# Patient Record
Sex: Female | Born: 1955 | Race: White | Hispanic: No | Smoking: Former smoker
Health system: Southern US, Community
[De-identification: ages and names within clinical notes are randomized; demographics above are authoritative.]

## PROBLEM LIST (undated history)

## (undated) DIAGNOSIS — F32A Depression, unspecified: Secondary | ICD-10-CM

## (undated) DIAGNOSIS — F449 Dissociative and conversion disorder, unspecified: Secondary | ICD-10-CM

## (undated) DIAGNOSIS — K859 Acute pancreatitis without necrosis or infection, unspecified: Secondary | ICD-10-CM

## (undated) DIAGNOSIS — E7800 Pure hypercholesterolemia, unspecified: Secondary | ICD-10-CM

## (undated) DIAGNOSIS — F419 Anxiety disorder, unspecified: Secondary | ICD-10-CM

## (undated) DIAGNOSIS — F329 Major depressive disorder, single episode, unspecified: Secondary | ICD-10-CM

## (undated) DIAGNOSIS — F0781 Postconcussional syndrome: Secondary | ICD-10-CM

## (undated) DIAGNOSIS — H269 Unspecified cataract: Secondary | ICD-10-CM

## (undated) DIAGNOSIS — E039 Hypothyroidism, unspecified: Secondary | ICD-10-CM

## (undated) DIAGNOSIS — E119 Type 2 diabetes mellitus without complications: Secondary | ICD-10-CM

## (undated) DIAGNOSIS — T7840XA Allergy, unspecified, initial encounter: Secondary | ICD-10-CM

## (undated) DIAGNOSIS — K219 Gastro-esophageal reflux disease without esophagitis: Secondary | ICD-10-CM

## (undated) HISTORY — DX: Unspecified cataract: H26.9

## (undated) HISTORY — DX: Gastro-esophageal reflux disease without esophagitis: K21.9

## (undated) HISTORY — DX: Anxiety disorder, unspecified: F41.9

## (undated) HISTORY — DX: Allergy, unspecified, initial encounter: T78.40XA

## (undated) HISTORY — PX: OVARY SURGERY: SHX727

## (undated) HISTORY — PX: CHOLECYSTECTOMY: SHX55

## (undated) HISTORY — DX: Type 2 diabetes mellitus without complications: E11.9

## (undated) HISTORY — DX: Depression, unspecified: F32.A

## (undated) HISTORY — DX: Major depressive disorder, single episode, unspecified: F32.9

---

## 2016-12-09 ENCOUNTER — Encounter (HOSPITAL_COMMUNITY): Payer: Self-pay | Admitting: Emergency Medicine

## 2016-12-09 ENCOUNTER — Inpatient Hospital Stay (HOSPITAL_COMMUNITY)
Admission: EM | Admit: 2016-12-09 | Discharge: 2016-12-16 | DRG: 417 | Disposition: A | Discharge status: Home Health | Payer: Self-pay | Attending: Internal Medicine | Admitting: Internal Medicine

## 2016-12-09 DIAGNOSIS — D252 Subserosal leiomyoma of uterus: Secondary | ICD-10-CM | POA: Diagnosis present

## 2016-12-09 DIAGNOSIS — Z23 Encounter for immunization: Secondary | ICD-10-CM

## 2016-12-09 DIAGNOSIS — K838 Other specified diseases of biliary tract: Secondary | ICD-10-CM

## 2016-12-09 DIAGNOSIS — E785 Hyperlipidemia, unspecified: Secondary | ICD-10-CM | POA: Diagnosis present

## 2016-12-09 DIAGNOSIS — Z419 Encounter for procedure for purposes other than remedying health state, unspecified: Secondary | ICD-10-CM

## 2016-12-09 DIAGNOSIS — K8050 Calculus of bile duct without cholangitis or cholecystitis without obstruction: Secondary | ICD-10-CM

## 2016-12-09 DIAGNOSIS — K8000 Calculus of gallbladder with acute cholecystitis without obstruction: Secondary | ICD-10-CM

## 2016-12-09 DIAGNOSIS — Z880 Allergy status to penicillin: Secondary | ICD-10-CM

## 2016-12-09 DIAGNOSIS — F329 Major depressive disorder, single episode, unspecified: Secondary | ICD-10-CM | POA: Diagnosis present

## 2016-12-09 DIAGNOSIS — K219 Gastro-esophageal reflux disease without esophagitis: Secondary | ICD-10-CM | POA: Diagnosis present

## 2016-12-09 DIAGNOSIS — K859 Acute pancreatitis without necrosis or infection, unspecified: Secondary | ICD-10-CM | POA: Diagnosis present

## 2016-12-09 DIAGNOSIS — Z79890 Hormone replacement therapy (postmenopausal): Secondary | ICD-10-CM

## 2016-12-09 DIAGNOSIS — L0291 Cutaneous abscess, unspecified: Secondary | ICD-10-CM

## 2016-12-09 DIAGNOSIS — K8067 Calculus of gallbladder and bile duct with acute and chronic cholecystitis with obstruction: Secondary | ICD-10-CM | POA: Diagnosis present

## 2016-12-09 DIAGNOSIS — F419 Anxiety disorder, unspecified: Secondary | ICD-10-CM | POA: Diagnosis present

## 2016-12-09 DIAGNOSIS — K920 Hematemesis: Secondary | ICD-10-CM | POA: Diagnosis present

## 2016-12-09 DIAGNOSIS — R17 Unspecified jaundice: Secondary | ICD-10-CM

## 2016-12-09 DIAGNOSIS — K851 Biliary acute pancreatitis without necrosis or infection: Principal | ICD-10-CM | POA: Diagnosis present

## 2016-12-09 DIAGNOSIS — Z6831 Body mass index (BMI) 31.0-31.9, adult: Secondary | ICD-10-CM

## 2016-12-09 DIAGNOSIS — E039 Hypothyroidism, unspecified: Secondary | ICD-10-CM | POA: Diagnosis present

## 2016-12-09 DIAGNOSIS — E669 Obesity, unspecified: Secondary | ICD-10-CM | POA: Diagnosis present

## 2016-12-09 DIAGNOSIS — K9189 Other postprocedural complications and disorders of digestive system: Secondary | ICD-10-CM | POA: Diagnosis not present

## 2016-12-09 HISTORY — DX: Hypothyroidism, unspecified: E03.9

## 2016-12-09 MED ORDER — SODIUM CHLORIDE 0.9 % IV BOLUS (SEPSIS)
1000.0000 mL | Freq: Once | INTRAVENOUS | Status: AC
  Administered 2016-12-10: 1000 mL via INTRAVENOUS

## 2016-12-09 MED ORDER — ONDANSETRON 4 MG PO TBDP
4.0000 mg | ORAL_TABLET | Freq: Once | ORAL | Status: AC | PRN
  Administered 2016-12-09: 4 mg via ORAL
  Filled 2016-12-09: qty 1

## 2016-12-09 NOTE — ED Provider Notes (Signed)
[facility] DEPT Provider Note   CSN: [id number] Arrival date & time: 12/09/16  2134     History   Chief Complaint Chief Complaint  Patient presents with  . Weakness  . Jaundice    HPI Andrea Blackwell is a 61 y.o. female here presenting with jaundice, abdominal pain, itchiness.  For the last 2 weeks, patient has progressive jaundice as well as diffuse itchiness.  She also lost about 15 pounds that is unintentional.  Patient saw primary care doctor about a week ago and finished a course of Bactrim.  Patient was also started on Lexapro over the last week or so.  Patient has some subjective shortness of breath as well but denies any vomiting or fevers.  Patient denies any recent travel or bad food.  Patient has no history of cancer in the past.  The history is provided by the patient.    History reviewed. No pertinent past medical history.  There are no active problems to display for this patient.   Past Surgical History:  Procedure Laterality Date  . OVARY SURGERY      OB History    No data available       Home Medications    Prior to Admission medications   Not on File    Family History History reviewed. No pertinent family history.  Social History Social History  Substance Use Topics  . Smoking status: Never Smoker  . Smokeless tobacco: Never Used  . Alcohol use No     Allergies   Penicillins   Review of Systems Review of Systems  Gastrointestinal: Positive for abdominal pain.  Neurological: Positive for weakness.  All other systems reviewed and are negative.    Physical Exam Updated Vital Signs BP 139/74 (BP Location: Left Arm)   Pulse 69   Temp 97.9 F (36.6 C) (Oral)   Resp 20   Ht 5' 6.5" (1.689 m)   Wt 90.3 kg (199 lb)   SpO2 96%   BMI 31.64 kg/m   Physical Exam  Constitutional: She is oriented to person, place, and time. She appears well-developed.  HENT:  Head: Normocephalic.  Mouth/Throat: Oropharynx  is clear and moist.  Eyes: Pupils are equal, round, and reactive to light.  Jaundice   Neck: Normal range of motion. Neck supple.  Cardiovascular: Normal rate, regular rhythm and normal heart sounds.   Pulmonary/Chest: Effort normal and breath sounds normal. No respiratory distress. She has no wheezes. She has no rales.  Abdominal: Soft.  + epigastric tenderness, no murphy's sign   Musculoskeletal: Normal range of motion.  Neurological: She is alert and oriented to person, place, and time. No cranial nerve deficit. Coordination normal.  Skin: Skin is warm.  Psychiatric: She has a normal mood and affect.  Nursing note and vitals reviewed.    ED Treatments / Results  Labs (all labs ordered are listed, but only abnormal results are displayed) Labs Reviewed  LIPASE, BLOOD  COMPREHENSIVE METABOLIC PANEL  CBC  URINALYSIS, ROUTINE W REFLEX MICROSCOPIC    EKG  EKG Interpretation None       Radiology No results found.  Procedures Procedures (including critical care time)  Medications Ordered in ED Medications  sodium chloride 0.9 % bolus 1,000 mL (not administered)  ondansetron (ZOFRAN-ODT) disintegrating tablet 4 mg (4 mg Oral Given 12/09/16 2347)     Initial Impression / Assessment and Plan / ED Course  I have reviewed the triage vital signs and the nursing notes.  Pertinent  labs & imaging results that were available during my care of the patient were reviewed by me and considered in my medical decision making (see chart for details).     Andrea Blackwell is a 61 y.o. female here with abdominal pain, jaundice, weight loss. Concerned for possible cholangiocarcinoma vs pancreatic cancer, less likely hepatitis or drug reaction from lexapro or bactrim. Will get CBC, CMP, CT ab/pel, UA.   3:21 AM AST 228, ALT 505, Alk Phos 281, Bili 13. Lipase 2000. CT showed choledocholithiasis with dilated CBD and intra hepatic biliary dilation. Likely had choledocholithiasis with pancreatitis.  Hospitalist to admit. Dr. Benson Norway from GI consulted regarding ERCP and he will see patient in AM. Will keep NPO.   Final Clinical Impressions(s) / ED Diagnoses   Final diagnoses:  None    New Prescriptions New Prescriptions   No medications on file     Drenda Freeze, MD 12/10/16 [phone]

## 2016-12-09 NOTE — ED Triage Notes (Signed)
Patient is complaining of vomiting blood and urinating blood. Patient has pain in abdominal area. Patient states she is weak and nauseas. Patient skins is yellow. Patient also has pain her lower back.

## 2016-12-10 ENCOUNTER — Emergency Department (HOSPITAL_COMMUNITY): Payer: Self-pay

## 2016-12-10 ENCOUNTER — Encounter (HOSPITAL_COMMUNITY): Payer: Self-pay | Admitting: Radiology

## 2016-12-10 ENCOUNTER — Inpatient Hospital Stay (HOSPITAL_COMMUNITY): Payer: Self-pay | Admitting: Certified Registered Nurse Anesthetist

## 2016-12-10 ENCOUNTER — Inpatient Hospital Stay (HOSPITAL_COMMUNITY): Payer: Self-pay

## 2016-12-10 ENCOUNTER — Encounter (HOSPITAL_COMMUNITY)
Admission: EM | Disposition: A | Discharge status: Home Health | Payer: Self-pay | Source: Home / Self Care | Attending: Internal Medicine

## 2016-12-10 DIAGNOSIS — K851 Biliary acute pancreatitis without necrosis or infection: Principal | ICD-10-CM

## 2016-12-10 DIAGNOSIS — K859 Acute pancreatitis without necrosis or infection, unspecified: Secondary | ICD-10-CM | POA: Diagnosis present

## 2016-12-10 HISTORY — PX: ERCP: SHX5425

## 2016-12-10 LAB — URINALYSIS, ROUTINE W REFLEX MICROSCOPIC
GLUCOSE, UA: NEGATIVE mg/dL
KETONES UR: NEGATIVE mg/dL
NITRITE: NEGATIVE
PH: 5 (ref 5.0–8.0)
Protein, ur: NEGATIVE mg/dL
Specific Gravity, Urine: 1.011 (ref 1.005–1.030)

## 2016-12-10 LAB — COMPREHENSIVE METABOLIC PANEL
ALBUMIN: 3.6 g/dL (ref 3.5–5.0)
ALT: 384 U/L — AB (ref 14–54)
ALT: 505 U/L — ABNORMAL HIGH (ref 14–54)
ANION GAP: 10 (ref 5–15)
AST: 175 U/L — AB (ref 15–41)
AST: 228 U/L — ABNORMAL HIGH (ref 15–41)
Albumin: 2.8 g/dL — ABNORMAL LOW (ref 3.5–5.0)
Alkaline Phosphatase: 228 U/L — ABNORMAL HIGH (ref 38–126)
Alkaline Phosphatase: 281 U/L — ABNORMAL HIGH (ref 38–126)
Anion gap: 9 (ref 5–15)
BILIRUBIN TOTAL: 13.6 mg/dL — AB (ref 0.3–1.2)
BILIRUBIN TOTAL: 9.5 mg/dL — AB (ref 0.3–1.2)
BUN: 12 mg/dL (ref 6–20)
BUN: 9 mg/dL (ref 6–20)
CALCIUM: 9.6 mg/dL (ref 8.9–10.3)
CO2: 23 mmol/L (ref 22–32)
CO2: 24 mmol/L (ref 22–32)
CREATININE: 0.4 mg/dL — AB (ref 0.44–1.00)
Calcium: 8.7 mg/dL — ABNORMAL LOW (ref 8.9–10.3)
Chloride: 103 mmol/L (ref 101–111)
Chloride: 107 mmol/L (ref 101–111)
Creatinine, Ser: 0.36 mg/dL — ABNORMAL LOW (ref 0.44–1.00)
GFR calc Af Amer: >60 mL/min (ref >60)
GFR calc non Af Amer: >60 mL/min (ref >60)
Glucose, Bld: 147 mg/dL — ABNORMAL HIGH (ref 65–99)
Glucose, Bld: 94 mg/dL (ref 65–99)
Potassium: 3.8 mmol/L (ref 3.5–5.1)
Potassium: 4.1 mmol/L (ref 3.5–5.1)
SODIUM: 136 mmol/L (ref 135–145)
Sodium: 140 mmol/L (ref 135–145)
TOTAL PROTEIN: 5.9 g/dL — AB (ref 6.5–8.1)
TOTAL PROTEIN: 7.3 g/dL (ref 6.5–8.1)

## 2016-12-10 LAB — CBG MONITORING, ED: GLUCOSE-CAPILLARY: 87 mg/dL (ref 65–99)

## 2016-12-10 LAB — CBC
HCT: 36.5 % (ref 36.0–46.0)
HCT: 36.9 % (ref 36.0–46.0)
HCT: 43.1 % (ref 36.0–46.0)
HEMOGLOBIN: 14.4 g/dL (ref 12.0–15.0)
Hemoglobin: 12.2 g/dL (ref 12.0–15.0)
Hemoglobin: 12.2 g/dL (ref 12.0–15.0)
MCH: 30.4 pg (ref 26.0–34.0)
MCH: 30.6 pg (ref 26.0–34.0)
MCH: 30.7 pg (ref 26.0–34.0)
MCHC: 33.1 g/dL (ref 30.0–36.0)
MCHC: 33.4 g/dL (ref 30.0–36.0)
MCHC: 33.4 g/dL (ref 30.0–36.0)
MCV: 91.5 fL (ref 78.0–100.0)
MCV: 91.7 fL (ref 78.0–100.0)
MCV: 92 fL (ref 78.0–100.0)
PLATELETS: 223 10*3/uL (ref 150–400)
Platelets: 221 10*3/uL (ref 150–400)
Platelets: 280 10*3/uL (ref 150–400)
RBC: 3.98 MIL/uL (ref 3.87–5.11)
RBC: 4.01 MIL/uL (ref 3.87–5.11)
RBC: 4.71 MIL/uL (ref 3.87–5.11)
RDW: 14.2 % (ref 11.5–15.5)
RDW: 14.3 % (ref 11.5–15.5)
RDW: 14.5 % (ref 11.5–15.5)
WBC: 3.8 10*3/uL — AB (ref 4.0–10.5)
WBC: 4.9 10*3/uL (ref 4.0–10.5)
WBC: 8.7 10*3/uL (ref 4.0–10.5)

## 2016-12-10 LAB — LACTATE DEHYDROGENASE: LDH: 152 U/L (ref 98–192)

## 2016-12-10 LAB — TSH: TSH: 0.578 u[IU]/mL (ref 0.350–4.500)

## 2016-12-10 LAB — ACETAMINOPHEN LEVEL: Acetaminophen (Tylenol), Serum: <10 ug/mL — ABNORMAL LOW (ref 10–30)

## 2016-12-10 LAB — LIPID PANEL
Cholesterol: 279 mg/dL — ABNORMAL HIGH (ref 0–200)
HDL: 21 mg/dL — ABNORMAL LOW (ref >40)
LDL CALC: 216 mg/dL — AB (ref 0–99)
Total CHOL/HDL Ratio: 13.3 RATIO
Triglycerides: 209 mg/dL — ABNORMAL HIGH (ref <150)
VLDL: 42 mg/dL — AB (ref 0–40)

## 2016-12-10 LAB — SAMPLE TO BLOOD BANK

## 2016-12-10 LAB — ETHANOL: Alcohol, Ethyl (B): <10 mg/dL (ref <10)

## 2016-12-10 LAB — SALICYLATE LEVEL: Salicylate Lvl: <7 mg/dL (ref 2.8–30.0)

## 2016-12-10 LAB — LIPASE, BLOOD
LIPASE: 2778 U/L — AB (ref 11–51)
LIPASE: 1764 U/L — AB (ref 11–51)

## 2016-12-10 LAB — TROPONIN I: Troponin I: <0.03 ng/mL (ref <0.03)

## 2016-12-10 LAB — PROTIME-INR
INR: 0.93
PROTHROMBIN TIME: 12.4 s (ref 11.4–15.2)

## 2016-12-10 SURGERY — ERCP, WITH INTERVENTION IF INDICATED
Anesthesia: General

## 2016-12-10 MED ORDER — MORPHINE SULFATE (PF) 4 MG/ML IV SOLN
4.0000 mg | Freq: Once | INTRAVENOUS | Status: AC
  Administered 2016-12-10: 4 mg via INTRAVENOUS
  Filled 2016-12-10: qty 1

## 2016-12-10 MED ORDER — ONDANSETRON HCL 4 MG/2ML IJ SOLN
INTRAMUSCULAR | Status: DC | PRN
  Administered 2016-12-10: 4 mg via INTRAVENOUS

## 2016-12-10 MED ORDER — DEXAMETHASONE SODIUM PHOSPHATE 10 MG/ML IJ SOLN
INTRAMUSCULAR | Status: AC
  Filled 2016-12-10: qty 1

## 2016-12-10 MED ORDER — SUCCINYLCHOLINE CHLORIDE 200 MG/10ML IV SOSY
PREFILLED_SYRINGE | INTRAVENOUS | Status: DC | PRN
  Administered 2016-12-10: 100 mg via INTRAVENOUS

## 2016-12-10 MED ORDER — SODIUM CHLORIDE 0.9 % IV SOLN
INTRAVENOUS | Status: AC
  Administered 2016-12-10 – 2016-12-12 (×7): via INTRAVENOUS

## 2016-12-10 MED ORDER — PANTOPRAZOLE SODIUM 40 MG IV SOLR
80.0000 mg | Freq: Once | INTRAVENOUS | Status: AC
  Administered 2016-12-10: 10:00:00 80 mg via INTRAVENOUS
  Filled 2016-12-10: qty 80

## 2016-12-10 MED ORDER — SODIUM CHLORIDE 0.9 % IV SOLN: INTRAVENOUS | Status: DC

## 2016-12-10 MED ORDER — IOPAMIDOL (ISOVUE-300) INJECTION 61%
INTRAVENOUS | Status: AC
  Filled 2016-12-10: qty 100

## 2016-12-10 MED ORDER — ONDANSETRON HCL 4 MG/2ML IJ SOLN: 4.0000 mg | Freq: Once | INTRAMUSCULAR | Status: DC | PRN

## 2016-12-10 MED ORDER — PROPOFOL 10 MG/ML IV BOLUS
INTRAVENOUS | Status: DC | PRN
  Administered 2016-12-10: 150 mg via INTRAVENOUS

## 2016-12-10 MED ORDER — INDOMETHACIN 50 MG RE SUPP
RECTAL | Status: DC | PRN
  Administered 2016-12-10: 100 mg via RECTAL

## 2016-12-10 MED ORDER — FENTANYL CITRATE (PF) 100 MCG/2ML IJ SOLN
INTRAMUSCULAR | Status: AC
  Filled 2016-12-10: qty 2

## 2016-12-10 MED ORDER — INDOMETHACIN 50 MG RE SUPP
RECTAL | Status: AC
  Filled 2016-12-10: qty 2

## 2016-12-10 MED ORDER — CIPROFLOXACIN IN D5W 400 MG/200ML IV SOLN
INTRAVENOUS | Status: AC
  Filled 2016-12-10: qty 200

## 2016-12-10 MED ORDER — SODIUM CHLORIDE 0.9 % IV BOLUS (SEPSIS)
1000.0000 mL | Freq: Once | INTRAVENOUS | Status: AC
  Administered 2016-12-10: 1000 mL via INTRAVENOUS

## 2016-12-10 MED ORDER — ONDANSETRON HCL 4 MG/2ML IJ SOLN
INTRAMUSCULAR | Status: AC
  Filled 2016-12-10: qty 2

## 2016-12-10 MED ORDER — IOPAMIDOL (ISOVUE-300) INJECTION 61%
100.0000 mL | Freq: Once | INTRAVENOUS | Status: AC | PRN
  Administered 2016-12-10: 100 mL via INTRAVENOUS

## 2016-12-10 MED ORDER — DEXAMETHASONE SODIUM PHOSPHATE 10 MG/ML IJ SOLN
INTRAMUSCULAR | Status: DC | PRN
  Administered 2016-12-10: 10 mg via INTRAVENOUS

## 2016-12-10 MED ORDER — SODIUM CHLORIDE 0.9 % IV SOLN
8.0000 mg/h | INTRAVENOUS | Status: DC
  Administered 2016-12-10 – 2016-12-11 (×3): 8 mg/h via INTRAVENOUS
  Filled 2016-12-10 (×5): qty 80

## 2016-12-10 MED ORDER — INFLUENZA VAC SPLIT QUAD 0.5 ML IM SUSY
0.5000 mL | PREFILLED_SYRINGE | INTRAMUSCULAR | Status: AC
  Administered 2016-12-15: 0.5 mL via INTRAMUSCULAR
  Filled 2016-12-10: qty 0.5

## 2016-12-10 MED ORDER — OXYCODONE HCL 5 MG/5ML PO SOLN: 5.0000 mg | Freq: Once | ORAL | Status: DC | PRN

## 2016-12-10 MED ORDER — SUCCINYLCHOLINE CHLORIDE 200 MG/10ML IV SOSY
PREFILLED_SYRINGE | INTRAVENOUS | Status: AC
  Filled 2016-12-10: qty 10

## 2016-12-10 MED ORDER — LACTATED RINGERS IV SOLN
INTRAVENOUS | Status: DC | PRN
  Administered 2016-12-10: 12:00:00 via INTRAVENOUS

## 2016-12-10 MED ORDER — LIDOCAINE 2% (20 MG/ML) 5 ML SYRINGE
INTRAMUSCULAR | Status: DC | PRN
  Administered 2016-12-10: 60 mg via INTRAVENOUS

## 2016-12-10 MED ORDER — OXYCODONE HCL 5 MG PO TABS: 5.0000 mg | ORAL_TABLET | Freq: Once | ORAL | Status: DC | PRN

## 2016-12-10 MED ORDER — MIDAZOLAM HCL 5 MG/5ML IJ SOLN
INTRAMUSCULAR | Status: DC | PRN
Start: 2016-12-10 — End: 2016-12-10
  Administered 2016-12-10: 1 mg via INTRAVENOUS

## 2016-12-10 MED ORDER — ROCURONIUM BROMIDE 50 MG/5ML IV SOSY
PREFILLED_SYRINGE | INTRAVENOUS | Status: AC
  Filled 2016-12-10: qty 5

## 2016-12-10 MED ORDER — FENTANYL CITRATE (PF) 100 MCG/2ML IJ SOLN: 25.0000 ug | INTRAMUSCULAR | Status: DC | PRN

## 2016-12-10 MED ORDER — SODIUM CHLORIDE 0.9 % IV SOLN
INTRAVENOUS | Status: DC | PRN
  Administered 2016-12-10: 30 mL

## 2016-12-10 MED ORDER — LIDOCAINE 2% (20 MG/ML) 5 ML SYRINGE
INTRAMUSCULAR | Status: AC
  Filled 2016-12-10: qty 5

## 2016-12-10 MED ORDER — LEVOTHYROXINE SODIUM 75 MCG PO TABS
75.0000 ug | ORAL_TABLET | Freq: Every day | ORAL | Status: DC
  Administered 2016-12-10 – 2016-12-16 (×7): 75 ug via ORAL
  Filled 2016-12-10 (×7): qty 1

## 2016-12-10 MED ORDER — MORPHINE SULFATE (PF) 4 MG/ML IV SOLN
1.0000 mg | INTRAVENOUS | Status: DC | PRN
  Administered 2016-12-10 – 2016-12-12 (×5): 1 mg via INTRAVENOUS
  Filled 2016-12-10 (×5): qty 1

## 2016-12-10 MED ORDER — PROPOFOL 10 MG/ML IV BOLUS
INTRAVENOUS | Status: AC
  Filled 2016-12-10: qty 20

## 2016-12-10 MED ORDER — CIPROFLOXACIN IN D5W 400 MG/200ML IV SOLN
400.0000 mg | Freq: Two times a day (BID) | INTRAVENOUS | Status: DC
  Administered 2016-12-10 – 2016-12-14 (×11): 400 mg via INTRAVENOUS
  Filled 2016-12-10 (×11): qty 200

## 2016-12-10 MED ORDER — FENTANYL CITRATE (PF) 100 MCG/2ML IJ SOLN
INTRAMUSCULAR | Status: DC | PRN
  Administered 2016-12-10 (×2): 50 ug via INTRAVENOUS

## 2016-12-10 MED ORDER — MIDAZOLAM HCL 2 MG/2ML IJ SOLN
INTRAMUSCULAR | Status: AC
  Filled 2016-12-10: qty 2

## 2016-12-10 MED ORDER — ONDANSETRON HCL 4 MG/2ML IJ SOLN
4.0000 mg | Freq: Four times a day (QID) | INTRAMUSCULAR | Status: DC | PRN
Start: 2016-12-10 — End: 2016-12-16
  Administered 2016-12-10 – 2016-12-16 (×5): 4 mg via INTRAVENOUS
  Filled 2016-12-10 (×5): qty 2

## 2016-12-10 MED ORDER — ESCITALOPRAM OXALATE 10 MG PO TABS
10.0000 mg | ORAL_TABLET | Freq: Every day | ORAL | Status: DC
  Administered 2016-12-10 – 2016-12-15 (×6): 10 mg via ORAL
  Filled 2016-12-10 (×6): qty 1

## 2016-12-10 NOTE — H&P (View-Only) (Signed)
Consult Note for [facility]  Reason for Consult: Choledocholithiasis Referring Physician: [facility]  Andrea Blackwell HPI: This is a 61 year old female with a PMH of hypothyroidism admitted for complaints of hematemesis, nausea, vomiting, and jaundice.  Her symptoms started two weeks ago.  At that time she admits to an eating binge as she is an emotional eater.  As a result she states that her abdomen "exploded" and she felt ill with pain.  She slept from Friday evening up until Sunday morning, per her report.  Over the interval time period she continued to have abdominal pain.  She noted that her urine was orange in color and it progressively worsened.  The event that brought her in to the hospital was the one bout of hematemesis when she had nausea and vomiting.  In the ER she was noted to have a gallstone pancreatitis and there is a distal obstructing stone in the CBD.  As a result of her symptoms she reports a 15 lbs weight loss.  Past Medical History:  Diagnosis Date  . Hypothyroidism     Past Surgical History:  Procedure Laterality Date  . OVARY SURGERY      History reviewed. No pertinent family history.  Social History:  reports that she has never smoked. She has never used smokeless tobacco. She reports that she does not drink alcohol or use drugs.  Allergies:  Allergies  Allergen Reactions  . Penicillins Anaphylaxis    Has patient had a PCN reaction causing immediate rash, facial/tongue/throat swelling, SOB or lightheadedness with hypotension: yes Has patient had a PCN reaction causing severe rash involving mucus membranes or skin necrosis: no Has patient had a PCN reaction that required hospitalization: yes Has patient had a PCN reaction occurring within the last 10 years: no If all of the above answers are "NO", then may proceed with Cephalosporin use.     Medications:  Scheduled: . iopamidol       Continuous: . pantoprazole (PROTONIX) IVPB 80 mg (12/10/16 0939)  .  pantoprozole (PROTONIX) infusion 8 mg/hr (12/10/16 0939)    Results for orders placed or performed during the hospital encounter of 12/09/16 (from the past 24 hour(s))  Comprehensive metabolic panel     Status: Abnormal   Collection Time: 12/09/16 11:53 PM  Result Value Ref Range   Sodium 136 135 - 145 mmol/L   Potassium 4.1 3.5 - 5.1 mmol/L   Chloride 103 101 - 111 mmol/L   CO2 23 22 - 32 mmol/L   Glucose, Bld 147 (H) 65 - 99 mg/dL   BUN 12 6 - 20 mg/dL   Creatinine, Ser 0.36 (L) 0.44 - 1.00 mg/dL   Calcium 9.6 8.9 - 10.3 mg/dL   Total Protein 7.3 6.5 - 8.1 g/dL   Albumin 3.6 3.5 - 5.0 g/dL   AST 228 (H) 15 - 41 U/L   ALT 505 (H) 14 - 54 U/L   Alkaline Phosphatase 281 (H) 38 - 126 U/L   Total Bilirubin 13.6 (H) 0.3 - 1.2 mg/dL   GFR calc non Af Amer >60 >60 mL/min   GFR calc Af Amer >60 >60 mL/min   Anion gap 10 5 - 15  CBC     Status: None   Collection Time: 12/09/16 11:53 PM  Result Value Ref Range   WBC 8.7 4.0 - 10.5 K/uL   RBC 4.71 3.87 - 5.11 MIL/uL   Hemoglobin 14.4 12.0 - 15.0 g/dL   HCT 43.1 36.0 - 46.0 %  MCV 91.5 78.0 - 100.0 fL   MCH 30.6 26.0 - 34.0 pg   MCHC 33.4 30.0 - 36.0 g/dL   RDW 14.2 11.5 - 15.5 %   Platelets 280 150 - 400 K/uL  Acetaminophen level     Status: Abnormal   Collection Time: 12/09/16 11:53 PM  Result Value Ref Range   Acetaminophen (Tylenol), Serum <10 (L) 10 - 30 ug/mL  Ethanol     Status: None   Collection Time: 12/09/16 11:53 PM  Result Value Ref Range   Alcohol, Ethyl (B) <38 <10 mg/dL  Salicylate level     Status: None   Collection Time: 12/09/16 11:53 PM  Result Value Ref Range   Salicylate Lvl <1.7 2.8 - 30.0 mg/dL  Sample to Blood Bank     Status: None   Collection Time: 12/09/16 11:53 PM  Result Value Ref Range   Blood Bank Specimen SAMPLE AVAILABLE FOR TESTING    Sample Expiration 12/12/2016   Urinalysis, Routine w reflex microscopic     Status: Abnormal   Collection Time: 12/09/16 11:55 PM  Result Value Ref Range    Color, Urine AMBER (A) YELLOW   APPearance CLEAR CLEAR   Specific Gravity, Urine 1.011 1.005 - 1.030   pH 5.0 5.0 - 8.0   Glucose, UA NEGATIVE NEGATIVE mg/dL   Hgb urine dipstick MODERATE (A) NEGATIVE   Bilirubin Urine SMALL (A) NEGATIVE   Ketones, ur NEGATIVE NEGATIVE mg/dL   Protein, ur NEGATIVE NEGATIVE mg/dL   Nitrite NEGATIVE NEGATIVE   Leukocytes, UA TRACE (A) NEGATIVE   RBC / HPF 6-30 0 - 5 RBC/hpf   WBC, UA 0-5 0 - 5 WBC/hpf   Bacteria, UA RARE (A) NONE SEEN   Squamous Epithelial / LPF 0-5 (A) NONE SEEN   Mucus PRESENT    Hyaline Casts, UA PRESENT   Troponin I     Status: None   Collection Time: 12/10/16 12:01 AM  Result Value Ref Range   Troponin I <0.03 <0.03 ng/mL  Lipase, blood     Status: Abnormal   Collection Time: 12/10/16  1:25 AM  Result Value Ref Range   Lipase 2,778 (H) 11 - 51 U/L  TSH     Status: None   Collection Time: 12/10/16  5:01 AM  Result Value Ref Range   TSH 0.578 0.350 - 4.500 uIU/mL  CBC     Status: None   Collection Time: 12/10/16  5:01 AM  Result Value Ref Range   WBC 4.9 4.0 - 10.5 K/uL   RBC 4.01 3.87 - 5.11 MIL/uL   Hemoglobin 12.2 12.0 - 15.0 g/dL   HCT 36.9 36.0 - 46.0 %   MCV 92.0 78.0 - 100.0 fL   MCH 30.4 26.0 - 34.0 pg   MCHC 33.1 30.0 - 36.0 g/dL   RDW 14.3 11.5 - 15.5 %   Platelets 223 150 - 400 K/uL  Lipase, blood     Status: Abnormal   Collection Time: 12/10/16  5:01 AM  Result Value Ref Range   Lipase 1,764 (H) 11 - 51 U/L  Lactate dehydrogenase     Status: None   Collection Time: 12/10/16  5:01 AM  Result Value Ref Range   LDH 152 98 - 192 U/L  CBG monitoring, ED     Status: None   Collection Time: 12/10/16  9:09 AM  Result Value Ref Range   Glucose-Capillary 87 65 - 99 mg/dL     Dg Chest 2 View  Result  Date: 12/10/2016 CLINICAL DATA:  61 year old female with shortness of breath. EXAM: CHEST  2 VIEW COMPARISON:  None. FINDINGS: The lungs are clear. There is no pleural effusion or pneumothorax. The cardiac  silhouette is within normal limits. There is no acute osseous pathology. IMPRESSION: No active cardiopulmonary disease. Electronically Signed   By: Anner Crete M.D.   On: 12/10/2016 01:05   Ct Abdomen Pelvis W Contrast  Result Date: 12/10/2016 CLINICAL DATA:  Unintended weight loss. Abdominal pain. Weakness and nausea. Jaundice. Back pain. Elevated liver function tests. EXAM: CT ABDOMEN AND PELVIS WITH CONTRAST TECHNIQUE: Multidetector CT imaging of the abdomen and pelvis was performed using the standard protocol following bolus administration of intravenous contrast. CONTRAST:  156mL ISOVUE-300 IOPAMIDOL (ISOVUE-300) INJECTION 61% COMPARISON:  None. FINDINGS: LOWER CHEST: Dependent atelectasis. Included heart size is normal. No pericardial effusion. HEPATOBILIARY: Mild intrahepatic biliary dilatation. Liver is otherwise unremarkable. Distended gallbladder, subcentimeter layering faint suspected gallstones. Dilated cystic duct. Proximal Common bile duct is 9 mm. Subcentimeter intermediate density filling defect distal Common bile duct (coronal 87/191, sagittal 92/191). PANCREAS: Normal. SPLEEN: Normal. ADRENALS/URINARY TRACT: Kidneys are orthotopic, demonstrating symmetric enhancement. No nephrolithiasis, hydronephrosis or solid renal masses. The unopacified ureters are normal in course and caliber. Delayed imaging through the kidneys demonstrates symmetric prompt contrast excretion within the proximal urinary collecting system. Urinary bladder is partially distended and unremarkable. Normal adrenal glands. STOMACH/BOWEL: The stomach, small and large bowel are normal in course and caliber without inflammatory changes, sensitivity decreased without oral contrast. Mild sigmoid colonic diverticulosis. Normal appendix. VASCULAR/LYMPHATIC: Aortoiliac vessels are normal in course and caliber. Mild calcific atherosclerosis. No lymphadenopathy by CT size criteria. REPRODUCTIVE: 4.8 x 4.6 cm calcified RIGHT pelvic  mass contiguous with RIGHT adnexae and posterior uterus. Small similarly calcified mass within the uterus. OTHER: No intraperitoneal free fluid or free air. MUSCULOSKELETAL: Nonacute. Mild degenerative change of the spine. Small fat containing umbilical hernia. IMPRESSION: 1. Intra and extra hepatic biliary dilatation to the level of the distal CBD were there is an obstructing gallstone, less likely mass. 2. Cholelithiasis without CT findings of acute cholecystitis. 3. 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 4. Acute findings discussed with and reconfirmed by Dr.DAVID YAO on 12/10/2016 at 2:20 am. Electronically Signed   By: Elon Alas M.D.   On: 12/10/2016 02:22    ROS:  As stated above in the HPI otherwise negative.  Blood pressure (!) 145/60, pulse 60, temperature 97.9 F (36.6 C), temperature source Oral, resp. rate 16, height 5' 6.5" (1.689 m), weight 90.3 kg (199 lb), SpO2 94 %.    PE: Gen: NAD, Alert and Oriented HEENT:  [place]/AT, EOMI Neck: Supple, no LAD Lungs: CTA Bilaterally CV: RRR without M/G/R ABM: Soft, mild tenderness, +BS Ext: No C/C/E  Assessment/Plan: 1) Choledocholithiasis. 2) Obstructive liver enzymes pattern. 3) Cholelithiasis. 4) Gallstone pancreatitis. 5) Weight loss. 6) Hematemesis - HGB is stable.   The patient appears to have a distal CBD stone, but her bilirubin is much higher than anticipated for a benign obstructive etiology.  Even though her pancreas appears normal her lipase is markedly elevated.  There is most likely a component of under hydration.  With the CBD stone, further treatment is required with an ERCP.  I discussed with her the risks of bleeding, perforation, and pancreatitis.  She is allergic to PCN.  Cipro will be administered instead.  Stefan Karen D 12/10/2016, 9:36 AM

## 2016-12-10 NOTE — Progress Notes (Signed)
Pharmacy Antibiotic Note  Andrea Blackwell is a 61 y.o. female admitted on 12/09/2016 with intra-abdominal infection and cholecycstitis. Marland Kitchen  Pharmacy has been consulted for cipro dosing.  Plan: cipro 400 mg IV q12 Pharmacy to sign off  Height: 5' 6.5" (168.9 cm) Weight: 199 lb (90.3 kg) IBW/kg (Calculated) : 60.45  Temp (24hrs), Avg:97.9 F (36.6 C), Min:97.9 F (36.6 C), Max:98 F (36.7 C)   Recent Labs Lab 12/09/16 2353 12/10/16 0501 12/10/16 0900  WBC 8.7 4.9 3.8*  CREATININE 0.36*  --  0.40*    Estimated Creatinine Clearance: 86.5 mL/min (A) (by C-G formula based on SCr of 0.4 mg/dL (L)).    Allergies  Allergen Reactions  . Penicillins Anaphylaxis    Has patient had a PCN reaction causing immediate rash, facial/tongue/throat swelling, SOB or lightheadedness with hypotension: yes Has patient had a PCN reaction causing severe rash involving mucus membranes or skin necrosis: no Has patient had a PCN reaction that required hospitalization: yes Has patient had a PCN reaction occurring within the last 10 years: no If all of the above answers are "NO", then may proceed with Cephalosporin use.    Thank you for allowing pharmacy to be a part of this patient's care. Eudelia Bunch, Pharm.D. [phone] 12/10/2016 12:19 PM

## 2016-12-10 NOTE — ED Notes (Signed)
Call report to [place]

## 2016-12-10 NOTE — Op Note (Signed)
[facility] Patient Name: Andrea Blackwell Procedure Date: 12/10/2016 MRN: [id number] Attending MD: Carol Ada , MD Date of Birth: 07-23-1955 CSN: [id number] Age: 62 Admit Type: Inpatient Procedure:                ERCP Indications:              For therapy of bile duct stone(s) Providers:                Carol Ada, MD, Cleda Daub, RN, William Dalton, Technician Referring MD:              Medicines:                General Anesthesia Complications:            No immediate complications. Estimated Blood Loss:     Estimated blood loss was minimal. Procedure:                Pre-Anesthesia Assessment:                           - Prior to the procedure, a History and Physical                            was performed, and patient medications and                            allergies were reviewed. The patient's tolerance of                            previous anesthesia was also reviewed. The risks                            and benefits of the procedure and the sedation                            options and risks were discussed with the patient.                            All questions were answered, and informed consent                            was obtained. Prior Anticoagulants: The patient has                            taken no previous anticoagulant or antiplatelet                            agents. ASA Grade Assessment: II - A patient with                            mild systemic disease. After reviewing the risks  and benefits, the patient was deemed in                            satisfactory condition to undergo the procedure.                           - Sedation was administered by an anesthesia                            professional. General anesthesia was attained.                           After obtaining informed consent, the scope was                            passed under direct vision. Throughout the                             procedure, the patient's blood pressure, pulse, and                            oxygen saturations were monitored continuously. The                            [id number] ([id number]) scope was introduced through                            the mouth, and used to inject contrast into and                            used to cannulate the bile duct. The ERCP was                            accomplished without difficulty. The patient                            tolerated the procedure well. Scope In: Scope Out: Findings:      The major papilla was normal. The bile duct was deeply cannulated with       the short-nosed traction sphincterotome. Contrast was injected. I       personally interpreted the bile duct images. Ductal flow of contrast was       adequate. Contrast extended to the bifurcation. The middle third of the       main bile duct contained two stones, the largest of which was 10 mm in       diameter. The common bile duct was moderately dilated, with a stone       causing an obstruction. The largest diameter was 10 mm. A 10 mm biliary       sphincterotomy was made with a traction (standard) sphincterotome using       ERBE electrocautery. There was no post-sphincterotomy bleeding. The       biliary tree was swept with a 12 mm balloon starting at the bifurcation.       Two stones were removed. No stones remained.      Cannulation  of the CBD was achieved during the first attempt. No       cannulation of the PD occurred. Impression:               - The major papilla appeared normal.                           - The common bile duct was moderately dilated, with                            a stone causing an obstruction.                           - Choledocholithiasis was found. Complete removal                            was accomplished by biliary sphincterotomy and                            balloon extraction.                           - A biliary sphincterotomy was  performed.                           - The biliary tree was swept. Moderate Sedation:      N/A- Per Anesthesia Care Recommendation:           - Return patient to hospital ward for ongoing care.                           - Clear liquid diet.                           - Continue present medications. Procedure Code(s):        --- Professional ---                           [phone], Endoscopic retrograde                            cholangiopancreatography (ERCP); with removal of                            calculi/debris from biliary/pancreatic duct(s)                           43262, Endoscopic retrograde                            cholangiopancreatography (ERCP); with                            sphincterotomy/papillotomy                           05397, Endoscopic catheterization of the biliary  ductal system, radiological supervision and                            interpretation Diagnosis Code(s):        --- Professional ---                           K80.51, Calculus of bile duct without cholangitis                            or cholecystitis with obstruction CPT copyright 2016 [employer]. All rights reserved. The codes documented in this report are preliminary and upon coder review may  be revised to meet current compliance requirements. Carol Ada, MD Carol Ada, MD 12/10/2016 1:16:38 PM This report has been signed electronically. Number of Addenda: 0

## 2016-12-10 NOTE — Consult Note (Signed)
Reason for Consult: Abdominal pain, jaundice, pancreatitis  Referring Physician: Jani Gravel  Coletta Glassner is an 61 y.o. female.  HPI: Patient is a pleasant 61 year old female who was in her usual state of good health until 2 weeks ago.  She states she is an emotional eater and ate a large meal and soon after developed very severe generalized abdominal pain.  She thought it was related to overeating and went to bed.  The pain continued.  She developed nausea.  The pain decreased in intensity but has continued ever since.  It is poorly localized in several areas of the abdomen.  Constant pain.  She has remained nauseated and unable to eat or drink much.  More recently she noted very dark color to her urine and light-colored stools as well as yellow color to her eyes.  She has been very weak and just prior to coming to the hospital did have a near syncopal episode and had trouble getting off the floor.  She vomited prior to coming to the hospital and noted some blood and this prompted her to come to the emergency room.  No fever or chills.  No previous similar symptoms.  Past Medical History:  Diagnosis Date  . Hypothyroidism     Past Surgical History:  Procedure Laterality Date  . OVARY SURGERY      History reviewed. No pertinent family history.  Social History:  reports that she has never smoked. She has never used smokeless tobacco. She reports that she does not drink alcohol or use drugs.  Allergies:  Allergies  Allergen Reactions  . Penicillins Anaphylaxis    Has patient had a PCN reaction causing immediate rash, facial/tongue/throat swelling, SOB or lightheadedness with hypotension: yes Has patient had a PCN reaction causing severe rash involving mucus membranes or skin necrosis: no Has patient had a PCN reaction that required hospitalization: yes Has patient had a PCN reaction occurring within the last 10 years: no If all of the above answers are "NO", then may proceed with Cephalosporin  use.    Current Facility-Administered Medications  Medication Dose Route Frequency Provider Last Rate Last Dose  . iopamidol (ISOVUE-300) 61 % injection           . pantoprazole (PROTONIX) 80 mg in sodium chloride 0.9 % 250 mL (0.32 mg/mL) infusion  8 mg/hr Intravenous Continuous Jani Gravel, MD 25 mL/hr at 12/10/16 0939 8 mg/hr at 12/10/16 1540   Current Outpatient Prescriptions  Medication Sig Dispense Refill  . acetaminophen (TYLENOL) 500 MG tablet Take 500 mg by mouth every 8 (eight) hours as needed (back and stomach pain).    . bismuth subsalicylate (PEPTO BISMOL) 262 MG/15ML suspension Take 30 mLs by mouth every 6 (six) hours as needed for indigestion or diarrhea or loose stools.    Marland Kitchen escitalopram (LEXAPRO) 10 MG tablet Take 10 mg by mouth at bedtime.    Marland Kitchen levothyroxine (SYNTHROID, LEVOTHROID) 75 MCG tablet Take 75 mcg by mouth daily before breakfast.       Results for orders placed or performed during the hospital encounter of 12/09/16 (from the past 48 hour(s))  Comprehensive metabolic panel     Status: Abnormal   Collection Time: 12/09/16 11:53 PM  Result Value Ref Range   Sodium 136 135 - 145 mmol/L   Potassium 4.1 3.5 - 5.1 mmol/L   Chloride 103 101 - 111 mmol/L   CO2 23 22 - 32 mmol/L   Glucose, Bld 147 (H) 65 - 99 mg/dL  BUN 12 6 - 20 mg/dL   Creatinine, Ser 0.36 (L) 0.44 - 1.00 mg/dL   Calcium 9.6 8.9 - 10.3 mg/dL   Total Protein 7.3 6.5 - 8.1 g/dL   Albumin 3.6 3.5 - 5.0 g/dL   AST 228 (H) 15 - 41 U/L   ALT 505 (H) 14 - 54 U/L   Alkaline Phosphatase 281 (H) 38 - 126 U/L   Total Bilirubin 13.6 (H) 0.3 - 1.2 mg/dL   GFR calc non Af Amer >60 >60 mL/min   GFR calc Af Amer >60 >60 mL/min    Comment: (NOTE) The eGFR has been calculated using the CKD EPI equation. This calculation has not been validated in all clinical situations. eGFR's persistently <60 mL/min signify possible Chronic Kidney Disease.    Anion gap 10 5 - 15  CBC     Status: None   Collection Time:  12/09/16 11:53 PM  Result Value Ref Range   WBC 8.7 4.0 - 10.5 K/uL   RBC 4.71 3.87 - 5.11 MIL/uL   Hemoglobin 14.4 12.0 - 15.0 g/dL   HCT 43.1 36.0 - 46.0 %   MCV 91.5 78.0 - 100.0 fL   MCH 30.6 26.0 - 34.0 pg   MCHC 33.4 30.0 - 36.0 g/dL   RDW 14.2 11.5 - 15.5 %   Platelets 280 150 - 400 K/uL  Acetaminophen level     Status: Abnormal   Collection Time: 12/09/16 11:53 PM  Result Value Ref Range   Acetaminophen (Tylenol), Serum <10 (L) 10 - 30 ug/mL    Comment:        THERAPEUTIC CONCENTRATIONS VARY SIGNIFICANTLY. A RANGE OF 10-30 ug/mL MAY BE AN EFFECTIVE CONCENTRATION FOR MANY PATIENTS. HOWEVER, SOME ARE BEST TREATED AT CONCENTRATIONS OUTSIDE THIS RANGE. ACETAMINOPHEN CONCENTRATIONS >150 ug/mL AT 4 HOURS AFTER INGESTION AND >50 ug/mL AT 12 HOURS AFTER INGESTION ARE OFTEN ASSOCIATED WITH TOXIC REACTIONS.   Ethanol     Status: None   Collection Time: 12/09/16 11:53 PM  Result Value Ref Range   Alcohol, Ethyl (B) <10 <10 mg/dL    Comment:        LOWEST DETECTABLE LIMIT FOR SERUM ALCOHOL IS 10 mg/dL FOR MEDICAL PURPOSES ONLY   Salicylate level     Status: None   Collection Time: 12/09/16 11:53 PM  Result Value Ref Range   Salicylate Lvl <4.0 2.8 - 30.0 mg/dL  Sample to Blood Bank     Status: None   Collection Time: 12/09/16 11:53 PM  Result Value Ref Range   Blood Bank Specimen SAMPLE AVAILABLE FOR TESTING    Sample Expiration 12/12/2016   Urinalysis, Routine w reflex microscopic     Status: Abnormal   Collection Time: 12/09/16 11:55 PM  Result Value Ref Range   Color, Urine AMBER (A) YELLOW    Comment: BIOCHEMICALS MAY BE AFFECTED BY COLOR   APPearance CLEAR CLEAR   Specific Gravity, Urine 1.011 1.005 - 1.030   pH 5.0 5.0 - 8.0   Glucose, UA NEGATIVE NEGATIVE mg/dL   Hgb urine dipstick MODERATE (A) NEGATIVE   Bilirubin Urine SMALL (A) NEGATIVE   Ketones, ur NEGATIVE NEGATIVE mg/dL   Protein, ur NEGATIVE NEGATIVE mg/dL   Nitrite NEGATIVE NEGATIVE    Leukocytes, UA TRACE (A) NEGATIVE   RBC / HPF 6-30 0 - 5 RBC/hpf   WBC, UA 0-5 0 - 5 WBC/hpf   Bacteria, UA RARE (A) NONE SEEN   Squamous Epithelial / LPF 0-5 (A) NONE SEEN  Mucus PRESENT    Hyaline Casts, UA PRESENT   Troponin I     Status: None   Collection Time: 12/10/16 12:01 AM  Result Value Ref Range   Troponin I <0.03 <0.03 ng/mL  Lipase, blood     Status: Abnormal   Collection Time: 12/10/16  1:25 AM  Result Value Ref Range   Lipase 2,778 (H) 11 - 51 U/L    Comment: RESULTS CONFIRMED BY MANUAL DILUTION  Lipid panel     Status: Abnormal   Collection Time: 12/10/16  5:01 AM  Result Value Ref Range   Cholesterol 279 (H) 0 - 200 mg/dL   Triglycerides 209 (H) <150 mg/dL   HDL 21 (L) >40 mg/dL   Total CHOL/HDL Ratio 13.3 RATIO   VLDL 42 (H) 0 - 40 mg/dL   LDL Cholesterol 216 (H) 0 - 99 mg/dL    Comment:        Total Cholesterol/HDL:CHD Risk Coronary Heart Disease Risk Table                     Men   Women  1/2 Average Risk   3.4   3.3  Average Risk       5.0   4.4  2 X Average Risk   9.6   7.1  3 X Average Risk  23.4   11.0        Use the calculated Patient Ratio above and the CHD Risk Table to determine the patient's CHD Risk.        ATP III CLASSIFICATION (LDL):  <100     mg/dL   Optimal  100-129  mg/dL   Near or Above                    Optimal  130-159  mg/dL   Borderline  160-189  mg/dL   High  >190     mg/dL   Very High Performed at [facility] [address], [place], [place] [address]   TSH     Status: None   Collection Time: 12/10/16  5:01 AM  Result Value Ref Range   TSH 0.578 0.350 - 4.500 uIU/mL    Comment: Performed by a 3rd Generation assay with a functional sensitivity of <=0.01 uIU/mL.  CBC     Status: None   Collection Time: 12/10/16  5:01 AM  Result Value Ref Range   WBC 4.9 4.0 - 10.5 K/uL   RBC 4.01 3.87 - 5.11 MIL/uL   Hemoglobin 12.2 12.0 - 15.0 g/dL   HCT 36.9 36.0 - 46.0 %   MCV 92.0 78.0 - 100.0 fL   MCH 30.4 26.0 - 34.0  pg   MCHC 33.1 30.0 - 36.0 g/dL   RDW 14.3 11.5 - 15.5 %   Platelets 223 150 - 400 K/uL  Lipase, blood     Status: Abnormal   Collection Time: 12/10/16  5:01 AM  Result Value Ref Range   Lipase 1,764 (H) 11 - 51 U/L    Comment: RESULTS CONFIRMED BY MANUAL DILUTION  Lactate dehydrogenase     Status: None   Collection Time: 12/10/16  5:01 AM  Result Value Ref Range   LDH 152 98 - 192 U/L  Comprehensive metabolic panel     Status: Abnormal   Collection Time: 12/10/16  9:00 AM  Result Value Ref Range   Sodium 140 135 - 145 mmol/L   Potassium 3.8 3.5 - 5.1 mmol/L  Chloride 107 101 - 111 mmol/L   CO2 24 22 - 32 mmol/L   Glucose, Bld 94 65 - 99 mg/dL   BUN 9 6 - 20 mg/dL   Creatinine, Ser 0.40 (L) 0.44 - 1.00 mg/dL   Calcium 8.7 (L) 8.9 - 10.3 mg/dL   Total Protein 5.9 (L) 6.5 - 8.1 g/dL   Albumin 2.8 (L) 3.5 - 5.0 g/dL   AST 175 (H) 15 - 41 U/L   ALT 384 (H) 14 - 54 U/L   Alkaline Phosphatase 228 (H) 38 - 126 U/L   Total Bilirubin 9.5 (H) 0.3 - 1.2 mg/dL    Comment: DELTA CHECK NOTED   GFR calc non Af Amer >60 >60 mL/min   GFR calc Af Amer >60 >60 mL/min    Comment: (NOTE) The eGFR has been calculated using the CKD EPI equation. This calculation has not been validated in all clinical situations. eGFR's persistently <60 mL/min signify possible Chronic Kidney Disease.    Anion gap 9 5 - 15  CBC     Status: Abnormal   Collection Time: 12/10/16  9:00 AM  Result Value Ref Range   WBC 3.8 (L) 4.0 - 10.5 K/uL   RBC 3.98 3.87 - 5.11 MIL/uL   Hemoglobin 12.2 12.0 - 15.0 g/dL   HCT 36.5 36.0 - 46.0 %   MCV 91.7 78.0 - 100.0 fL   MCH 30.7 26.0 - 34.0 pg   MCHC 33.4 30.0 - 36.0 g/dL   RDW 14.5 11.5 - 15.5 %   Platelets 221 150 - 400 K/uL  CBG monitoring, ED     Status: None   Collection Time: 12/10/16  9:09 AM  Result Value Ref Range   Glucose-Capillary 87 65 - 99 mg/dL    Dg Chest 2 View  Result Date: 12/10/2016 CLINICAL DATA:  60 year old female with shortness of breath.  EXAM: CHEST  2 VIEW COMPARISON:  None. FINDINGS: The lungs are clear. There is no pleural effusion or pneumothorax. The cardiac silhouette is within normal limits. There is no acute osseous pathology. IMPRESSION: No active cardiopulmonary disease. Electronically Signed   By: Anner Crete M.D.   On: 12/10/2016 01:05   Ct Abdomen Pelvis W Contrast  Result Date: 12/10/2016 CLINICAL DATA:  Unintended weight loss. Abdominal pain. Weakness and nausea. Jaundice. Back pain. Elevated liver function tests. EXAM: CT ABDOMEN AND PELVIS WITH CONTRAST TECHNIQUE: Multidetector CT imaging of the abdomen and pelvis was performed using the standard protocol following bolus administration of intravenous contrast. CONTRAST:  124m ISOVUE-300 IOPAMIDOL (ISOVUE-300) INJECTION 61% COMPARISON:  None. FINDINGS: LOWER CHEST: Dependent atelectasis. Included heart size is normal. No pericardial effusion. HEPATOBILIARY: Mild intrahepatic biliary dilatation. Liver is otherwise unremarkable. Distended gallbladder, subcentimeter layering faint suspected gallstones. Dilated cystic duct. Proximal Common bile duct is 9 mm. Subcentimeter intermediate density filling defect distal Common bile duct (coronal 87/191, sagittal 92/191). PANCREAS: Normal. SPLEEN: Normal. ADRENALS/URINARY TRACT: Kidneys are orthotopic, demonstrating symmetric enhancement. No nephrolithiasis, hydronephrosis or solid renal masses. The unopacified ureters are normal in course and caliber. Delayed imaging through the kidneys demonstrates symmetric prompt contrast excretion within the proximal urinary collecting system. Urinary bladder is partially distended and unremarkable. Normal adrenal glands. STOMACH/BOWEL: The stomach, small and large bowel are normal in course and caliber without inflammatory changes, sensitivity decreased without oral contrast. Mild sigmoid colonic diverticulosis. Normal appendix. VASCULAR/LYMPHATIC: Aortoiliac vessels are normal in course and  caliber. Mild calcific atherosclerosis. No lymphadenopathy by CT size criteria. REPRODUCTIVE: 4.8 x 4.6 cm calcified  RIGHT pelvic mass contiguous with RIGHT adnexae and posterior uterus. Small similarly calcified mass within the uterus. OTHER: No intraperitoneal free fluid or free air. MUSCULOSKELETAL: Nonacute. Mild degenerative change of the spine. Small fat containing umbilical hernia. IMPRESSION: 1. Intra and extra hepatic biliary dilatation to the level of the distal CBD were there is an obstructing gallstone, less likely mass. 2. Cholelithiasis without CT findings of acute cholecystitis. 3. 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 4. Acute findings discussed with and reconfirmed by Dr.DAVID YAO on 12/10/2016 at 2:20 am. Electronically Signed   By: Elon Alas M.D.   On: 12/10/2016 02:22    Review of Systems  Constitutional: Positive for malaise/fatigue and weight loss. Negative for chills and fever.  Respiratory: Positive for shortness of breath. Negative for cough and wheezing.   Cardiovascular: Negative.   Gastrointestinal: Positive for abdominal pain, nausea and vomiting. Negative for blood in stool, constipation, diarrhea and melena.  Genitourinary: Negative.   Neurological: Positive for weakness.   Blood pressure (!) 112/48, pulse (!) 59, temperature 98 F (36.7 C), temperature source Oral, resp. rate 14, height 5' 6.5" (1.689 m), weight 90.3 kg (199 lb), SpO2 99 %. Physical Exam General: Alert, well-developed Caucasian female, appears weak and fatigued, in no acute distress Skin: Jaundiced.  Warm and dry without rash or infection. HEENT: No palpable masses or thyromegaly. Sclera icteric. Pupils equal round and reactive.  Lymph nodes: No cervical, supraclavicular,nodes palpable. Lungs: Breath sounds clear and equal without increased work of breathing Cardiovascular: Regular rate and rhythm without murmur. No JVD or  edema.  Abdomen: Nondistended. Soft with poorly localized mild diffuse tenderness, no guarding or peritoneal signs. No masses palpable. No organomegaly. No palpable hernias. Extremities: No edema or joint swelling or deformity. No chronic venous stasis changes. Neurologic: Alert and fully oriented.  Affect normal.  No gross motor deficits  Assessment/Plan: Cholelithiasis and choledocholithiasis with ongoing common bile duct obstruction and acute pancreatitis.  Agree with plans for urgent ERCP and sphincterotomy.  We will follow closely and plan laparoscopic cholecystectomy as her overall condition improves.  This was discussed with the patient and all her questions answered.  Izzah Pasqua T 12/10/2016, 10:25 AM

## 2016-12-10 NOTE — Anesthesia Procedure Notes (Signed)
Procedure Name: Intubation Date/Time: 12/10/2016 12:44 PM Performed by: West Pugh Pre-anesthesia Checklist: Patient identified, Emergency Drugs available, Suction available, Patient being monitored and Timeout performed Patient Re-evaluated:Patient Re-evaluated prior to induction Oxygen Delivery Method: Circle system utilized Preoxygenation: Pre-oxygenation with 100% oxygen Induction Type: IV induction, Rapid sequence and Cricoid Pressure applied Laryngoscope Size: Mac and 4 Grade View: Grade I Tube type: Oral Tube size: 7.5 mm Number of attempts: 1 Airway Equipment and Method: Stylet Placement Confirmation: ETT inserted through vocal cords under direct vision,  positive ETCO2,  CO2 detector and breath sounds checked- equal and bilateral Secured at: 21 cm Tube secured with: Tape Dental Injury: Teeth and Oropharynx as per pre-operative assessment

## 2016-12-10 NOTE — H&P (Addendum)
TRH H&P   Patient Demographics:    Andrea Blackwell, is a 61 y.o. female  MRN: [id number]   DOB - 1955-08-11  Admit Date - 12/09/2016  Outpatient Primary MD for the patient is Patient, No Pcp Per  Referring MD/NP/PA: Shirlyn Goltz  Outpatient Specialists: none   Patient coming from: home  Chief Complaint  Patient presents with  . Weakness  . Jaundice      HPI:    Andrea Blackwell  is a 61 y.o. female, w hypothyroidism C/o n/v, and vomitting blood last nite. Lost 15 lbs in the past 2 weeks.  + nausea x 2 weeks. RUQ starting 10/22,  denies fever, chills, diarrhea, brbpr, black stool     In ED,  Wbc 8.7, Hgb 14.1, Plt 280 Bun 12, Creatinine 0.36 Ast 228, Alt 505, Alk phos 281, T. Bili 13.6 Lipase 2,778  CT scan  IMPRESSION: 1. Intra and extra hepatic biliary dilatation to the level of the distal CBD were there is an obstructing gallstone, less likely mass. 2. Cholelithiasis without CT findings of acute cholecystitis. 3. 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   Pt will be admitted for gallstone pancreatitis    Review of systems:    In addition to the HPI above, No Fever-chills, No Headache, No changes with Vision or hearing, No problems swallowing food or Liquids, No Chest pain, Cough or Shortness of Breath,No Blood in stool or Urine, No dysuria, No new skin rashes or bruises, No new joints pains-aches,  No new weakness, tingling, numbness in any extremity, No recent weight gain or loss, No polyuria, polydypsia or polyphagia, No significant Mental Stressors.  A full 10 point Review of Systems was done, except as stated above, all other Review of Systems were negative.   With Past History of the following :    Past Medical History:  Diagnosis Date  . Hypothyroidism       Past Surgical History:    Procedure Laterality Date  . OVARY SURGERY        Social History:     Social History  Substance Use Topics  . Smoking status: Never Smoker  . Smokeless tobacco: Never Used  . Alcohol use No     Lives - at home  Mobility - walks by self   Family History :    History reviewed. No pertinent family history. No pancreatitis   Home Medications:   Prior to Admission medications   Medication Sig Start Date End Date Taking? Authorizing Provider  acetaminophen (TYLENOL) 500 MG tablet Take 500 mg by mouth every 8 (eight) hours as needed (back and stomach pain).   Yes [provider]  bismuth subsalicylate (PEPTO BISMOL) 262 MG/15ML suspension Take 30 mLs by mouth every 6 (six) hours as needed for indigestion or diarrhea or loose stools.   Yes [provider]  escitalopram (LEXAPRO) 10 MG tablet Take 10 mg by mouth at bedtime.   Yes [provider]  levothyroxine (SYNTHROID, LEVOTHROID) 75 MCG tablet Take 75 mcg by mouth daily before breakfast.   Yes [provider]     Allergies:     Allergies  Allergen Reactions  . Penicillins Anaphylaxis    Has patient had a PCN reaction causing immediate rash, facial/tongue/throat swelling, SOB or lightheadedness with hypotension: yes Has patient had a PCN reaction causing severe rash involving mucus membranes or skin necrosis: no Has patient had a PCN reaction that required hospitalization: yes Has patient had a PCN reaction occurring within the last 10 years: no If all of the above answers are "NO", then may proceed with Cephalosporin use.      Physical Exam:   Vitals  Blood pressure (!) 145/60, pulse 60, temperature 97.9 F (36.6 C), temperature source Oral, resp. rate 16, height 5' 6.5" (1.689 m), weight 90.3 kg (199 lb), SpO2 94 %.   1. General lying in bed in NAD,   2. Normal affect and insight, Not Suicidal or Homicidal, Awake Alert, Oriented X 3.  3. No F.N deficits, ALL C.Nerves  Intact, Strength 5/5 all 4 extremities, Sensation intact all 4 extremities, Plantars down going.  4. + scleral icterus,  PERRLA. Moist Oral Mucosa.  5. Supple Neck, No JVD, No cervical lymphadenopathy appriciated, No Carotid Bruits.  6. Symmetrical Chest wall movement, Good air movement bilaterally, CTAB.  7. RRR, No Gallops, Rubs or Murmurs, No Parasternal Heave.  8. Positive Bowel Sounds, Abdomen Soft, No tenderness, No organomegaly appriciated,No rebound -guarding or rigidity.  9.  No Cyanosis, Normal Skin Turgor, No Skin Rash or Bruise.  10. Good muscle tone,  joints appear normal , no effusions, Normal ROM.  11. No Palpable Lymph Nodes in Neck or Axillae     Data Review:    CBC  Recent Labs Lab 12/09/16 2353  WBC 8.7  HGB 14.4  HCT 43.1  PLT 280  MCV 91.5  MCH 30.6  MCHC 33.4  RDW 14.2   ------------------------------------------------------------------------------------------------------------------  Chemistries   Recent Labs Lab 12/09/16 2353  NA 136  K 4.1  CL 103  CO2 23  GLUCOSE 147*  BUN 12  CREATININE 0.36*  CALCIUM 9.6  AST 228*  ALT 505*  ALKPHOS 281*  BILITOT 13.6*   ------------------------------------------------------------------------------------------------------------------ estimated creatinine clearance is 86.5 mL/min (A) (by C-G formula based on SCr of 0.36 mg/dL (L)). ------------------------------------------------------------------------------------------------------------------ No results for input(s): TSH, T4TOTAL, T3FREE, THYROIDAB in the last 72 hours.  Invalid input(s): FREET3  Coagulation profile No results for input(s): INR, PROTIME in the last 168 hours. ------------------------------------------------------------------------------------------------------------------- No results for input(s): DDIMER in the last 72  hours. -------------------------------------------------------------------------------------------------------------------  Cardiac Enzymes  Recent Labs Lab 12/10/16 0001  TROPONINI <0.03   ------------------------------------------------------------------------------------------------------------------ No results found for: BNP   ---------------------------------------------------------------------------------------------------------------  Urinalysis    Component Value Date/Time   COLORURINE AMBER (A) 12/09/2016 2355   APPEARANCEUR CLEAR 12/09/2016 2355   LABSPEC 1.011 12/09/2016 2355   PHURINE 5.0 12/09/2016 2355   GLUCOSEU NEGATIVE 12/09/2016 2355   HGBUR MODERATE (A) 12/09/2016 2355   BILIRUBINUR SMALL (A) 12/09/2016 2355   KETONESUR NEGATIVE 12/09/2016 2355   PROTEINUR NEGATIVE 12/09/2016 2355   NITRITE NEGATIVE 12/09/2016 2355   LEUKOCYTESUR TRACE (A) 12/09/2016 2355    ----------------------------------------------------------------------------------------------------------------   Imaging Results:    Dg Chest 2 View  Result Date: 12/10/2016 CLINICAL DATA:  61 year old female with shortness of breath.  EXAM: CHEST  2 VIEW COMPARISON:  None. FINDINGS: The lungs are clear. There is no pleural effusion or pneumothorax. The cardiac silhouette is within normal limits. There is no acute osseous pathology. IMPRESSION: No active cardiopulmonary disease. Electronically Signed   By: Anner Crete M.D.   On: 12/10/2016 01:05   Ct Abdomen Pelvis W Contrast  Result Date: 12/10/2016 CLINICAL DATA:  Unintended weight loss. Abdominal pain. Weakness and nausea. Jaundice. Back pain. Elevated liver function tests. EXAM: CT ABDOMEN AND PELVIS WITH CONTRAST TECHNIQUE: Multidetector CT imaging of the abdomen and pelvis was performed using the standard protocol following bolus administration of intravenous contrast. CONTRAST:  151m ISOVUE-300 IOPAMIDOL (ISOVUE-300) INJECTION 61%  COMPARISON:  None. FINDINGS: LOWER CHEST: Dependent atelectasis. Included heart size is normal. No pericardial effusion. HEPATOBILIARY: Mild intrahepatic biliary dilatation. Liver is otherwise unremarkable. Distended gallbladder, subcentimeter layering faint suspected gallstones. Dilated cystic duct. Proximal Common bile duct is 9 mm. Subcentimeter intermediate density filling defect distal Common bile duct (coronal 87/191, sagittal 92/191). PANCREAS: Normal. SPLEEN: Normal. ADRENALS/URINARY TRACT: Kidneys are orthotopic, demonstrating symmetric enhancement. No nephrolithiasis, hydronephrosis or solid renal masses. The unopacified ureters are normal in course and caliber. Delayed imaging through the kidneys demonstrates symmetric prompt contrast excretion within the proximal urinary collecting system. Urinary bladder is partially distended and unremarkable. Normal adrenal glands. STOMACH/BOWEL: The stomach, small and large bowel are normal in course and caliber without inflammatory changes, sensitivity decreased without oral contrast. Mild sigmoid colonic diverticulosis. Normal appendix. VASCULAR/LYMPHATIC: Aortoiliac vessels are normal in course and caliber. Mild calcific atherosclerosis. No lymphadenopathy by CT size criteria. REPRODUCTIVE: 4.8 x 4.6 cm calcified RIGHT pelvic mass contiguous with RIGHT adnexae and posterior uterus. Small similarly calcified mass within the uterus. OTHER: No intraperitoneal free fluid or free air. MUSCULOSKELETAL: Nonacute. Mild degenerative change of the spine. Small fat containing umbilical hernia. IMPRESSION: 1. Intra and extra hepatic biliary dilatation to the level of the distal CBD were there is an obstructing gallstone, less likely mass. 2. Cholelithiasis without CT findings of acute cholecystitis. 3. 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 4. Acute findings discussed with and reconfirmed by Dr.DAVID YAO on  12/10/2016 at 2:20 am. Electronically Signed   By: CElon AlasM.D.   On: 12/10/2016 02:22      Assessment & Plan:    Active Problems:   Gallstone pancreatitis    Gallstone pancreatitis NPO  Ns iv GI consult by ED, appreciate input Surgery consulted, appreciate input MRCP  Abnormal lft Check acute hepatitis panel Check cmp in am  Hematemesis NPO protonix 849miv x1 then 44m56mr Cbc in am  Anxiety Cont lexapro  Hypothyroidism Cont levothyroxine   DVT Prophylaxis  - SCDs   AM Labs Ordered, also please review Full Orders  Family Communication: Admission, patients condition and plan of care including tests being ordered have been discussed with the patient  who indicate understanding and agree with the plan and Code Status.  Code Status FULL CODE  Likely DC to  home  Condition GUARDED    Consults called: GI by ED  Admission status: inpatient  Time spent in minutes : 45   JamJani GravelD on 12/10/2016 at 4:06 AM  Between 7am to 7pm - Pager - [phone] 7pm go to [email or web address] - password [email or web address] - Office  [phone]

## 2016-12-10 NOTE — ED Notes (Signed)
ED TO INPATIENT HANDOFF REPORT  Name/Age/Gender Andrea Blackwell 61 y.o. female  Code Status Code Status History    This patient does not have a recorded code status. Please follow your organizational policy for patients in this situation.      Home/SNF/Other Home  Chief Complaint jaundice vomiting blood in urine   Level of Care/Admitting Diagnosis ED Disposition    ED Disposition Condition [facility] Area: [place] [[id number]]  Level of Care: Telemetry [5]  Admit to tele based on following criteria: Monitor for Ischemic changes  Diagnosis: Pancreatitis [756433]  Admitting Physician: Jani Gravel [[id number]]  Attending Physician: Jani Gravel [phone]  Estimated length of stay: past midnight tomorrow  Certification:: I certify this patient will need inpatient services for at least 2 midnights  PT Class (Do Not Modify): Inpatient [101]  PT Acc Code (Do Not Modify): Private [1]       Medical History Past Medical History:  Diagnosis Date  . Hypothyroidism     Allergies Allergies  Allergen Reactions  . Penicillins Anaphylaxis    Has patient had a PCN reaction causing immediate rash, facial/tongue/throat swelling, SOB or lightheadedness with hypotension: yes Has patient had a PCN reaction causing severe rash involving mucus membranes or skin necrosis: no Has patient had a PCN reaction that required hospitalization: yes Has patient had a PCN reaction occurring within the last 10 years: no If all of the above answers are "NO", then may proceed with Cephalosporin use.     IV Location/Drains/Wounds Patient Lines/Drains/Airways Status   Active Line/Drains/Airways    Name:   Placement date:   Placement time:   Site:   Days:   Peripheral IV 12/10/16 Right Hand  12/10/16    0016    Hand    less than 1          Labs/Imaging Results for orders placed or performed during the hospital encounter of 12/09/16 (from the past 48 hour(s))  Comprehensive  metabolic panel     Status: Abnormal   Collection Time: 12/09/16 11:53 PM  Result Value Ref Range   Sodium 136 135 - 145 mmol/L   Potassium 4.1 3.5 - 5.1 mmol/L   Chloride 103 101 - 111 mmol/L   CO2 23 22 - 32 mmol/L   Glucose, Bld 147 (H) 65 - 99 mg/dL   BUN 12 6 - 20 mg/dL   Creatinine, Ser 0.36 (L) 0.44 - 1.00 mg/dL   Calcium 9.6 8.9 - 10.3 mg/dL   Total Protein 7.3 6.5 - 8.1 g/dL   Albumin 3.6 3.5 - 5.0 g/dL   AST 228 (H) 15 - 41 U/L   ALT 505 (H) 14 - 54 U/L   Alkaline Phosphatase 281 (H) 38 - 126 U/L   Total Bilirubin 13.6 (H) 0.3 - 1.2 mg/dL   GFR calc non Af Amer >60 >60 mL/min   GFR calc Af Amer >60 >60 mL/min    Comment: (NOTE) The eGFR has been calculated using the CKD EPI equation. This calculation has not been validated in all clinical situations. eGFR's persistently <60 mL/min signify possible Chronic Kidney Disease.    Anion gap 10 5 - 15  CBC     Status: None   Collection Time: 12/09/16 11:53 PM  Result Value Ref Range   WBC 8.7 4.0 - 10.5 K/uL   RBC 4.71 3.87 - 5.11 MIL/uL   Hemoglobin 14.4 12.0 - 15.0 g/dL   HCT 43.1 36.0 - 46.0 %  MCV 91.5 78.0 - 100.0 fL   MCH 30.6 26.0 - 34.0 pg   MCHC 33.4 30.0 - 36.0 g/dL   RDW 14.2 11.5 - 15.5 %   Platelets 280 150 - 400 K/uL  Acetaminophen level     Status: Abnormal   Collection Time: 12/09/16 11:53 PM  Result Value Ref Range   Acetaminophen (Tylenol), Serum <10 (L) 10 - 30 ug/mL    Comment:        THERAPEUTIC CONCENTRATIONS VARY SIGNIFICANTLY. A RANGE OF 10-30 ug/mL MAY BE AN EFFECTIVE CONCENTRATION FOR MANY PATIENTS. HOWEVER, SOME ARE BEST TREATED AT CONCENTRATIONS OUTSIDE THIS RANGE. ACETAMINOPHEN CONCENTRATIONS >150 ug/mL AT 4 HOURS AFTER INGESTION AND >50 ug/mL AT 12 HOURS AFTER INGESTION ARE OFTEN ASSOCIATED WITH TOXIC REACTIONS.   Ethanol     Status: None   Collection Time: 12/09/16 11:53 PM  Result Value Ref Range   Alcohol, Ethyl (B) <10 <10 mg/dL    Comment:        LOWEST DETECTABLE  LIMIT FOR SERUM ALCOHOL IS 10 mg/dL FOR MEDICAL PURPOSES ONLY   Salicylate level     Status: None   Collection Time: 12/09/16 11:53 PM  Result Value Ref Range   Salicylate Lvl <2.9 2.8 - 30.0 mg/dL  Sample to Blood Bank     Status: None   Collection Time: 12/09/16 11:53 PM  Result Value Ref Range   Blood Bank Specimen SAMPLE AVAILABLE FOR TESTING    Sample Expiration 12/12/2016   Urinalysis, Routine w reflex microscopic     Status: Abnormal   Collection Time: 12/09/16 11:55 PM  Result Value Ref Range   Color, Urine AMBER (A) YELLOW    Comment: BIOCHEMICALS MAY BE AFFECTED BY COLOR   APPearance CLEAR CLEAR   Specific Gravity, Urine 1.011 1.005 - 1.030   pH 5.0 5.0 - 8.0   Glucose, UA NEGATIVE NEGATIVE mg/dL   Hgb urine dipstick MODERATE (A) NEGATIVE   Bilirubin Urine SMALL (A) NEGATIVE   Ketones, ur NEGATIVE NEGATIVE mg/dL   Protein, ur NEGATIVE NEGATIVE mg/dL   Nitrite NEGATIVE NEGATIVE   Leukocytes, UA TRACE (A) NEGATIVE   RBC / HPF 6-30 0 - 5 RBC/hpf   WBC, UA 0-5 0 - 5 WBC/hpf   Bacteria, UA RARE (A) NONE SEEN   Squamous Epithelial / LPF 0-5 (A) NONE SEEN   Mucus PRESENT    Hyaline Casts, UA PRESENT   Troponin I     Status: None   Collection Time: 12/10/16 12:01 AM  Result Value Ref Range   Troponin I <0.03 <0.03 ng/mL  Lipase, blood     Status: Abnormal   Collection Time: 12/10/16  1:25 AM  Result Value Ref Range   Lipase 2,778 (H) 11 - 51 U/L    Comment: RESULTS CONFIRMED BY MANUAL DILUTION  TSH     Status: None   Collection Time: 12/10/16  5:01 AM  Result Value Ref Range   TSH 0.578 0.350 - 4.500 uIU/mL    Comment: Performed by a 3rd Generation assay with a functional sensitivity of <=0.01 uIU/mL.  CBC     Status: None   Collection Time: 12/10/16  5:01 AM  Result Value Ref Range   WBC 4.9 4.0 - 10.5 K/uL   RBC 4.01 3.87 - 5.11 MIL/uL   Hemoglobin 12.2 12.0 - 15.0 g/dL   HCT 36.9 36.0 - 46.0 %   MCV 92.0 78.0 - 100.0 fL   MCH 30.4 26.0 - 34.0 pg   MCHC  33.1 30.0 - 36.0 g/dL   RDW 14.3 11.5 - 15.5 %   Platelets 223 150 - 400 K/uL  Lipase, blood     Status: Abnormal   Collection Time: 12/10/16  5:01 AM  Result Value Ref Range   Lipase 1,764 (H) 11 - 51 U/L    Comment: RESULTS CONFIRMED BY MANUAL DILUTION  Lactate dehydrogenase     Status: None   Collection Time: 12/10/16  5:01 AM  Result Value Ref Range   LDH 152 98 - 192 U/L   Dg Chest 2 View  Result Date: 12/10/2016 CLINICAL DATA:  62 year old female with shortness of breath. EXAM: CHEST  2 VIEW COMPARISON:  None. FINDINGS: The lungs are clear. There is no pleural effusion or pneumothorax. The cardiac silhouette is within normal limits. There is no acute osseous pathology. IMPRESSION: No active cardiopulmonary disease. Electronically Signed   By: Anner Crete M.D.   On: 12/10/2016 01:05   Ct Abdomen Pelvis W Contrast  Result Date: 12/10/2016 CLINICAL DATA:  Unintended weight loss. Abdominal pain. Weakness and nausea. Jaundice. Back pain. Elevated liver function tests. EXAM: CT ABDOMEN AND PELVIS WITH CONTRAST TECHNIQUE: Multidetector CT imaging of the abdomen and pelvis was performed using the standard protocol following bolus administration of intravenous contrast. CONTRAST:  142m ISOVUE-300 IOPAMIDOL (ISOVUE-300) INJECTION 61% COMPARISON:  None. FINDINGS: LOWER CHEST: Dependent atelectasis. Included heart size is normal. No pericardial effusion. HEPATOBILIARY: Mild intrahepatic biliary dilatation. Liver is otherwise unremarkable. Distended gallbladder, subcentimeter layering faint suspected gallstones. Dilated cystic duct. Proximal Common bile duct is 9 mm. Subcentimeter intermediate density filling defect distal Common bile duct (coronal 87/191, sagittal 92/191). PANCREAS: Normal. SPLEEN: Normal. ADRENALS/URINARY TRACT: Kidneys are orthotopic, demonstrating symmetric enhancement. No nephrolithiasis, hydronephrosis or solid renal masses. The unopacified ureters are normal in course and  caliber. Delayed imaging through the kidneys demonstrates symmetric prompt contrast excretion within the proximal urinary collecting system. Urinary bladder is partially distended and unremarkable. Normal adrenal glands. STOMACH/BOWEL: The stomach, small and large bowel are normal in course and caliber without inflammatory changes, sensitivity decreased without oral contrast. Mild sigmoid colonic diverticulosis. Normal appendix. VASCULAR/LYMPHATIC: Aortoiliac vessels are normal in course and caliber. Mild calcific atherosclerosis. No lymphadenopathy by CT size criteria. REPRODUCTIVE: 4.8 x 4.6 cm calcified RIGHT pelvic mass contiguous with RIGHT adnexae and posterior uterus. Small similarly calcified mass within the uterus. OTHER: No intraperitoneal free fluid or free air. MUSCULOSKELETAL: Nonacute. Mild degenerative change of the spine. Small fat containing umbilical hernia. IMPRESSION: 1. Intra and extra hepatic biliary dilatation to the level of the distal CBD were there is an obstructing gallstone, less likely mass. 2. Cholelithiasis without CT findings of acute cholecystitis. 3. 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 4. Acute findings discussed with and reconfirmed by Dr.DAVID YAO on 12/10/2016 at 2:20 am. Electronically Signed   By: CElon AlasM.D.   On: 12/10/2016 02:22    Pending Labs [employer]   Start     Ordered   12/10/16 0500  Lipid panel  Tomorrow morning,   R     12/10/16 0417   Signed and Held  HIV antibody (Routine Testing)  Once,   R     Signed and Held   Signed and Held  Comprehensive metabolic panel  Tomorrow morning,   R     Signed and Held   Signed and Held  CBC  Tomorrow morning,   R     Signed  and Held      Vitals/Pain Today's Vitals   12/10/16 0036 12/10/16 0100 12/10/16 0100 12/10/16 0334  BP: (!) 147/66 (!) 157/65  (!) 145/60  Pulse: 63 (!) 55  60  Resp: _0 Temp:    97.9 F (36.6  C)  TempSrc:    Oral  SpO2: 97%   94%  Weight:      Height:      PainSc:   2      Isolation Precautions No active isolations  Medications Medications  iopamidol (ISOVUE-300) 61 % injection (not administered)  ondansetron (ZOFRAN-ODT) disintegrating tablet 4 mg (4 mg Oral Given 12/09/16 2347)  sodium chloride 0.9 % bolus 1,000 mL (0 mLs Intravenous Stopped 12/10/16 0130)  morphine 4 MG/ML injection 4 mg (4 mg Intravenous Given 12/10/16 0019)  iopamidol (ISOVUE-300) 61 % injection 100 mL (100 mLs Intravenous Contrast Given 12/10/16 0137)  sodium chloride 0.9 % bolus 1,000 mL (0 mLs Intravenous Stopped 12/10/16 0455)    Mobility walks

## 2016-12-10 NOTE — Progress Notes (Signed)
Back from Endo A/OX4 voices no c/o

## 2016-12-10 NOTE — Interval H&P Note (Signed)
History and Physical Interval Note:  12/10/2016 12:27 PM  Andrea Blackwell  has presented today for surgery, with the diagnosis of choledocholithiasis  The various methods of treatment have been discussed with the patient and family. After consideration of risks, benefits and other options for treatment, the patient has consented to  Procedure(s): ENDOSCOPIC RETROGRADE CHOLANGIOPANCREATOGRAPHY (ERCP) (N/A) as a surgical intervention .  The patient's history has been reviewed, patient examined, no change in status, stable for surgery.  I have reviewed the patient's chart and labs.  Questions were answered to the patient's satisfaction.     Steffi Noviello D

## 2016-12-10 NOTE — ED Notes (Signed)
CBG 87. 

## 2016-12-10 NOTE — Transfer of Care (Signed)
Immediate Anesthesia Transfer of Care Note  Patient: Andrea Blackwell  Procedure(s) Performed: ENDOSCOPIC RETROGRADE CHOLANGIOPANCREATOGRAPHY (ERCP) (N/A )  Patient Location: PACU  Anesthesia Type:General  Level of Consciousness: awake, oriented and patient cooperative  Airway & Oxygen Therapy: Patient Spontanous Breathing and Patient connected to face mask  Post-op Assessment: Report given to RN, Post -op Vital signs reviewed and stable and Patient moving all extremities X 4  Post vital signs: Reviewed and stable  Last Vitals:  Vitals:   12/10/16 1224 12/10/16 1340  BP: (!) 153/54 (!) 145/72  Pulse: (!) 57 (!) 56  Resp: 16 14  Temp: 36.7 C   SpO2: 99% 100%    Last Pain:  Vitals:   12/10/16 1224  TempSrc: Oral  PainSc:          Complications: No apparent anesthesia complications

## 2016-12-10 NOTE — Consult Note (Signed)
Consult Note for [facility]  Reason for Consult: Choledocholithiasis Referring Physician: [facility]  Jenelle Cuadrado HPI: This is a 61 year old female with a PMH of hypothyroidism admitted for complaints of hematemesis, nausea, vomiting, and jaundice.  Her symptoms started two weeks ago.  At that time she admits to an eating binge as she is an emotional eater.  As a result she states that her abdomen "exploded" and she felt ill with pain.  She slept from Friday evening up until Sunday morning, per her report.  Over the interval time period she continued to have abdominal pain.  She noted that her urine was orange in color and it progressively worsened.  The event that brought her in to the hospital was the one bout of hematemesis when she had nausea and vomiting.  In the ER she was noted to have a gallstone pancreatitis and there is a distal obstructing stone in the CBD.  As a result of her symptoms she reports a 15 lbs weight loss.  Past Medical History:  Diagnosis Date  . Hypothyroidism     Past Surgical History:  Procedure Laterality Date  . OVARY SURGERY      History reviewed. No pertinent family history.  Social History:  reports that she has never smoked. She has never used smokeless tobacco. She reports that she does not drink alcohol or use drugs.  Allergies:  Allergies  Allergen Reactions  . Penicillins Anaphylaxis    Has patient had a PCN reaction causing immediate rash, facial/tongue/throat swelling, SOB or lightheadedness with hypotension: yes Has patient had a PCN reaction causing severe rash involving mucus membranes or skin necrosis: no Has patient had a PCN reaction that required hospitalization: yes Has patient had a PCN reaction occurring within the last 10 years: no If all of the above answers are "NO", then may proceed with Cephalosporin use.     Medications:  Scheduled: . iopamidol       Continuous: . pantoprazole (PROTONIX) IVPB 80 mg (12/10/16 0939)  .  pantoprozole (PROTONIX) infusion 8 mg/hr (12/10/16 0939)    Results for orders placed or performed during the hospital encounter of 12/09/16 (from the past 24 hour(s))  Comprehensive metabolic panel     Status: Abnormal   Collection Time: 12/09/16 11:53 PM  Result Value Ref Range   Sodium 136 135 - 145 mmol/L   Potassium 4.1 3.5 - 5.1 mmol/L   Chloride 103 101 - 111 mmol/L   CO2 23 22 - 32 mmol/L   Glucose, Bld 147 (H) 65 - 99 mg/dL   BUN 12 6 - 20 mg/dL   Creatinine, Ser 0.36 (L) 0.44 - 1.00 mg/dL   Calcium 9.6 8.9 - 10.3 mg/dL   Total Protein 7.3 6.5 - 8.1 g/dL   Albumin 3.6 3.5 - 5.0 g/dL   AST 228 (H) 15 - 41 U/L   ALT 505 (H) 14 - 54 U/L   Alkaline Phosphatase 281 (H) 38 - 126 U/L   Total Bilirubin 13.6 (H) 0.3 - 1.2 mg/dL   GFR calc non Af Amer >60 >60 mL/min   GFR calc Af Amer >60 >60 mL/min   Anion gap 10 5 - 15  CBC     Status: None   Collection Time: 12/09/16 11:53 PM  Result Value Ref Range   WBC 8.7 4.0 - 10.5 K/uL   RBC 4.71 3.87 - 5.11 MIL/uL   Hemoglobin 14.4 12.0 - 15.0 g/dL   HCT 43.1 36.0 - 46.0 %  MCV 91.5 78.0 - 100.0 fL   MCH 30.6 26.0 - 34.0 pg   MCHC 33.4 30.0 - 36.0 g/dL   RDW 14.2 11.5 - 15.5 %   Platelets 280 150 - 400 K/uL  Acetaminophen level     Status: Abnormal   Collection Time: 12/09/16 11:53 PM  Result Value Ref Range   Acetaminophen (Tylenol), Serum <10 (L) 10 - 30 ug/mL  Ethanol     Status: None   Collection Time: 12/09/16 11:53 PM  Result Value Ref Range   Alcohol, Ethyl (B) <57 <32 mg/dL  Salicylate level     Status: None   Collection Time: 12/09/16 11:53 PM  Result Value Ref Range   Salicylate Lvl <2.0 2.8 - 30.0 mg/dL  Sample to Blood Bank     Status: None   Collection Time: 12/09/16 11:53 PM  Result Value Ref Range   Blood Bank Specimen SAMPLE AVAILABLE FOR TESTING    Sample Expiration 12/12/2016   Urinalysis, Routine w reflex microscopic     Status: Abnormal   Collection Time: 12/09/16 11:55 PM  Result Value Ref Range    Color, Urine AMBER (A) YELLOW   APPearance CLEAR CLEAR   Specific Gravity, Urine 1.011 1.005 - 1.030   pH 5.0 5.0 - 8.0   Glucose, UA NEGATIVE NEGATIVE mg/dL   Hgb urine dipstick MODERATE (A) NEGATIVE   Bilirubin Urine SMALL (A) NEGATIVE   Ketones, ur NEGATIVE NEGATIVE mg/dL   Protein, ur NEGATIVE NEGATIVE mg/dL   Nitrite NEGATIVE NEGATIVE   Leukocytes, UA TRACE (A) NEGATIVE   RBC / HPF 6-30 0 - 5 RBC/hpf   WBC, UA 0-5 0 - 5 WBC/hpf   Bacteria, UA RARE (A) NONE SEEN   Squamous Epithelial / LPF 0-5 (A) NONE SEEN   Mucus PRESENT    Hyaline Casts, UA PRESENT   Troponin I     Status: None   Collection Time: 12/10/16 12:01 AM  Result Value Ref Range   Troponin I <0.03 <0.03 ng/mL  Lipase, blood     Status: Abnormal   Collection Time: 12/10/16  1:25 AM  Result Value Ref Range   Lipase 2,778 (H) 11 - 51 U/L  TSH     Status: None   Collection Time: 12/10/16  5:01 AM  Result Value Ref Range   TSH 0.578 0.350 - 4.500 uIU/mL  CBC     Status: None   Collection Time: 12/10/16  5:01 AM  Result Value Ref Range   WBC 4.9 4.0 - 10.5 K/uL   RBC 4.01 3.87 - 5.11 MIL/uL   Hemoglobin 12.2 12.0 - 15.0 g/dL   HCT 36.9 36.0 - 46.0 %   MCV 92.0 78.0 - 100.0 fL   MCH 30.4 26.0 - 34.0 pg   MCHC 33.1 30.0 - 36.0 g/dL   RDW 14.3 11.5 - 15.5 %   Platelets 223 150 - 400 K/uL  Lipase, blood     Status: Abnormal   Collection Time: 12/10/16  5:01 AM  Result Value Ref Range   Lipase 1,764 (H) 11 - 51 U/L  Lactate dehydrogenase     Status: None   Collection Time: 12/10/16  5:01 AM  Result Value Ref Range   LDH 152 98 - 192 U/L  CBG monitoring, ED     Status: None   Collection Time: 12/10/16  9:09 AM  Result Value Ref Range   Glucose-Capillary 87 65 - 99 mg/dL     Dg Chest 2 View  Result  Date: 12/10/2016 CLINICAL DATA:  61 year old female with shortness of breath. EXAM: CHEST  2 VIEW COMPARISON:  None. FINDINGS: The lungs are clear. There is no pleural effusion or pneumothorax. The cardiac  silhouette is within normal limits. There is no acute osseous pathology. IMPRESSION: No active cardiopulmonary disease. Electronically Signed   By: Anner Crete M.D.   On: 12/10/2016 01:05   Ct Abdomen Pelvis W Contrast  Result Date: 12/10/2016 CLINICAL DATA:  Unintended weight loss. Abdominal pain. Weakness and nausea. Jaundice. Back pain. Elevated liver function tests. EXAM: CT ABDOMEN AND PELVIS WITH CONTRAST TECHNIQUE: Multidetector CT imaging of the abdomen and pelvis was performed using the standard protocol following bolus administration of intravenous contrast. CONTRAST:  172mL ISOVUE-300 IOPAMIDOL (ISOVUE-300) INJECTION 61% COMPARISON:  None. FINDINGS: LOWER CHEST: Dependent atelectasis. Included heart size is normal. No pericardial effusion. HEPATOBILIARY: Mild intrahepatic biliary dilatation. Liver is otherwise unremarkable. Distended gallbladder, subcentimeter layering faint suspected gallstones. Dilated cystic duct. Proximal Common bile duct is 9 mm. Subcentimeter intermediate density filling defect distal Common bile duct (coronal 87/191, sagittal 92/191). PANCREAS: Normal. SPLEEN: Normal. ADRENALS/URINARY TRACT: Kidneys are orthotopic, demonstrating symmetric enhancement. No nephrolithiasis, hydronephrosis or solid renal masses. The unopacified ureters are normal in course and caliber. Delayed imaging through the kidneys demonstrates symmetric prompt contrast excretion within the proximal urinary collecting system. Urinary bladder is partially distended and unremarkable. Normal adrenal glands. STOMACH/BOWEL: The stomach, small and large bowel are normal in course and caliber without inflammatory changes, sensitivity decreased without oral contrast. Mild sigmoid colonic diverticulosis. Normal appendix. VASCULAR/LYMPHATIC: Aortoiliac vessels are normal in course and caliber. Mild calcific atherosclerosis. No lymphadenopathy by CT size criteria. REPRODUCTIVE: 4.8 x 4.6 cm calcified RIGHT pelvic  mass contiguous with RIGHT adnexae and posterior uterus. Small similarly calcified mass within the uterus. OTHER: No intraperitoneal free fluid or free air. MUSCULOSKELETAL: Nonacute. Mild degenerative change of the spine. Small fat containing umbilical hernia. IMPRESSION: 1. Intra and extra hepatic biliary dilatation to the level of the distal CBD were there is an obstructing gallstone, less likely mass. 2. Cholelithiasis without CT findings of acute cholecystitis. 3. 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 4. Acute findings discussed with and reconfirmed by Dr.DAVID YAO on 12/10/2016 at 2:20 am. Electronically Signed   By: Elon Alas M.D.   On: 12/10/2016 02:22    ROS:  As stated above in the HPI otherwise negative.  Blood pressure (!) 145/60, pulse 60, temperature 97.9 F (36.6 C), temperature source Oral, resp. rate 16, height 5' 6.5" (1.689 m), weight 90.3 kg (199 lb), SpO2 94 %.    PE: Gen: NAD, Alert and Oriented HEENT:  [place]/AT, EOMI Neck: Supple, no LAD Lungs: CTA Bilaterally CV: RRR without M/G/R ABM: Soft, mild tenderness, +BS Ext: No C/C/E  Assessment/Plan: 1) Choledocholithiasis. 2) Obstructive liver enzymes pattern. 3) Cholelithiasis. 4) Gallstone pancreatitis. 5) Weight loss. 6) Hematemesis - HGB is stable.   The patient appears to have a distal CBD stone, but her bilirubin is much higher than anticipated for a benign obstructive etiology.  Even though her pancreas appears normal her lipase is markedly elevated.  There is most likely a component of under hydration.  With the CBD stone, further treatment is required with an ERCP.  I discussed with her the risks of bleeding, perforation, and pancreatitis.  She is allergic to PCN.  Cipro will be administered instead.  Adelbert Gaspard D 12/10/2016, 9:36 AM

## 2016-12-10 NOTE — Progress Notes (Signed)
[facility] PROGRESS NOTE  Andrea Blackwell RSW:[id number] DOB: 10/22/1955 DOA: 12/09/2016  PCP: Patient, No Pcp Per  Brief History/Interval Summary: 61 year old Caucasian female with a past medical history of hypothyroidism presented with complains of feeling sick and nauseated for the past couple of weeks with extremely poor appetite.  Has lost 15 pounds of the last 2 weeks.  Then she vomited blood the day prior to admission.  So she came into the emergency department.  Patient was found to have acute cholecystitis and possible choledocholithiasis.  She was hospitalized for further management.  Reason for Visit: Acute cholecystitis  Consultants: Gastroenterology.  General surgery.  Procedures: None yet  Antibiotics: Will initiate ciprofloxacin  Subjective/Interval History: Patient still feels somewhat nauseated but has not had any further episodes of vomiting.  Continues to have discomfort in the upper abdomen.  No chest pain or shortness of breath.  ROS: Denies any headaches  Objective:  Vital Signs  Vitals:   12/10/16 0334 12/10/16 0940 12/10/16 1000 12/10/16 1043  BP: (!) 145/60 (!) 112/48  (!) 115/58  Pulse: 60 (!) 53 (!) 59 (!) 40  Resp: 16 13 14 13   Temp: 97.9 F (36.6 C)  98 F (36.7 C) 97.9 F (36.6 C)  TempSrc: Oral  Oral Oral  SpO2: 94%  99% 96%  Weight:      Height:        Intake/Output Summary (Last 24 hours) at 12/10/16 1204 Last data filed at 12/10/16 0455  Gross per 24 hour  Intake             2000 ml  Output                0 ml  Net             2000 ml   Filed Weights   12/09/16 2341  Weight: 90.3 kg (199 lb)    General appearance: alert, cooperative, appears stated age and no distress Head: Normocephalic, without obvious abnormality, atraumatic Resp: clear to auscultation bilaterally Cardio: regular rate and rhythm, S1, S2 normal, no murmur, click, rub or gallop GI: Abdomen is obese soft.  Tender in the epigastric and right upper quadrant  without any rebound rigidity or guarding.  No masses organomegaly.  Bowel sounds are absent. Extremities: extremities normal, atraumatic, no cyanosis or edema Neurologic: Awake alert.  No obvious focal neurological deficits.  Lab Results:  Data Reviewed: I have personally reviewed following labs and imaging studies  CBC:  Recent Labs Lab 12/09/16 2353 12/10/16 0501 12/10/16 0900  WBC 8.7 4.9 3.8*  HGB 14.4 12.2 12.2  HCT 43.1 36.9 36.5  MCV 91.5 92.0 91.7  PLT 280 223 093    Basic Metabolic Panel:  Recent Labs Lab 12/09/16 2353 12/10/16 0900  NA 136 140  K 4.1 3.8  CL 103 107  CO2 23 24  GLUCOSE 147* 94  BUN 12 9  CREATININE 0.36* 0.40*  CALCIUM 9.6 8.7*    GFR: Estimated Creatinine Clearance: 86.5 mL/min (A) (by C-G formula based on SCr of 0.4 mg/dL (L)).  Liver Function Tests:  Recent Labs Lab 12/09/16 2353 12/10/16 0900  AST 228* 175*  ALT 505* 384*  ALKPHOS 281* 228*  BILITOT 13.6* 9.5*  PROT 7.3 5.9*  ALBUMIN 3.6 2.8*     Recent Labs Lab 12/10/16 0125 12/10/16 0501  LIPASE 2,778* 1,764*   Cardiac Enzymes:  Recent Labs Lab 12/10/16 0001  TROPONINI <0.03    CBG:  Recent Labs Lab 12/10/16  0909  GLUCAP 87    Lipid Profile:  Recent Labs  12/10/16 0501  CHOL 279*  HDL 21*  LDLCALC 216*  TRIG 209*  CHOLHDL 13.3    Thyroid Function Tests:  Recent Labs  12/10/16 0501  TSH 0.578      Radiology Studies: Dg Chest 2 View  Result Date: 12/10/2016 CLINICAL DATA:  61 year old female with shortness of breath. EXAM: CHEST  2 VIEW COMPARISON:  None. FINDINGS: The lungs are clear. There is no pleural effusion or pneumothorax. The cardiac silhouette is within normal limits. There is no acute osseous pathology. IMPRESSION: No active cardiopulmonary disease. Electronically Signed   By: Anner Crete M.D.   On: 12/10/2016 01:05   Ct Abdomen Pelvis W Contrast  Result Date: 12/10/2016 CLINICAL DATA:  Unintended weight loss.  Abdominal pain. Weakness and nausea. Jaundice. Back pain. Elevated liver function tests. EXAM: CT ABDOMEN AND PELVIS WITH CONTRAST TECHNIQUE: Multidetector CT imaging of the abdomen and pelvis was performed using the standard protocol following bolus administration of intravenous contrast. CONTRAST:  166mL ISOVUE-300 IOPAMIDOL (ISOVUE-300) INJECTION 61% COMPARISON:  None. FINDINGS: LOWER CHEST: Dependent atelectasis. Included heart size is normal. No pericardial effusion. HEPATOBILIARY: Mild intrahepatic biliary dilatation. Liver is otherwise unremarkable. Distended gallbladder, subcentimeter layering faint suspected gallstones. Dilated cystic duct. Proximal Common bile duct is 9 mm. Subcentimeter intermediate density filling defect distal Common bile duct (coronal 87/191, sagittal 92/191). PANCREAS: Normal. SPLEEN: Normal. ADRENALS/URINARY TRACT: Kidneys are orthotopic, demonstrating symmetric enhancement. No nephrolithiasis, hydronephrosis or solid renal masses. The unopacified ureters are normal in course and caliber. Delayed imaging through the kidneys demonstrates symmetric prompt contrast excretion within the proximal urinary collecting system. Urinary bladder is partially distended and unremarkable. Normal adrenal glands. STOMACH/BOWEL: The stomach, small and large bowel are normal in course and caliber without inflammatory changes, sensitivity decreased without oral contrast. Mild sigmoid colonic diverticulosis. Normal appendix. VASCULAR/LYMPHATIC: Aortoiliac vessels are normal in course and caliber. Mild calcific atherosclerosis. No lymphadenopathy by CT size criteria. REPRODUCTIVE: 4.8 x 4.6 cm calcified RIGHT pelvic mass contiguous with RIGHT adnexae and posterior uterus. Small similarly calcified mass within the uterus. OTHER: No intraperitoneal free fluid or free air. MUSCULOSKELETAL: Nonacute. Mild degenerative change of the spine. Small fat containing umbilical hernia. IMPRESSION: 1. Intra and extra  hepatic biliary dilatation to the level of the distal CBD were there is an obstructing gallstone, less likely mass. 2. Cholelithiasis without CT findings of acute cholecystitis. 3. 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 4. Acute findings discussed with and reconfirmed by Dr.DAVID YAO on 12/10/2016 at 2:20 am. Electronically Signed   By: Elon Alas M.D.   On: 12/10/2016 02:22     Medications:  Scheduled: . escitalopram  10 mg Oral QHS  . [START ON 12/11/2016] Influenza vac split quadrivalent PF  0.5 mL Intramuscular Tomorrow-1000  . iopamidol      . levothyroxine  75 mcg Oral QAC breakfast   Continuous: . sodium chloride    . sodium chloride    . pantoprozole (PROTONIX) infusion 8 mg/hr (12/10/16 0939)   YJE:HUDJSHFW injection, ondansetron (ZOFRAN) IV  Assessment/Plan:  Active Problems:   Gallstone pancreatitis   Pancreatitis    Acute gallstone pancreatitis Significant elevation in lipase level noted.  n.p.o.  IV fluids.  Triglyceride level 209.  Acute cholecystitis with concern for choledocholithiasis General surgery and gastroenterology consulted.  Patient is noted to have significant elevation in bilirubin and alkaline phosphatase.  Patient to undergo  ERCP today.  Will cancel MRCP.  Discussed with Dr. Benson Norway.  Bilirubin noted to be better this morning compared to last night.  Due to concern for cholecystitis will initiate ciprofloxacin.  Noted to be allergic to penicillin.  Will eventually need a cholecystectomy.  Hematemesis Patient apparently vomited blood last night.  No further episodes noted in the hospital.  Hemoglobin is stable.  She is not on anticoagulation at home.  She denies using NSAIDs.  Check PT/INR.  She will benefit from visualization of her upper GI tract.  Continue PPI.  Hyperlipidemia Lipid panel noted.  Will need outpatient monitoring and management.  Calcified mass in the  pelvis Noted incidentally on CT scan.  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  Outpatient management.  History of anxiety disorder Continue Lexapro.  History of hypothyroidism Continue levothyroxine.  TSH is normal.  DVT Prophylaxis: SCDs for now    Code Status: Full code Family Communication: Discussed with the patient Disposition Plan: Management as outlined above.  ERCP today.    LOS: 0 days   [facility] Pager [phone] 12/10/2016, 12:04 PM  If 7PM-7AM, please contact night-coverage at [email or web address], password [facility]

## 2016-12-10 NOTE — Anesthesia Preprocedure Evaluation (Addendum)

## 2016-12-11 DIAGNOSIS — K8043 Calculus of bile duct with acute cholecystitis with obstruction: Secondary | ICD-10-CM

## 2016-12-11 LAB — COMPREHENSIVE METABOLIC PANEL
ALBUMIN: 2.7 g/dL — AB (ref 3.5–5.0)
ALK PHOS: 213 U/L — AB (ref 38–126)
ALT: 334 U/L — AB (ref 14–54)
ANION GAP: 9 (ref 5–15)
AST: 162 U/L — ABNORMAL HIGH (ref 15–41)
BILIRUBIN TOTAL: 9.1 mg/dL — AB (ref 0.3–1.2)
BUN: 10 mg/dL (ref 6–20)
CALCIUM: 8.6 mg/dL — AB (ref 8.9–10.3)
CO2: 22 mmol/L (ref 22–32)
CREATININE: 0.53 mg/dL (ref 0.44–1.00)
Chloride: 106 mmol/L (ref 101–111)
GFR calc Af Amer: >60 mL/min (ref >60)
GFR calc non Af Amer: >60 mL/min (ref >60)
GLUCOSE: 129 mg/dL — AB (ref 65–99)
Potassium: 4.3 mmol/L (ref 3.5–5.1)
SODIUM: 137 mmol/L (ref 135–145)
TOTAL PROTEIN: 5.6 g/dL — AB (ref 6.5–8.1)

## 2016-12-11 LAB — CBC
HEMATOCRIT: 35.5 % — AB (ref 36.0–46.0)
HEMOGLOBIN: 12 g/dL (ref 12.0–15.0)
MCH: 31.1 pg (ref 26.0–34.0)
MCHC: 33.8 g/dL (ref 30.0–36.0)
MCV: 92 fL (ref 78.0–100.0)
Platelets: 210 10*3/uL (ref 150–400)
RBC: 3.86 MIL/uL — AB (ref 3.87–5.11)
RDW: 14.5 % (ref 11.5–15.5)
WBC: 4.3 10*3/uL (ref 4.0–10.5)

## 2016-12-11 LAB — LIPASE, BLOOD: Lipase: 68 U/L — ABNORMAL HIGH (ref 11–51)

## 2016-12-11 LAB — HIV ANTIBODY (ROUTINE TESTING W REFLEX): HIV Screen 4th Generation wRfx: NONREACTIVE

## 2016-12-11 MED ORDER — PHENOL 1.4 % MT LIQD
1.0000 | OROMUCOSAL | Status: DC | PRN
  Administered 2016-12-11: 1 via OROMUCOSAL
  Filled 2016-12-11: qty 177

## 2016-12-11 MED ORDER — PANTOPRAZOLE SODIUM 40 MG IV SOLR
40.0000 mg | Freq: Two times a day (BID) | INTRAVENOUS | Status: DC
  Administered 2016-12-11 – 2016-12-13 (×4): 40 mg via INTRAVENOUS
  Filled 2016-12-11 (×5): qty 40

## 2016-12-11 NOTE — Progress Notes (Signed)
Subjective: No complaints.  Feeling well.  No further abdominal pain.  Objective: Vital signs in last 24 hours: Temp:  [97.6 F (36.4 C)-98.4 F (36.9 C)] 97.6 F (36.4 C) (11/04 0338) Pulse Rate:  [40-59] 58 (11/04 0338) Resp:  [11-18] 16 (11/04 0338) BP: (112-153)/(48-72) 127/57 (11/04 0338) SpO2:  [95 %-100 %] 98 % (11/04 0338) Weight:  [94.5 kg (208 lb 5.4 oz)] 94.5 kg (208 lb 5.4 oz) (11/04 0338) Last BM Date: 12/10/16  Intake/Output from previous day: 11/03 0701 - 11/04 0700 In: 3583.8 [P.O.:480; I.V.:2703.8; IV Piggyback:400] Out: 2150 [Urine:2150] Intake/Output this shift: No intake/output data recorded.  General appearance: alert and no distress GI: soft, non-tender; bowel sounds normal; no masses,  no organomegaly  Lab Results: Recent Labs    12/09/16 2353 12/10/16 0501 12/10/16 0900  WBC 8.7 4.9 3.8*  HGB 14.4 12.2 12.2  HCT 43.1 36.9 36.5  PLT 280 223 221   BMET Recent Labs    12/09/16 2353 12/10/16 0900  NA 136 140  K 4.1 3.8  CL 103 107  CO2 23 24  GLUCOSE 147* 94  BUN 12 9  CREATININE 0.36* 0.40*  CALCIUM 9.6 8.7*   LFT Recent Labs    12/10/16 0900  PROT 5.9*  ALBUMIN 2.8*  AST 175*  ALT 384*  ALKPHOS 228*  BILITOT 9.5*   PT/INR Recent Labs    12/10/16 1531  LABPROT 12.4  INR 0.93   Hepatitis Panel No results for input(s): HEPBSAG, HCVAB, HEPAIGM, HEPBIGM in the last 72 hours. C-Diff No results for input(s): CDIFFTOX in the last 72 hours. Fecal Lactopherrin No results for input(s): FECLLACTOFRN in the last 72 hours.  Studies/Results: Dg Chest 2 View  Result Date: 12/10/2016 CLINICAL DATA:  61 year old female with shortness of breath. EXAM: CHEST  2 VIEW COMPARISON:  None. FINDINGS: The lungs are clear. There is no pleural effusion or pneumothorax. The cardiac silhouette is within normal limits. There is no acute osseous pathology. IMPRESSION: No active cardiopulmonary disease. Electronically Signed   By: Anner Crete  M.D.   On: 12/10/2016 01:05   Ct Abdomen Pelvis W Contrast  Result Date: 12/10/2016 CLINICAL DATA:  Unintended weight loss. Abdominal pain. Weakness and nausea. Jaundice. Back pain. Elevated liver function tests. EXAM: CT ABDOMEN AND PELVIS WITH CONTRAST TECHNIQUE: Multidetector CT imaging of the abdomen and pelvis was performed using the standard protocol following bolus administration of intravenous contrast. CONTRAST:  112mL ISOVUE-300 IOPAMIDOL (ISOVUE-300) INJECTION 61% COMPARISON:  None. FINDINGS: LOWER CHEST: Dependent atelectasis. Included heart size is normal. No pericardial effusion. HEPATOBILIARY: Mild intrahepatic biliary dilatation. Liver is otherwise unremarkable. Distended gallbladder, subcentimeter layering faint suspected gallstones. Dilated cystic duct. Proximal Common bile duct is 9 mm. Subcentimeter intermediate density filling defect distal Common bile duct (coronal 87/191, sagittal 92/191). PANCREAS: Normal. SPLEEN: Normal. ADRENALS/URINARY TRACT: Kidneys are orthotopic, demonstrating symmetric enhancement. No nephrolithiasis, hydronephrosis or solid renal masses. The unopacified ureters are normal in course and caliber. Delayed imaging through the kidneys demonstrates symmetric prompt contrast excretion within the proximal urinary collecting system. Urinary bladder is partially distended and unremarkable. Normal adrenal glands. STOMACH/BOWEL: The stomach, small and large bowel are normal in course and caliber without inflammatory changes, sensitivity decreased without oral contrast. Mild sigmoid colonic diverticulosis. Normal appendix. VASCULAR/LYMPHATIC: Aortoiliac vessels are normal in course and caliber. Mild calcific atherosclerosis. No lymphadenopathy by CT size criteria. REPRODUCTIVE: 4.8 x 4.6 cm calcified RIGHT pelvic mass contiguous with RIGHT adnexae and posterior uterus. Small similarly calcified mass within the  uterus. OTHER: No intraperitoneal free fluid or free air.  MUSCULOSKELETAL: Nonacute. Mild degenerative change of the spine. Small fat containing umbilical hernia. IMPRESSION: 1. Intra and extra hepatic biliary dilatation to the level of the distal CBD were there is an obstructing gallstone, less likely mass. 2. Cholelithiasis without CT findings of acute cholecystitis. 3. 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 4. Acute findings discussed with and reconfirmed by Dr.DAVID YAO on 12/10/2016 at 2:20 am. Electronically Signed   By: Elon Alas M.D.   On: 12/10/2016 02:22   Dg Ercp Biliary & Pancreatic Ducts  Result Date: 12/10/2016 CLINICAL DATA:  Bile duct stone EXAM: ERCP TECHNIQUE: Multiple spot images obtained with the fluoroscopic device and submitted for interpretation post-procedure. FLUOROSCOPY TIME:  Fluoroscopy Time:  1 minutes and 42 seconds Radiation Exposure Index (if provided by the fluoroscopic device): Number of Acquired Spot Images: 4 COMPARISON:  None. FINDINGS: Contrast fills the biliary tree. Balloon stone retrieval is documented. IMPRESSION: See above. These images were submitted for radiologic interpretation only. Please see the procedural report for the amount of contrast and the fluoroscopy time utilized. Electronically Signed   By: Marybelle Killings M.D.   On: 12/10/2016 13:30    Medications:  Scheduled: . escitalopram  10 mg Oral QHS  . Influenza vac split quadrivalent PF  0.5 mL Intramuscular Tomorrow-1000  . levothyroxine  75 mcg Oral QAC breakfast   Continuous: . sodium chloride 150 mL/hr at 12/11/16 0149  . ciprofloxacin Stopped (12/10/16 2351)  . pantoprozole (PROTONIX) infusion 8 mg/hr (12/11/16 0341)    Assessment/Plan: 1) Gallstone pancreatitis. 2) Choledocholithiasis.   The patient is well and her pain has resolved after stone extraction.  There are no complications with the ERCP.    Plan: 1) Lap chole per Surgery. 2) Signing off.  LOS: 1 day    Dencil Cayson D 12/11/2016, 7:48 AM

## 2016-12-11 NOTE — Progress Notes (Signed)
Patient ID: Andrea Blackwell, female   DOB: [birth year], 61 y.o.   MRN: [id number] 1 Day Post-Op   Subjective: Her pain is relieved following ERCP and stone extraction.  No complaints this morning.  Objective: Vital signs in last 24 hours: Temp:  [97.6 F (36.4 C)-98.4 F (36.9 C)] 97.6 F (36.4 C) (11/04 0338) Pulse Rate:  [40-59] 58 (11/04 0338) Resp:  [11-18] 16 (11/04 0338) BP: (112-153)/(48-72) 127/57 (11/04 0338) SpO2:  [95 %-100 %] 98 % (11/04 0338) Weight:  [94.5 kg (208 lb 5.4 oz)] 94.5 kg (208 lb 5.4 oz) (11/04 0338) Last BM Date: 12/10/16  Intake/Output from previous day: 11/03 0701 - 11/04 0700 In: 3583.8 [P.O.:480; I.V.:2703.8; IV Piggyback:400] Out: 2150 [Urine:2150] Intake/Output this shift: No intake/output data recorded.  General appearance: alert, cooperative and no distress GI: normal findings: soft, non-tender  Lab Results:  Recent Labs    12/10/16 0501 12/10/16 0900  WBC 4.9 3.8*  HGB 12.2 12.2  HCT 36.9 36.5  PLT 223 221   BMET Recent Labs    12/09/16 2353 12/10/16 0900  NA 136 140  K 4.1 3.8  CL 103 107  CO2 23 24  GLUCOSE 147* 94  BUN 12 9  CREATININE 0.36* 0.40*  CALCIUM 9.6 8.7*   Hepatic Function Latest Ref Rng & Units 12/10/2016 12/09/2016  Total Protein 6.5 - 8.1 g/dL 5.9(L) 7.3  Albumin 3.5 - 5.0 g/dL 2.8(L) 3.6  AST 15 - 41 U/L 175(H) 228(H)  ALT 14 - 54 U/L 384(H) 505(H)  Alk Phosphatase 38 - 126 U/L 228(H) 281(H)  Total Bilirubin 0.3 - 1.2 mg/dL 9.5(H) 13.6(H)   Lipase     Component Value Date/Time   LIPASE 1,764 (H) 12/10/2016 0501     Studies/Results: Dg Chest 2 View  Result Date: 12/10/2016 CLINICAL DATA:  61 year old female with shortness of breath. EXAM: CHEST  2 VIEW COMPARISON:  None. FINDINGS: The lungs are clear. There is no pleural effusion or pneumothorax. The cardiac silhouette is within normal limits. There is no acute osseous pathology. IMPRESSION: No active cardiopulmonary disease. Electronically Signed   By:  Anner Crete M.D.   On: 12/10/2016 01:05   Ct Abdomen Pelvis W Contrast  Result Date: 12/10/2016 CLINICAL DATA:  Unintended weight loss. Abdominal pain. Weakness and nausea. Jaundice. Back pain. Elevated liver function tests. EXAM: CT ABDOMEN AND PELVIS WITH CONTRAST TECHNIQUE: Multidetector CT imaging of the abdomen and pelvis was performed using the standard protocol following bolus administration of intravenous contrast. CONTRAST:  185m ISOVUE-300 IOPAMIDOL (ISOVUE-300) INJECTION 61% COMPARISON:  None. FINDINGS: LOWER CHEST: Dependent atelectasis. Included heart size is normal. No pericardial effusion. HEPATOBILIARY: Mild intrahepatic biliary dilatation. Liver is otherwise unremarkable. Distended gallbladder, subcentimeter layering faint suspected gallstones. Dilated cystic duct. Proximal Common bile duct is 9 mm. Subcentimeter intermediate density filling defect distal Common bile duct (coronal 87/191, sagittal 92/191). PANCREAS: Normal. SPLEEN: Normal. ADRENALS/URINARY TRACT: Kidneys are orthotopic, demonstrating symmetric enhancement. No nephrolithiasis, hydronephrosis or solid renal masses. The unopacified ureters are normal in course and caliber. Delayed imaging through the kidneys demonstrates symmetric prompt contrast excretion within the proximal urinary collecting system. Urinary bladder is partially distended and unremarkable. Normal adrenal glands. STOMACH/BOWEL: The stomach, small and large bowel are normal in course and caliber without inflammatory changes, sensitivity decreased without oral contrast. Mild sigmoid colonic diverticulosis. Normal appendix. VASCULAR/LYMPHATIC: Aortoiliac vessels are normal in course and caliber. Mild calcific atherosclerosis. No lymphadenopathy by CT size criteria. REPRODUCTIVE: 4.8 x 4.6 cm calcified RIGHT pelvic mass contiguous  with RIGHT adnexae and posterior uterus. Small similarly calcified mass within the uterus. OTHER: No intraperitoneal free fluid or  free air. MUSCULOSKELETAL: Nonacute. Mild degenerative change of the spine. Small fat containing umbilical hernia. IMPRESSION: 1. Intra and extra hepatic biliary dilatation to the level of the distal CBD were there is an obstructing gallstone, less likely mass. 2. Cholelithiasis without CT findings of acute cholecystitis. 3. 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 4. Acute findings discussed with and reconfirmed by Dr.DAVID YAO on 12/10/2016 at 2:20 am. Electronically Signed   By: Elon Alas M.D.   On: 12/10/2016 02:22   Dg Ercp Biliary & Pancreatic Ducts  Result Date: 12/10/2016 CLINICAL DATA:  Bile duct stone EXAM: ERCP TECHNIQUE: Multiple spot images obtained with the fluoroscopic device and submitted for interpretation post-procedure. FLUOROSCOPY TIME:  Fluoroscopy Time:  1 minutes and 42 seconds Radiation Exposure Index (if provided by the fluoroscopic device): Number of Acquired Spot Images: 4 COMPARISON:  None. FINDINGS: Contrast fills the biliary tree. Balloon stone retrieval is documented. IMPRESSION: See above. These images were submitted for radiologic interpretation only. Please see the procedural report for the amount of contrast and the fluoroscopy time utilized. Electronically Signed   By: Marybelle Killings M.D.   On: 12/10/2016 13:30    Anti-infectives: Anti-infectives (From admission, onward)   Start     Dose/Rate Route Frequency Ordered Stop   12/10/16 1300  ciprofloxacin (CIPRO) IVPB 400 mg     400 mg 200 mL/hr over 60 Minutes Intravenous 2 times daily 12/10/16 1217        Assessment/Plan: s/p Procedure(s): ENDOSCOPIC RETROGRADE CHOLANGIOPANCREATOGRAPHY (ERCP) Obstructing distal common bile duct stone with acute pancreatitis. Marked improvement following ERCP and stone extraction.  She will require laparoscopic cholecystectomy sometime over the next several days as her lipase and LFTs improve.   LOS: 1 day     Hildred Mollica T 12/11/2016

## 2016-12-11 NOTE — Progress Notes (Signed)
[facility] PROGRESS NOTE  Andrea Blackwell HDQ:[id number] DOB: 1955-06-27 DOA: 12/09/2016  PCP: Andrea Blackwell, No Pcp Per  Brief History/Interval Summary: 61 year old Caucasian female with a past medical history of hypothyroidism presented with complains of feeling sick and nauseated for the past couple of weeks with extremely poor appetite.  Has lost 15 pounds of the last 2 weeks.  Then she vomited blood the day prior to admission.  So she came into the emergency department.  Andrea Blackwell was found to have acute cholecystitis and possible choledocholithiasis.  She was hospitalized for further management.  Reason for Visit: Acute cholecystitis  Consultants: Gastroenterology.  General surgery.  Procedures:  ERCP Impression:                - The major papilla appeared normal. - The common bile duct was moderately dilated, with a stone causing an obstruction. - Choledocholithiasis was found. Complete removal  was accomplished by biliary sphincterotomy and balloon extraction. - A biliary sphincterotomy was performed. - The biliary tree was swept.    Antibiotics: Ciprofloxacin  Subjective/Interval History: Andrea Blackwell noted to be anxious this morning.  Complains of upper abdominal pain.  No nausea.  No vomiting.    ROS: Denies any headaches  Objective:  Vital Signs  Vitals:   12/10/16 1430 12/10/16 1443 12/10/16 2150 12/11/16 0338  BP: 131/62 (!) 127/53 (!) 133/57 (!) 127/57  Pulse: (!) 52 (!) 52 (!) 51 (!) 58  Resp: 15 12 14 16   Temp: 98.1 F (36.7 C) 98.4 F (36.9 C) 98.4 F (36.9 C) 97.6 F (36.4 C)  TempSrc:   Oral Oral  SpO2: 96% 98% 96% 98%  Weight:    94.5 kg (208 lb 5.4 oz)  Height:        Intake/Output Summary (Last 24 hours) at 12/11/2016 1336 Last data filed at 12/11/2016 1100 Gross per 24 hour  Intake 3263.75 ml  Output 2950 ml  Net 313.75 ml   Filed Weights   12/09/16 2341 12/11/16 0338  Weight: 90.3 kg (199 lb) 94.5 kg (208 lb 5.4 oz)    General appearance:  Awake alert.  In no distress Resp: Clear to auscultation bilaterally Cardio: S1-S2 is normal regular. GI: Abdomen is obese.  Soft.  Tender in the epigastric area.  No rebound rigidity or guarding.  No masses organomegaly. Neurologic: Awake alert.  No obvious focal neurological deficits.  Lab Results:  Data Reviewed: I have personally reviewed following labs and imaging studies  CBC: Recent Labs  Lab 12/09/16 2353 12/10/16 0501 12/10/16 0900 12/11/16 1011  WBC 8.7 4.9 3.8* 4.3  HGB 14.4 12.2 12.2 12.0  HCT 43.1 36.9 36.5 35.5*  MCV 91.5 92.0 91.7 92.0  PLT 280 223 221 119    Basic Metabolic Panel: Recent Labs  Lab 12/09/16 2353 12/10/16 0900 12/11/16 1011  NA 136 140 137  K 4.1 3.8 4.3  CL 103 107 106  CO2 23 24 22   GLUCOSE 147* 94 129*  BUN 12 9 10   CREATININE 0.36* 0.40* 0.53  CALCIUM 9.6 8.7* 8.6*    GFR: Estimated Creatinine Clearance: 88.6 mL/min (by C-G formula based on SCr of 0.53 mg/dL).  Liver Function Tests: Recent Labs  Lab 12/09/16 2353 12/10/16 0900 12/11/16 1011  AST 228* 175* 162*  ALT 505* 384* 334*  ALKPHOS 281* 228* 213*  BILITOT 13.6* 9.5* 9.1*  PROT 7.3 5.9* 5.6*  ALBUMIN 3.6 2.8* 2.7*    Recent Labs  Lab 12/10/16 0125 12/10/16 0501 12/11/16 0632  LIPASE 2,778*  1,764* 68*   Cardiac Enzymes: Recent Labs  Lab 12/10/16 0001  TROPONINI <0.03    CBG: Recent Labs  Lab 12/10/16 0909  GLUCAP 87    Lipid Profile: Recent Labs    12/10/16 0501  CHOL 279*  HDL 21*  LDLCALC 216*  TRIG 209*  CHOLHDL 13.3    Thyroid Function Tests: Recent Labs    12/10/16 0501  TSH 0.578      Radiology Studies: Dg Chest 2 View  Result Date: 12/10/2016 CLINICAL DATA:  61 year old female with shortness of breath. EXAM: CHEST  2 VIEW COMPARISON:  None. FINDINGS: The lungs are clear. There is no pleural effusion or pneumothorax. The cardiac silhouette is within normal limits. There is no acute osseous pathology. IMPRESSION: No active  cardiopulmonary disease. Electronically Signed   By: Anner Crete M.D.   On: 12/10/2016 01:05   Ct Abdomen Pelvis W Contrast  Result Date: 12/10/2016 CLINICAL DATA:  Unintended weight loss. Abdominal pain. Weakness and nausea. Jaundice. Back pain. Elevated liver function tests. EXAM: CT ABDOMEN AND PELVIS WITH CONTRAST TECHNIQUE: Multidetector CT imaging of the abdomen and pelvis was performed using the standard protocol following bolus administration of intravenous contrast. CONTRAST:  110mL ISOVUE-300 IOPAMIDOL (ISOVUE-300) INJECTION 61% COMPARISON:  None. FINDINGS: LOWER CHEST: Dependent atelectasis. Included heart size is normal. No pericardial effusion. HEPATOBILIARY: Mild intrahepatic biliary dilatation. Liver is otherwise unremarkable. Distended gallbladder, subcentimeter layering faint suspected gallstones. Dilated cystic duct. Proximal Common bile duct is 9 mm. Subcentimeter intermediate density filling defect distal Common bile duct (coronal 87/191, sagittal 92/191). PANCREAS: Normal. SPLEEN: Normal. ADRENALS/URINARY TRACT: Kidneys are orthotopic, demonstrating symmetric enhancement. No nephrolithiasis, hydronephrosis or solid renal masses. The unopacified ureters are normal in course and caliber. Delayed imaging through the kidneys demonstrates symmetric prompt contrast excretion within the proximal urinary collecting system. Urinary bladder is partially distended and unremarkable. Normal adrenal glands. STOMACH/BOWEL: The stomach, small and large bowel are normal in course and caliber without inflammatory changes, sensitivity decreased without oral contrast. Mild sigmoid colonic diverticulosis. Normal appendix. VASCULAR/LYMPHATIC: Aortoiliac vessels are normal in course and caliber. Mild calcific atherosclerosis. No lymphadenopathy by CT size criteria. REPRODUCTIVE: 4.8 x 4.6 cm calcified RIGHT pelvic mass contiguous with RIGHT adnexae and posterior uterus. Small similarly calcified mass within  the uterus. OTHER: No intraperitoneal free fluid or free air. MUSCULOSKELETAL: Nonacute. Mild degenerative change of the spine. Small fat containing umbilical hernia. IMPRESSION: 1. Intra and extra hepatic biliary dilatation to the level of the distal CBD were there is an obstructing gallstone, less likely mass. 2. Cholelithiasis without CT findings of acute cholecystitis. 3. 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 4. Acute findings discussed with and reconfirmed by Dr.DAVID YAO on 12/10/2016 at 2:20 am. Electronically Signed   By: Elon Alas M.D.   On: 12/10/2016 02:22   Dg Ercp Biliary & Pancreatic Ducts  Result Date: 12/10/2016 CLINICAL DATA:  Bile duct stone EXAM: ERCP TECHNIQUE: Multiple spot images obtained with the fluoroscopic device and submitted for interpretation post-procedure. FLUOROSCOPY TIME:  Fluoroscopy Time:  1 minutes and 42 seconds Radiation Exposure Index (if provided by the fluoroscopic device): Number of Acquired Spot Images: 4 COMPARISON:  None. FINDINGS: Contrast fills the biliary tree. Balloon stone retrieval is documented. IMPRESSION: See above. These images were submitted for radiologic interpretation only. Please see the procedural report for the amount of contrast and the fluoroscopy time utilized. Electronically Signed   By: Rodena Goldmann.D.  On: 12/10/2016 13:30     Medications:  Scheduled: . escitalopram  10 mg Oral QHS  . Influenza vac split quadrivalent PF  0.5 mL Intramuscular Tomorrow-1000  . levothyroxine  75 mcg Oral QAC breakfast   Continuous: . sodium chloride 150 mL/hr at 12/11/16 0823  . ciprofloxacin 400 mg (12/11/16 1054)  . pantoprozole (PROTONIX) infusion 8 mg/hr (12/11/16 0341)   ZOX:WRUEAVWU injection, ondansetron (ZOFRAN) IV, phenol  Assessment/Plan:  Active Problems:   Gallstone pancreatitis   Pancreatitis    Acute gallstone pancreatitis Continue IV fluids.   N.p.o.  Lipase level has improved.  Triglyceride level 209.  Acute cholecystitis with choledocholithiasis General surgery and gastroenterology consulted.  Andrea Blackwell is noted to have significant elevation in bilirubin and alkaline phosphatase.  Andrea Blackwell underwent ERCP with removal of stone and sphincterotomy.  LFTs are slightly better today.  Continue to monitor.  Continue ciprofloxacin.    Hematemesis Andrea Blackwell apparently vomited blood before she presented to the hospital.  No further episodes noted in the hospital.  Hemoglobin is stable.  She is not on anticoagulation at home.  She denies using NSAIDs.  PT and INR normal.  Change PPI to twice a day.   Hyperlipidemia Lipid panel noted.  Will need outpatient monitoring and management.  Calcified mass in the pelvis Noted incidentally on CT scan.  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  Outpatient management.  History of anxiety disorder Continue Lexapro.  History of hypothyroidism Continue levothyroxine.  TSH is normal.  DVT Prophylaxis: SCDs for now    Code Status: Full code Family Communication: Discussed with the Andrea Blackwell Disposition Plan: Management as outlined above.  Mobilize as tolerated.    LOS: 1 day   [facility] Pager [phone] 12/11/2016, 1:36 PM  If 7PM-7AM, please contact night-coverage at [email or web address], password [facility]

## 2016-12-12 ENCOUNTER — Encounter (HOSPITAL_COMMUNITY): Payer: Self-pay | Admitting: Gastroenterology

## 2016-12-12 ENCOUNTER — Telehealth: Payer: Self-pay

## 2016-12-12 DIAGNOSIS — K810 Acute cholecystitis: Secondary | ICD-10-CM

## 2016-12-12 LAB — COMPREHENSIVE METABOLIC PANEL
ALT: 354 U/L — ABNORMAL HIGH (ref 14–54)
ANION GAP: 8 (ref 5–15)
AST: 184 U/L — AB (ref 15–41)
Albumin: 2.8 g/dL — ABNORMAL LOW (ref 3.5–5.0)
Alkaline Phosphatase: 225 U/L — ABNORMAL HIGH (ref 38–126)
BILIRUBIN TOTAL: 8.5 mg/dL — AB (ref 0.3–1.2)
BUN: 9 mg/dL (ref 6–20)
CO2: 25 mmol/L (ref 22–32)
Calcium: 8.7 mg/dL — ABNORMAL LOW (ref 8.9–10.3)
Chloride: 108 mmol/L (ref 101–111)
Creatinine, Ser: 0.56 mg/dL (ref 0.44–1.00)
Glucose, Bld: 87 mg/dL (ref 65–99)
POTASSIUM: 4.2 mmol/L (ref 3.5–5.1)
Sodium: 141 mmol/L (ref 135–145)
TOTAL PROTEIN: 5.7 g/dL — AB (ref 6.5–8.1)

## 2016-12-12 LAB — CBC
HEMATOCRIT: 34.5 % — AB (ref 36.0–46.0)
Hemoglobin: 11.3 g/dL — ABNORMAL LOW (ref 12.0–15.0)
MCH: 30.4 pg (ref 26.0–34.0)
MCHC: 32.8 g/dL (ref 30.0–36.0)
MCV: 92.7 fL (ref 78.0–100.0)
Platelets: 193 10*3/uL (ref 150–400)
RBC: 3.72 MIL/uL — ABNORMAL LOW (ref 3.87–5.11)
RDW: 14.7 % (ref 11.5–15.5)
WBC: 4 10*3/uL (ref 4.0–10.5)

## 2016-12-12 LAB — SURGICAL PCR SCREEN
MRSA, PCR: POSITIVE — AB
Staphylococcus aureus: POSITIVE — AB

## 2016-12-12 MED ORDER — GABAPENTIN 300 MG PO CAPS
300.0000 mg | ORAL_CAPSULE | ORAL | Status: AC
  Administered 2016-12-13: 300 mg via ORAL

## 2016-12-12 MED ORDER — CHLORHEXIDINE GLUCONATE CLOTH 2 % EX PADS: 6.0000 | MEDICATED_PAD | Freq: Once | CUTANEOUS | Status: DC

## 2016-12-12 MED ORDER — MUPIROCIN 2 % EX OINT
1.0000 "application " | TOPICAL_OINTMENT | Freq: Two times a day (BID) | CUTANEOUS | Status: DC
  Administered 2016-12-12 – 2016-12-16 (×8): 1 via NASAL
  Filled 2016-12-12 (×2): qty 22

## 2016-12-12 MED ORDER — DEXTROSE 5 % IV SOLN
5.0000 mg/kg | INTRAVENOUS | Status: AC
  Filled 2016-12-12: qty 9.25

## 2016-12-12 MED ORDER — GABAPENTIN 300 MG PO CAPS: 300.0000 mg | ORAL_CAPSULE | ORAL | Status: AC

## 2016-12-12 MED ORDER — ACETAMINOPHEN 500 MG PO TABS: 1000.0000 mg | ORAL_TABLET | ORAL | Status: AC

## 2016-12-12 MED ORDER — CLINDAMYCIN PHOSPHATE 900 MG/50ML IV SOLN
900.0000 mg | INTRAVENOUS | Status: AC
  Filled 2016-12-12: qty 50

## 2016-12-12 MED ORDER — DEXAMETHASONE SODIUM PHOSPHATE 4 MG/ML IJ SOLN: 4.0000 mg | INTRAMUSCULAR | Status: AC

## 2016-12-12 MED ORDER — CHLORHEXIDINE GLUCONATE CLOTH 2 % EX PADS
6.0000 | MEDICATED_PAD | Freq: Once | CUTANEOUS | Status: AC
  Administered 2016-12-12: 6 via TOPICAL

## 2016-12-12 MED ORDER — CELECOXIB 200 MG PO CAPS
400.0000 mg | ORAL_CAPSULE | ORAL | Status: AC
  Administered 2016-12-13: 400 mg via ORAL
  Filled 2016-12-12: qty 2

## 2016-12-12 MED ORDER — ACETAMINOPHEN 500 MG PO TABS: 1000.0000 mg | ORAL_TABLET | ORAL | Status: DC

## 2016-12-12 MED ORDER — CHLORHEXIDINE GLUCONATE CLOTH 2 % EX PADS
6.0000 | MEDICATED_PAD | Freq: Every day | CUTANEOUS | Status: DC
  Administered 2016-12-13 – 2016-12-16 (×4): 6 via TOPICAL

## 2016-12-12 NOTE — Progress Notes (Signed)
   12/12/16 1600  Clinical Encounter Type  Visited With Patient  Visit Type Initial  Referral From Nurse  Consult/Referral To Chaplain  Spiritual Encounters  Spiritual Needs Emotional  Stress Factors  Patient Stress Factors Major life changes   Patient had asked to speak with a Chaplain.  I spent over an hour with this patient.  She needed to process where she is in her life with relationship changes and health changes.  Expressed not having any hope and unsure of the future, but towards the end of our conversation she began to express signs of feeling better and a more positive outlook.  Told her I would come back on Wednesday.  Chaplains available as needed. Chaplain Katherene Ponto

## 2016-12-12 NOTE — Progress Notes (Signed)
[facility] Progress Note  2 Days Post-Op  Subjective: CC:  Reports that drinking clear liquids last night made her epigastric pain worse. Denies nausea or vomiting. Endorses upper abdominal pain that is significantly improved compared to her admission, but still present.   Objective: Vital signs in last 24 hours: Temp:  [97.8 F (36.6 C)-98.2 F (36.8 C)] 98.1 F (36.7 C) (11/05 0548) Pulse Rate:  [59-64] 64 (11/05 0548) Resp:  [18] 18 (11/05 0548) BP: (113-131)/(52-94) 131/94 (11/05 0548) SpO2:  [97 %-99 %] 97 % (11/05 0548) Weight:  [94.4 kg (208 lb 1.8 oz)] 94.4 kg (208 lb 1.8 oz) (11/05 0500) Last BM Date: 12/10/16  Intake/Output from previous day: 11/04 0701 - 11/05 0700 In: 5620 [P.O.:1320; I.V.:3900; IV Piggyback:400] Out: 5200 [Urine:5200] Intake/Output this shift: No intake/output data recorded.  PE: Gen:  Alert, NAD, pleasant Eyes: pupils equal and round, icteric sclerae  Card:  Regular rate and rhythm Pulm:  Normal effort, clear to auscultation bilaterally Abd: Soft, TTP epigastrium and RUQ without rebound tenderness, +BS Skin: warm and dry, no rashes  Psych: A&Ox3   Lab Results:  Recent Labs    12/11/16 1011 12/12/16 0529  WBC 4.3 4.0  HGB 12.0 11.3*  HCT 35.5* 34.5*  PLT 210 193   BMET Recent Labs    12/11/16 1011 12/12/16 0529  NA 137 141  K 4.3 4.2  CL 106 108  CO2 22 25  GLUCOSE 129* 87  BUN 10 9  CREATININE 0.53 0.56  CALCIUM 8.6* 8.7*   PT/INR Recent Labs    12/10/16 1531  LABPROT 12.4  INR 0.93   CMP     Component Value Date/Time   NA 141 12/12/2016 0529   K 4.2 12/12/2016 0529   CL 108 12/12/2016 0529   CO2 25 12/12/2016 0529   GLUCOSE 87 12/12/2016 0529   BUN 9 12/12/2016 0529   CREATININE 0.56 12/12/2016 0529   CALCIUM 8.7 (L) 12/12/2016 0529   PROT 5.7 (L) 12/12/2016 0529   ALBUMIN 2.8 (L) 12/12/2016 0529   AST 184 (H) 12/12/2016 0529   ALT 354 (H) 12/12/2016 0529   ALKPHOS 225 (H) 12/12/2016 0529    BILITOT 8.5 (H) 12/12/2016 0529   GFRNONAA >60 12/12/2016 0529   GFRAA >60 12/12/2016 0529   Lipase     Component Value Date/Time   LIPASE 68 (H) 12/11/2016 7510       Studies/Results: No results found.  Anti-infectives: Anti-infectives (From admission, onward)   Start     Dose/Rate Route Frequency Ordered Stop   12/12/16 0715  clindamycin (CLEOCIN) IVPB 900 mg     900 mg 100 mL/hr over 30 Minutes Intravenous On call to O.R. 12/12/16 2585 12/13/16 0559   12/12/16 0715  gentamicin (GARAMYCIN) 370 mg in dextrose 5 % 100 mL IVPB     5 mg/kg  74.1 kg (Adjusted) 109.3 mL/hr over 60 Minutes Intravenous On call to O.R. 12/12/16 0712 12/13/16 0559   12/10/16 1300  ciprofloxacin (CIPRO) IVPB 400 mg     400 mg 200 mL/hr over 60 Minutes Intravenous 2 times daily 12/10/16 1217       Assessment/Plan Acute gallstone pancreatitis 2/2 choledocholithiasis   S/p ERCP w/ sphincterotomy 11/3 Dr. Benson Norway  - afebrile, VSS, WBC WNL; no signs of cholecystitis on imaging or labs  - AST 184, ALT 354, alk phos 225, total bilirubin 8.5 (from 9.1), Lipase 68  - clinically improving, still with mild tenderness. - ok to to have sips  of clears today; strict NPO after MN  FEN: NPO, IVF  ID: ciprofloxacin  VTE: SCD's  Plan: lipase and CMET in AM Tentatively plan for OR tomorrow for laparoscopic cholecystectomy    LOS: 2 days    Jill Alexanders , [facility] 12/12/2016, 2:19 PM Pager: [phone] Consults: [phone] Mon-Fri 7:00 am-4:30 pm Sat-Sun 7:00 am-11:30 am

## 2016-12-12 NOTE — Progress Notes (Signed)
CRITICAL VALUE ALERT  Critical Value: Positive MRSA-PCR and Staphylococcus aureus positive    Date & Time Notied:  12/12/16 @ 1402  Provider Notified:Dr. Maryland Pink   Orders Received/Actions taken: Standing order for MRSA placed.

## 2016-12-12 NOTE — Care Management Note (Signed)
Case Management Note  Patient Details  Name: Sahian Kerney MRN: [id number] Date of Birth: Feb 22, 1955  Subjective/Objective:  61 y/o f admitted w/Gallstone pancreatitis. From home. Patient states she will stay w/friends in [facility] @ d/c. No pcp, or health insurance. Provided w/[facility] pcp, liason Jane w/[facility] will follow for pcp appt, patient will be able to use [facility] pharmacy. Development worker, community following.Surgery following.                   Action/Plan:d/c plan home.   Expected Discharge Date:                  Expected Discharge Plan:  Home/Self Care  In-House Referral:     Discharge planning Services  CM Consult, Medication Assistance, [facility]  Post Acute Care Choice:    Choice offered to:     DME Arranged:    DME Agency:     HH Arranged:    HH Agency:     Status of Service:  In process, will continue to follow  If discussed at Long Length of Stay Meetings, dates discussed:    Additional Comments:  Dessa Phi, RN 12/12/2016, 2:05 PM

## 2016-12-12 NOTE — Telephone Encounter (Signed)
Call received from Dessa Phi, RN CM noting that the patient will need a hospital follow up appointment at [facility] when she is ready for discharge. Informed her that this CM can assist with scheduling an appointment at [facility] if one is available at the time of discharge.

## 2016-12-12 NOTE — Progress Notes (Signed)
[facility] PROGRESS NOTE  Andrea Blackwell TGG:[id number] DOB: 04/21/1955 DOA: 12/09/2016  PCP: Patient, No Pcp Per  Brief History/Interval Summary: 61 year old Caucasian female with a past medical history of hypothyroidism presented with complains of feeling sick and nauseated for the past couple of weeks with extremely poor appetite.  Has lost 15 pounds of the last 2 weeks.  Then she vomited blood the day prior to admission.  So she came into the emergency department.  Patient was found to have acute cholecystitis and possible choledocholithiasis.  She was hospitalized for further management.  Patient was seen by gastroenterology and surgery.  She underwent ERCP with stone removal.  Reason for Visit: Acute cholecystitis  Consultants: Gastroenterology.  General surgery.  Procedures:  ERCP Impression:                - The major papilla appeared normal. - The common bile duct was moderately dilated, with a stone causing an obstruction. - Choledocholithiasis was found. Complete removal  was accomplished by biliary sphincterotomy and balloon extraction. - A biliary sphincterotomy was performed. - The biliary tree was swept.   Antibiotics: Ciprofloxacin  Subjective/Interval History: She remains anxious.  Continues to have some upper abdominal discomfort. However no nausea vomiting.  She is scared of trying her liquid diet.    ROS: Denies any headaches.  Objective:  Vital Signs  Vitals:   12/11/16 1537 12/11/16 2055 12/12/16 0500 12/12/16 0548  BP: (!) 113/52 (!) 130/55  (!) 131/94  Pulse: (!) 59 60  64  Resp: 18 18  18   Temp: 98.2 F (36.8 C) 97.8 F (36.6 C)  98.1 F (36.7 C)  TempSrc: Oral Oral  Oral  SpO2: 99% 97%  97%  Weight:   94.4 kg (208 lb 1.8 oz)   Height:        Intake/Output Summary (Last 24 hours) at 12/12/2016 0850 Last data filed at 12/12/2016 0700 Gross per 24 hour  Intake 5620 ml  Output 5200 ml  Net 420 ml   Filed Weights   12/09/16 2341  12/11/16 0338 12/12/16 0500  Weight: 90.3 kg (199 lb) 94.5 kg (208 lb 5.4 oz) 94.4 kg (208 lb 1.8 oz)    General appearance: Awake alert.  In no distress Resp: Clear to auscultation bilaterally Cardio: S1-S2 is normal regular GI: Diminished obese.  Soft.  Tender in the epigastric area without any rebound rigidity or guarding.  No masses organomegaly.  Bowel sounds are present.   Neurologic: Awake alert.  No obvious focal neurological deficits.  Lab Results:  Data Reviewed: I have personally reviewed following labs and imaging studies  CBC: Recent Labs  Lab 12/09/16 2353 12/10/16 0501 12/10/16 0900 12/11/16 1011 12/12/16 0529  WBC 8.7 4.9 3.8* 4.3 4.0  HGB 14.4 12.2 12.2 12.0 11.3*  HCT 43.1 36.9 36.5 35.5* 34.5*  MCV 91.5 92.0 91.7 92.0 92.7  PLT 280 223 221 210 703    Basic Metabolic Panel: Recent Labs  Lab 12/09/16 2353 12/10/16 0900 12/11/16 1011 12/12/16 0529  NA 136 140 137 141  K 4.1 3.8 4.3 4.2  CL 103 107 106 108  CO2 23 24 22 25   GLUCOSE 147* 94 129* 87  BUN 12 9 10 9   CREATININE 0.36* 0.40* 0.53 0.56  CALCIUM 9.6 8.7* 8.6* 8.7*    GFR: Estimated Creatinine Clearance: 88.6 mL/min (by C-G formula based on SCr of 0.56 mg/dL).  Liver Function Tests: Recent Labs  Lab 12/09/16 2353 12/10/16 0900 12/11/16 1011 12/12/16 0529  AST 228* 175* 162* 184*  ALT 505* 384* 334* 354*  ALKPHOS 281* 228* 213* 225*  BILITOT 13.6* 9.5* 9.1* 8.5*  PROT 7.3 5.9* 5.6* 5.7*  ALBUMIN 3.6 2.8* 2.7* 2.8*    Recent Labs  Lab 12/10/16 0125 12/10/16 0501 12/11/16 0632  LIPASE 2,778* 1,764* 68*   Cardiac Enzymes: Recent Labs  Lab 12/10/16 0001  TROPONINI <0.03    CBG: Recent Labs  Lab 12/10/16 0909  GLUCAP 87    Lipid Profile: Recent Labs    12/10/16 0501  CHOL 279*  HDL 21*  LDLCALC 216*  TRIG 209*  CHOLHDL 13.3    Thyroid Function Tests: Recent Labs    12/10/16 0501  TSH 0.578      Radiology Studies: Dg Ercp Biliary & Pancreatic  Ducts  Result Date: 12/10/2016 CLINICAL DATA:  Bile duct stone EXAM: ERCP TECHNIQUE: Multiple spot images obtained with the fluoroscopic device and submitted for interpretation post-procedure. FLUOROSCOPY TIME:  Fluoroscopy Time:  1 minutes and 42 seconds Radiation Exposure Index (if provided by the fluoroscopic device): Number of Acquired Spot Images: 4 COMPARISON:  None. FINDINGS: Contrast fills the biliary tree. Balloon stone retrieval is documented. IMPRESSION: See above. These images were submitted for radiologic interpretation only. Please see the procedural report for the amount of contrast and the fluoroscopy time utilized. Electronically Signed   By: Marybelle Killings M.D.   On: 12/10/2016 13:30     Medications:  Scheduled: . acetaminophen  1,000 mg Oral On Call to OR  . Chlorhexidine Gluconate Cloth  6 each Topical Once   And  . Chlorhexidine Gluconate Cloth  6 each Topical Once  . escitalopram  10 mg Oral QHS  . gabapentin  300 mg Oral On Call to OR  . Influenza vac split quadrivalent PF  0.5 mL Intramuscular Tomorrow-1000  . levothyroxine  75 mcg Oral QAC breakfast  . pantoprazole (PROTONIX) IV  40 mg Intravenous Q12H   Continuous: . sodium chloride 150 mL/hr at 12/12/16 0508  . ciprofloxacin Stopped (12/11/16 2208)  . clindamycin (CLEOCIN) IV     And  . gentamicin     EXB:MWUXLKGM injection, ondansetron (ZOFRAN) IV, phenol  Assessment/Plan:  Active Problems:   Gallstone pancreatitis   Pancreatitis    Acute gallstone pancreatitis Needs to be improving slowly.  Continues to have some upper abdominal discomfort.  She is on a clear liquid diet.  Lipase level has improved.  Continue IV fluids.  Triglyceride level 209.  Acute cholecystitis with choledocholithiasis General surgery and gastroenterology going.  Patient is noted to have significant elevation in bilirubin and alkaline phosphatase.  Patient underwent ERCP with removal of stone and sphincterotomy.  Liver function  tests are slowly improving.  Further management per general surgery.  Continue ciprofloxacin.    Hematemesis Patient apparently vomited blood before she presented to the hospital.  No further episodes noted in the hospital.  Hemoglobin is stable.  She is not on anticoagulation at home.  She denies using NSAIDs.  PT and INR normal.  Continue PPI.  Hyperlipidemia Lipid panel noted.  Will need outpatient monitoring and management.  Calcified mass in the pelvis Noted incidentally on CT scan.  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  Outpatient management.  History of anxiety disorder Continue Lexapro.  History of hypothyroidism Continue levothyroxine.  TSH is normal.  DVT Prophylaxis: SCDs for now    Code Status: Full code Family Communication: Discussed with the patient Disposition Plan: Management  as outlined above.  Further plan per general surgery.    LOS: 2 days   [facility] Pager [phone] 12/12/2016, 8:50 AM  If 7PM-7AM, please contact night-coverage at [email or web address], password [email or web address]

## 2016-12-13 ENCOUNTER — Inpatient Hospital Stay (HOSPITAL_COMMUNITY): Payer: Self-pay | Admitting: Certified Registered Nurse Anesthetist

## 2016-12-13 ENCOUNTER — Encounter (HOSPITAL_COMMUNITY): Payer: Self-pay | Admitting: Anesthesiology

## 2016-12-13 ENCOUNTER — Encounter (HOSPITAL_COMMUNITY)
Admission: EM | Disposition: A | Discharge status: Home Health | Payer: Self-pay | Source: Home / Self Care | Attending: Internal Medicine

## 2016-12-13 ENCOUNTER — Inpatient Hospital Stay (HOSPITAL_COMMUNITY): Payer: Self-pay

## 2016-12-13 DIAGNOSIS — E669 Obesity, unspecified: Secondary | ICD-10-CM

## 2016-12-13 DIAGNOSIS — R17 Unspecified jaundice: Secondary | ICD-10-CM

## 2016-12-13 DIAGNOSIS — E039 Hypothyroidism, unspecified: Secondary | ICD-10-CM

## 2016-12-13 DIAGNOSIS — K8000 Calculus of gallbladder with acute cholecystitis without obstruction: Secondary | ICD-10-CM

## 2016-12-13 HISTORY — PX: LAPAROSCOPIC CHOLECYSTECTOMY SINGLE SITE WITH INTRAOPERATIVE CHOLANGIOGRAM: SHX6538

## 2016-12-13 LAB — COMPREHENSIVE METABOLIC PANEL
ALK PHOS: 269 U/L — AB (ref 38–126)
ALT: 505 U/L — AB (ref 14–54)
AST: 295 U/L — ABNORMAL HIGH (ref 15–41)
Albumin: 3.2 g/dL — ABNORMAL LOW (ref 3.5–5.0)
Anion gap: 11 (ref 5–15)
BUN: 6 mg/dL (ref 6–20)
CALCIUM: 9.3 mg/dL (ref 8.9–10.3)
CHLORIDE: 101 mmol/L (ref 101–111)
CO2: 28 mmol/L (ref 22–32)
CREATININE: 0.63 mg/dL (ref 0.44–1.00)
Glucose, Bld: 111 mg/dL — ABNORMAL HIGH (ref 65–99)
Potassium: 4.2 mmol/L (ref 3.5–5.1)
Sodium: 140 mmol/L (ref 135–145)
Total Bilirubin: 10.1 mg/dL — ABNORMAL HIGH (ref 0.3–1.2)
Total Protein: 6.6 g/dL (ref 6.5–8.1)

## 2016-12-13 LAB — LIPASE, BLOOD: LIPASE: 43 U/L (ref 11–51)

## 2016-12-13 LAB — AMMONIA: Ammonia: 24 umol/L (ref 9–35)

## 2016-12-13 SURGERY — LAPAROSCOPIC CHOLECYSTECTOMY SINGLE SITE WITH INTRAOPERATIVE CHOLANGIOGRAM
Anesthesia: General

## 2016-12-13 MED ORDER — DIPHENHYDRAMINE HCL 25 MG PO CAPS
25.0000 mg | ORAL_CAPSULE | Freq: Four times a day (QID) | ORAL | Status: DC | PRN
Start: 2016-12-13 — End: 2016-12-16

## 2016-12-13 MED ORDER — ONDANSETRON HCL 4 MG/2ML IJ SOLN
INTRAMUSCULAR | Status: AC
  Filled 2016-12-13: qty 2

## 2016-12-13 MED ORDER — CELECOXIB 200 MG PO CAPS
ORAL_CAPSULE | ORAL | Status: AC
  Administered 2016-12-13: 400 mg via ORAL
  Filled 2016-12-13: qty 2

## 2016-12-13 MED ORDER — BUPIVACAINE-EPINEPHRINE (PF) 0.25% -1:200000 IJ SOLN
INTRAMUSCULAR | Status: AC
  Filled 2016-12-13: qty 30

## 2016-12-13 MED ORDER — FENTANYL CITRATE (PF) 100 MCG/2ML IJ SOLN
INTRAMUSCULAR | Status: AC
  Filled 2016-12-13: qty 2

## 2016-12-13 MED ORDER — PSYLLIUM 95 % PO PACK
1.0000 | PACK | Freq: Every day | ORAL | Status: DC
  Administered 2016-12-13 – 2016-12-16 (×3): 1 via ORAL
  Filled 2016-12-13 (×5): qty 1

## 2016-12-13 MED ORDER — GABAPENTIN 300 MG PO CAPS
300.0000 mg | ORAL_CAPSULE | Freq: Every day | ORAL | Status: AC
  Administered 2016-12-13 – 2016-12-15 (×3): 300 mg via ORAL
  Filled 2016-12-13 (×3): qty 1

## 2016-12-13 MED ORDER — METOPROLOL TARTRATE 5 MG/5ML IV SOLN: 5.0000 mg | Freq: Four times a day (QID) | INTRAVENOUS | Status: DC | PRN

## 2016-12-13 MED ORDER — CIPROFLOXACIN IN D5W 400 MG/200ML IV SOLN
INTRAVENOUS | Status: AC
  Filled 2016-12-13: qty 200

## 2016-12-13 MED ORDER — ACETAMINOPHEN 500 MG PO TABS
ORAL_TABLET | ORAL | Status: AC
  Filled 2016-12-13: qty 2

## 2016-12-13 MED ORDER — DEXAMETHASONE SODIUM PHOSPHATE 4 MG/ML IJ SOLN
INTRAMUSCULAR | Status: DC | PRN
  Administered 2016-12-13: 10 mg via INTRAVENOUS

## 2016-12-13 MED ORDER — DIPHENHYDRAMINE HCL 50 MG/ML IJ SOLN: 12.5000 mg | Freq: Four times a day (QID) | INTRAMUSCULAR | Status: DC | PRN

## 2016-12-13 MED ORDER — OXYCODONE HCL 5 MG PO TABS: 5.0000 mg | ORAL_TABLET | Freq: Four times a day (QID) | ORAL | 0 refills | Status: DC | PRN

## 2016-12-13 MED ORDER — PROPOFOL 10 MG/ML IV BOLUS
INTRAVENOUS | Status: DC | PRN
  Administered 2016-12-13: 150 mg via INTRAVENOUS

## 2016-12-13 MED ORDER — KETOROLAC TROMETHAMINE 30 MG/ML IJ SOLN
30.0000 mg | Freq: Once | INTRAMUSCULAR | Status: DC | PRN
  Administered 2016-12-13: 30 mg via INTRAVENOUS

## 2016-12-13 MED ORDER — HYDRALAZINE HCL 20 MG/ML IJ SOLN: 5.0000 mg | Freq: Four times a day (QID) | INTRAMUSCULAR | Status: DC | PRN

## 2016-12-13 MED ORDER — ROCURONIUM BROMIDE 50 MG/5ML IV SOSY
PREFILLED_SYRINGE | INTRAVENOUS | Status: AC
  Filled 2016-12-13: qty 5

## 2016-12-13 MED ORDER — MEPERIDINE HCL 50 MG/ML IJ SOLN: 6.2500 mg | INTRAMUSCULAR | Status: DC | PRN

## 2016-12-13 MED ORDER — OXYCODONE HCL 5 MG PO TABS
5.0000 mg | ORAL_TABLET | ORAL | Status: DC | PRN
  Administered 2016-12-13: 5 mg via ORAL
  Administered 2016-12-14: 10 mg via ORAL
  Administered 2016-12-16: 5 mg via ORAL
  Administered 2016-12-16: 10 mg via ORAL
  Filled 2016-12-13 (×2): qty 2
  Filled 2016-12-13 (×2): qty 1

## 2016-12-13 MED ORDER — 0.9 % SODIUM CHLORIDE (POUR BTL) OPTIME
TOPICAL | Status: DC | PRN
  Administered 2016-12-13 (×2): 1000 mL

## 2016-12-13 MED ORDER — CIPROFLOXACIN IN D5W 400 MG/200ML IV SOLN: 400.0000 mg | INTRAVENOUS | Status: DC

## 2016-12-13 MED ORDER — PANTOPRAZOLE SODIUM 40 MG PO TBEC
40.0000 mg | DELAYED_RELEASE_TABLET | Freq: Every day | ORAL | Status: DC
  Administered 2016-12-14 – 2016-12-16 (×3): 40 mg via ORAL
  Filled 2016-12-13 (×3): qty 1

## 2016-12-13 MED ORDER — PHENOL 1.4 % MT LIQD: 1.0000 | OROMUCOSAL | Status: DC | PRN

## 2016-12-13 MED ORDER — HYDROCORTISONE 2.5 % RE CREA: 1.0000 "application " | TOPICAL_CREAM | Freq: Four times a day (QID) | RECTAL | Status: DC | PRN

## 2016-12-13 MED ORDER — LIP MEDEX EX OINT
1.0000 "application " | TOPICAL_OINTMENT | Freq: Two times a day (BID) | CUTANEOUS | Status: DC
  Administered 2016-12-13 – 2016-12-16 (×6): 1 via TOPICAL
  Filled 2016-12-13: qty 7

## 2016-12-13 MED ORDER — METRONIDAZOLE IN NACL 5-0.79 MG/ML-% IV SOLN
500.0000 mg | INTRAVENOUS | Status: AC
  Administered 2016-12-13: 500 mg via INTRAVENOUS
  Filled 2016-12-13 (×2): qty 100

## 2016-12-13 MED ORDER — SUGAMMADEX SODIUM 200 MG/2ML IV SOLN
INTRAVENOUS | Status: AC
  Filled 2016-12-13: qty 2

## 2016-12-13 MED ORDER — LACTATED RINGERS IV SOLN
INTRAVENOUS | Status: DC
  Administered 2016-12-13 – 2016-12-16 (×4): via INTRAVENOUS

## 2016-12-13 MED ORDER — ROCURONIUM BROMIDE 10 MG/ML (PF) SYRINGE
PREFILLED_SYRINGE | INTRAVENOUS | Status: DC | PRN
  Administered 2016-12-13: 50 mg via INTRAVENOUS
  Administered 2016-12-13: 10 mg via INTRAVENOUS

## 2016-12-13 MED ORDER — PROMETHAZINE HCL 25 MG/ML IJ SOLN: 6.2500 mg | INTRAMUSCULAR | Status: DC | PRN

## 2016-12-13 MED ORDER — BISACODYL 10 MG RE SUPP: 10.0000 mg | Freq: Two times a day (BID) | RECTAL | Status: DC | PRN

## 2016-12-13 MED ORDER — ALUM & MAG HYDROXIDE-SIMETH 200-200-20 MG/5ML PO SUSP: 30.0000 mL | Freq: Four times a day (QID) | ORAL | Status: DC | PRN

## 2016-12-13 MED ORDER — MAGIC MOUTHWASH
15.0000 mL | Freq: Four times a day (QID) | ORAL | Status: DC | PRN
  Filled 2016-12-13: qty 15

## 2016-12-13 MED ORDER — FENTANYL CITRATE (PF) 100 MCG/2ML IJ SOLN
INTRAMUSCULAR | Status: DC | PRN
  Administered 2016-12-13 (×5): 50 ug via INTRAVENOUS
  Administered 2016-12-13: 100 ug via INTRAVENOUS
  Administered 2016-12-13 (×2): 50 ug via INTRAVENOUS

## 2016-12-13 MED ORDER — FENTANYL CITRATE (PF) 100 MCG/2ML IJ SOLN
25.0000 ug | INTRAMUSCULAR | Status: DC | PRN
  Administered 2016-12-13 (×2): 50 ug via INTRAVENOUS

## 2016-12-13 MED ORDER — GUAIFENESIN-DM 100-10 MG/5ML PO SYRP: 10.0000 mL | ORAL_SOLUTION | ORAL | Status: DC | PRN

## 2016-12-13 MED ORDER — ONDANSETRON HCL 4 MG/2ML IJ SOLN
INTRAMUSCULAR | Status: DC | PRN
  Administered 2016-12-13: 4 mg via INTRAVENOUS

## 2016-12-13 MED ORDER — SUGAMMADEX SODIUM 200 MG/2ML IV SOLN
INTRAVENOUS | Status: DC | PRN
  Administered 2016-12-13: 200 mg via INTRAVENOUS

## 2016-12-13 MED ORDER — MIDAZOLAM HCL 2 MG/2ML IJ SOLN
INTRAMUSCULAR | Status: AC
  Filled 2016-12-13: qty 2

## 2016-12-13 MED ORDER — LIDOCAINE 2% (20 MG/ML) 5 ML SYRINGE
INTRAMUSCULAR | Status: AC
  Filled 2016-12-13: qty 5

## 2016-12-13 MED ORDER — SACCHAROMYCES BOULARDII 250 MG PO CAPS
250.0000 mg | ORAL_CAPSULE | Freq: Two times a day (BID) | ORAL | Status: DC
  Administered 2016-12-13 – 2016-12-16 (×6): 250 mg via ORAL
  Filled 2016-12-13 (×6): qty 1

## 2016-12-13 MED ORDER — ATROPINE SULFATE 0.4 MG/ML IJ SOLN
INTRAMUSCULAR | Status: DC | PRN
  Administered 2016-12-13: 0.4 mg via INTRAVENOUS

## 2016-12-13 MED ORDER — SODIUM CHLORIDE 0.9 % IV SOLN
INTRAVENOUS | Status: DC | PRN
  Administered 2016-12-13: 16:00:00 via INTRAVENOUS

## 2016-12-13 MED ORDER — BUPIVACAINE-EPINEPHRINE 0.25% -1:200000 IJ SOLN
INTRAMUSCULAR | Status: DC | PRN
Start: 2016-12-13 — End: 2016-12-13
  Administered 2016-12-13: 60 mL

## 2016-12-13 MED ORDER — DEXAMETHASONE SODIUM PHOSPHATE 10 MG/ML IJ SOLN
INTRAMUSCULAR | Status: AC
  Filled 2016-12-13: qty 1

## 2016-12-13 MED ORDER — FENTANYL CITRATE (PF) 250 MCG/5ML IJ SOLN
INTRAMUSCULAR | Status: AC
Start: 2016-12-13 — End: ?
  Filled 2016-12-13: qty 5

## 2016-12-13 MED ORDER — LACTATED RINGERS IV SOLN
INTRAVENOUS | Status: DC | PRN
  Administered 2016-12-13: 18:00:00 via INTRAVENOUS

## 2016-12-13 MED ORDER — LABETALOL HCL 5 MG/ML IV SOLN
INTRAVENOUS | Status: DC | PRN
  Administered 2016-12-13 (×3): 5 mg via INTRAVENOUS

## 2016-12-13 MED ORDER — BISMUTH SUBSALICYLATE 262 MG/15ML PO SUSP: 30.0000 mL | Freq: Three times a day (TID) | ORAL | Status: DC | PRN

## 2016-12-13 MED ORDER — MIDAZOLAM HCL 5 MG/5ML IJ SOLN
INTRAMUSCULAR | Status: DC | PRN
  Administered 2016-12-13 (×4): 0.5 mg via INTRAVENOUS

## 2016-12-13 MED ORDER — IOPAMIDOL (ISOVUE-300) INJECTION 61%
INTRAVENOUS | Status: DC | PRN
  Administered 2016-12-13: 5 mL

## 2016-12-13 MED ORDER — LIDOCAINE 2% (20 MG/ML) 5 ML SYRINGE
INTRAMUSCULAR | Status: DC | PRN
  Administered 2016-12-13: 100 mg via INTRAVENOUS

## 2016-12-13 MED ORDER — IOPAMIDOL (ISOVUE-300) INJECTION 61%
INTRAVENOUS | Status: AC
  Filled 2016-12-13: qty 50

## 2016-12-13 MED ORDER — GABAPENTIN 300 MG PO CAPS
ORAL_CAPSULE | ORAL | Status: AC
  Administered 2016-12-13: 300 mg via ORAL
  Filled 2016-12-13: qty 1

## 2016-12-13 MED ORDER — LACTATED RINGERS IV BOLUS (SEPSIS): 1000.0000 mL | Freq: Three times a day (TID) | INTRAVENOUS | Status: AC | PRN

## 2016-12-13 MED ORDER — KETOROLAC TROMETHAMINE 30 MG/ML IJ SOLN
INTRAMUSCULAR | Status: AC
  Filled 2016-12-13: qty 1

## 2016-12-13 MED ORDER — MENTHOL 3 MG MT LOZG: 1.0000 | LOZENGE | OROMUCOSAL | Status: DC | PRN

## 2016-12-13 MED ORDER — METOCLOPRAMIDE HCL 5 MG/ML IJ SOLN: 5.0000 mg | Freq: Four times a day (QID) | INTRAMUSCULAR | Status: DC | PRN

## 2016-12-13 MED ORDER — METHOCARBAMOL 1000 MG/10ML IJ SOLN
1000.0000 mg | Freq: Four times a day (QID) | INTRAMUSCULAR | Status: DC | PRN
  Filled 2016-12-13: qty 10

## 2016-12-13 MED ORDER — HYDROMORPHONE HCL 1 MG/ML IJ SOLN: 0.5000 mg | INTRAMUSCULAR | Status: DC | PRN

## 2016-12-13 MED ORDER — LACTATED RINGERS IR SOLN
Status: DC | PRN
  Administered 2016-12-13: 1000 mL

## 2016-12-13 MED ORDER — HYDROCORTISONE 1 % EX CREA: 1.0000 "application " | TOPICAL_CREAM | Freq: Three times a day (TID) | CUTANEOUS | Status: DC | PRN

## 2016-12-13 MED ORDER — PROCHLORPERAZINE EDISYLATE 5 MG/ML IJ SOLN: 5.0000 mg | INTRAMUSCULAR | Status: DC | PRN

## 2016-12-13 SURGICAL SUPPLY — 43 items
APPLIER CLIP 5 13 M/L LIGAMAX5 (MISCELLANEOUS) ×3
CABLE HIGH FREQUENCY MONO STRZ (ELECTRODE) ×3 IMPLANT
CHLORAPREP W/TINT 26ML (MISCELLANEOUS) ×3 IMPLANT
CLIP APPLIE 5 13 M/L LIGAMAX5 (MISCELLANEOUS) ×1 IMPLANT
COVER MAYO STAND STRL (DRAPES) ×3 IMPLANT
COVER SURGICAL LIGHT HANDLE (MISCELLANEOUS) ×3 IMPLANT
DRAIN CHANNEL 19F RND (DRAIN) ×3 IMPLANT
DRAPE C-ARM 42X120 X-RAY (DRAPES) ×3 IMPLANT
DRAPE WARM FLUID 44X44 (DRAPE) ×3 IMPLANT
DRSG TEGADERM 2-3/8X2-3/4 SM (GAUZE/BANDAGES/DRESSINGS) ×3 IMPLANT
DRSG TEGADERM 4X4.75 (GAUZE/BANDAGES/DRESSINGS) ×3 IMPLANT
DRSG TELFA 3X8 NADH (GAUZE/BANDAGES/DRESSINGS) ×3 IMPLANT
ELECT REM PT RETURN 15FT ADLT (MISCELLANEOUS) ×3 IMPLANT
ENDOLOOP SUT PDS II  0 18 (SUTURE) ×4
ENDOLOOP SUT PDS II 0 18 (SUTURE) ×2 IMPLANT
EVACUATOR SILICONE 100CC (DRAIN) ×3 IMPLANT
GAUZE SPONGE 2X2 8PLY STRL LF (GAUZE/BANDAGES/DRESSINGS) ×1 IMPLANT
GLOVE ECLIPSE 8.0 STRL XLNG CF (GLOVE) ×3 IMPLANT
GLOVE INDICATOR 8.0 STRL GRN (GLOVE) ×3 IMPLANT
GOWN STRL REUS W/TWL XL LVL3 (GOWN DISPOSABLE) ×6 IMPLANT
IRRIG SUCT STRYKERFLOW 2 WTIP (MISCELLANEOUS) ×3
IRRIGATION SUCT STRKRFLW 2 WTP (MISCELLANEOUS) ×1 IMPLANT
KIT BASIN OR (CUSTOM PROCEDURE TRAY) ×3 IMPLANT
NEEDLE BIOPSY 14X6 SOFT TISS (NEEDLE) ×3 IMPLANT
PAD POSITIONING PINK XL (MISCELLANEOUS) ×3 IMPLANT
POSITIONER SURGICAL ARM (MISCELLANEOUS) IMPLANT
POUCH RETRIEVAL ECOSAC 10 (ENDOMECHANICALS) ×1 IMPLANT
POUCH RETRIEVAL ECOSAC 10MM (ENDOMECHANICALS) ×2
POUCH SPECIMEN RETRIEVAL 10MM (ENDOMECHANICALS) IMPLANT
SCISSORS LAP 5X35 DISP (ENDOMECHANICALS) ×3 IMPLANT
SET CHOLANGIOGRAPH MIX (MISCELLANEOUS) ×3 IMPLANT
SHEARS HARMONIC ACE PLUS 36CM (ENDOMECHANICALS) ×3 IMPLANT
SPONGE GAUZE 2X2 STER 10/PKG (GAUZE/BANDAGES/DRESSINGS) ×2
SUT MNCRL AB 4-0 PS2 18 (SUTURE) ×3 IMPLANT
SUT PDS AB 1 CT1 27 (SUTURE) ×9 IMPLANT
SUT PROLENE 2 0 BLUE (SUTURE) ×3 IMPLANT
SYR 20CC LL (SYRINGE) ×3 IMPLANT
TOWEL OR 17X26 10 PK STRL BLUE (TOWEL DISPOSABLE) ×3 IMPLANT
TOWEL OR NON WOVEN STRL DISP B (DISPOSABLE) ×3 IMPLANT
TRAY LAPAROSCOPIC (CUSTOM PROCEDURE TRAY) ×3 IMPLANT
TROCAR BLADELESS OPT 5 100 (ENDOMECHANICALS) ×3 IMPLANT
TROCAR BLADELESS OPT 5 150 (ENDOMECHANICALS) ×3 IMPLANT
TUBING INSUF HEATED (TUBING) ×3 IMPLANT

## 2016-12-13 NOTE — H&P (View-Only) (Signed)
[place]  [address] [place], [place] [address] Phone: [phone]  FAX: [phone]      Andrea Blackwell [id number] 18-Nov-1955  CARE TEAM:  PCP: Patient, No Pcp Per  Outpatient Care Team: Patient Care Team: Patient, No Pcp Per as PCP - General (General Practice)  Inpatient Treatment Team: Treatment Team: Attending Provider: Bonnielee Haff, MD; Rounding Team: Ian Bushman, MD; Consulting Physician: Edison Pace, Md, MD; Technician: Darrick Grinder, NT; Technician: Lucila Maine, NT; Consulting Physician: Carol Ada, MD; Registered Nurse: Gertie Fey, RN; Technician: Demaris Callander, NT; Registered Nurse: Tacey Ruiz, RN; Case Manager: Dessa Phi, RN   Problem List:   Active Problems:   Gallstone pancreatitis   Pancreatitis   3 Days Post-Op  12/10/2016  Procedure(s): ENDOSCOPIC RETROGRADE CHOLANGIOPANCREATOGRAPHY (ERCP)    Assessment/Plan Acute gallstone pancreatitis 2/2 choledocholithiasis   S/p ERCP w/ sphincterotomy 11/3 Dr. Benson [place]   While feeling better, her liver function tests remain elevated.  Perhaps related to edema at the ampulla.  Proceed with cholecystectomy and cholangiogram today.  Probably for intraoperative core liver biopsies as well.  She is ready to proceed.   The anatomy & physiology of hepatobiliary & pancreatic function was discussed.  The pathophysiology of gallbladder dysfunction was discussed.  Natural history risks without surgery was discussed.   I feel the risks of no intervention will lead to serious problems that outweigh the operative risks; therefore, I recommended cholecystectomy to remove the pathology.  I explained laparoscopic techniques with possible need for an open approach.  Probable cholangiogram to evaluate the bilary tract was explained as well.    Risks such as bleeding, infection, abscess, leak, injury to other organs, need for repair of tissues / organs, need for  further treatment, stroke, heart attack, death, and other risks were discussed.  I noted a good likelihood this will help address the problem.  Possibility that this will not correct all abdominal symptoms was explained.  Goals of post-operative recovery were discussed as well.  We will work to minimize complications.  An educational handout further explaining the pathology and treatment options was given as well.  Questions were answered.  The patient expresses understanding & wishes to proceed with surgery.   Heartburn/GERD: Switch Protonix to p.o. FEN: NPO, IVF  ID: ciprofloxacin  VTE: SCD's Jaundice: Hopefully will resolve.  If not improved or concerns interoperatively, reconsult gastroenterology.   30 minutes spent in review, evaluation, examination, counseling, and coordination of care.  More than 50% of that time was spent in counseling.  [name], M.D., F.A.C.S. Gastrointestinal and Minimally Invasive Surgery [facility] [address] [place], [place] [address] [phone] Main / Paging   12/13/2016    Subjective: (Chief complaint)  Less sore.  Wishes to proceed with surgery.  Objective:  Vital signs:  Vitals:   12/12/16 0548 12/12/16 1618 12/12/16 2026 12/13/16 0540  BP: (!) 131/94 (!) 156/76 132/69 (!) 142/60  Pulse: 64 62 61 67  Resp: '18 17 16 18  ' Temp: 98.1 F (36.7 C) 98.6 F (37 C) 98.5 F (36.9 C) 98.5 F (36.9 C)  TempSrc: Oral Oral Oral Oral  SpO2: 97% 97% 94% 93%  Weight:    92.2 kg (203 lb 4.2 oz)  Height:        Last BM Date: 12/11/16  Intake/Output   Yesterday:  11/05 0701 - 11/06 0700 In: 1237.5 [P.O.:60; I.V.:777.5; IV Piggyback:400] Out: 5300 [Urine:5300] This shift:  Total I/O In: -  Out: 300 [Urine:300]  Bowel function:  Flatus: YES  BM:  YES  Drain: (No drain)   Physical Exam:  General: Pt awake/alert/oriented x4 in no acute distress Eyes: PERRL, normal EOM.  Sclera clear.  +  icterus Neuro: CN II-XII intact w/o focal sensory/motor deficits. Lymph: No head/neck/groin lymphadenopathy Psych:  No delerium/psychosis/paranoia HENT: Normocephalic, Mucus membranes moist.  No thrush Neck: Supple, No tracheal deviation Chest: No chest wall pain w good excursion CV:  Pulses intact.  Regular rhythm MS: Normal AROM mjr joints.  No obvious deformity  Abdomen: Soft.  Mildy distended.  Tenderness at upeer abdomen only.  No evidence of peritonitis.  No incarcerated hernias.  Ext:   No deformity.  No mjr edema.  No cyanosis Skin: No petechiae / purpura.  Very jaundiced  Results:   Labs: Results for orders placed or performed during the hospital encounter of 12/09/16 (from the past 48 hour(s))  CBC     Status: Abnormal   Collection Time: 12/12/16  5:29 AM  Result Value Ref Range   WBC 4.0 4.0 - 10.5 K/uL   RBC 3.72 (L) 3.87 - 5.11 MIL/uL   Hemoglobin 11.3 (L) 12.0 - 15.0 g/dL   HCT 34.5 (L) 36.0 - 46.0 %   MCV 92.7 78.0 - 100.0 fL   MCH 30.4 26.0 - 34.0 pg   MCHC 32.8 30.0 - 36.0 g/dL   RDW 14.7 11.5 - 15.5 %   Platelets 193 150 - 400 K/uL  Comprehensive metabolic panel     Status: Abnormal   Collection Time: 12/12/16  5:29 AM  Result Value Ref Range   Sodium 141 135 - 145 mmol/L   Potassium 4.2 3.5 - 5.1 mmol/L   Chloride 108 101 - 111 mmol/L   CO2 25 22 - 32 mmol/L   Glucose, Bld 87 65 - 99 mg/dL   BUN 9 6 - 20 mg/dL   Creatinine, Ser 0.56 0.44 - 1.00 mg/dL   Calcium 8.7 (L) 8.9 - 10.3 mg/dL   Total Protein 5.7 (L) 6.5 - 8.1 g/dL   Albumin 2.8 (L) 3.5 - 5.0 g/dL   AST 184 (H) 15 - 41 U/L   ALT 354 (H) 14 - 54 U/L   Alkaline Phosphatase 225 (H) 38 - 126 U/L   Total Bilirubin 8.5 (H) 0.3 - 1.2 mg/dL   GFR calc non Af Amer >60 >60 mL/min   GFR calc Af Amer >60 >60 mL/min    Comment: (NOTE) The eGFR has been calculated using the CKD EPI equation. This calculation has not been validated in all clinical situations. eGFR's persistently <60 mL/min signify  possible Chronic Kidney Disease.    Anion gap 8 5 - 15  Surgical pcr screen     Status: Abnormal   Collection Time: 12/12/16 10:32 AM  Result Value Ref Range   MRSA, PCR POSITIVE (A) NEGATIVE    Comment: RESULT CALLED TO, READ BACK BY AND VERIFIED WITH: ORAEGBUNAN,I. RN '@[id number]'  ON 11.05.18 BY COHEN,K    Staphylococcus aureus POSITIVE (A) NEGATIVE    Comment: (NOTE) The Xpert SA Assay (FDA approved for NASAL specimens in patients 38 years of age and older), is one component of a comprehensive surveillance program. It is not intended to diagnose infection nor to guide or monitor treatment.   Comprehensive metabolic panel     Status: Abnormal   Collection Time: 12/13/16  5:32 AM  Result Value Ref Range   Sodium 140 135 - 145  mmol/L   Potassium 4.2 3.5 - 5.1 mmol/L   Chloride 101 101 - 111 mmol/L   CO2 28 22 - 32 mmol/L   Glucose, Bld 111 (H) 65 - 99 mg/dL   BUN 6 6 - 20 mg/dL   Creatinine, Ser 0.63 0.44 - 1.00 mg/dL   Calcium 9.3 8.9 - 10.3 mg/dL   Total Protein 6.6 6.5 - 8.1 g/dL   Albumin 3.2 (L) 3.5 - 5.0 g/dL   AST 295 (H) 15 - 41 U/L   ALT 505 (H) 14 - 54 U/L   Alkaline Phosphatase 269 (H) 38 - 126 U/L   Total Bilirubin 10.1 (H) 0.3 - 1.2 mg/dL   GFR calc non Af Amer >60 >60 mL/min   GFR calc Af Amer >60 >60 mL/min    Comment: (NOTE) The eGFR has been calculated using the CKD EPI equation. This calculation has not been validated in all clinical situations. eGFR's persistently <60 mL/min signify possible Chronic Kidney Disease.    Anion gap 11 5 - 15  Lipase, blood     Status: None   Collection Time: 12/13/16  5:32 AM  Result Value Ref Range   Lipase 43 11 - 51 U/L    Imaging / Studies: No results found.  Medications / Allergies: per chart  Antibiotics: Anti-infectives (From admission, onward)   Start     Dose/Rate Route Frequency Ordered Stop   12/12/16 0715  clindamycin (CLEOCIN) IVPB 900 mg     900 mg 100 mL/hr over 30 Minutes Intravenous On call to O.R.  12/12/16 5449 12/13/16 0559   12/12/16 0715  gentamicin (GARAMYCIN) 370 mg in dextrose 5 % 100 mL IVPB     5 mg/kg  74.1 kg (Adjusted) 109.3 mL/hr over 60 Minutes Intravenous On call to O.R. 12/12/16 0712 12/13/16 0559   12/10/16 1300  ciprofloxacin (CIPRO) IVPB 400 mg     400 mg 200 mL/hr over 60 Minutes Intravenous 2 times daily 12/10/16 1217          Note: Portions of this report may have been transcribed using voice recognition software. Every effort was made to ensure accuracy; however, inadvertent computerized transcription errors may be present.   Any transcriptional errors that result from this process are unintentional.     [name], M.D., F.A.C.S. Gastrointestinal and Minimally Invasive Surgery [facility] [address], [place], [place] [address] [phone] Main / Paging   12/13/2016

## 2016-12-13 NOTE — Progress Notes (Signed)
[facility] PROGRESS NOTE  Andrea Blackwell BSJ:[id number] DOB: 10-29-1955 DOA: 12/09/2016  PCP: Patient, No Pcp Per  Brief History/Interval Summary: 61 year old Caucasian female with a past medical history of hypothyroidism presented with complains of feeling sick and nauseated for the past couple of weeks with extremely poor appetite.  Has lost 15 pounds of the last 2 weeks.  Then she vomited blood the day prior to admission.  So she came into the emergency department.  Patient was found to have acute cholecystitis and possible choledocholithiasis.  She was hospitalized for further management.  Patient was seen by gastroenterology and surgery.  She underwent ERCP with stone removal.  Plan is for cholecystectomy.  Reason for Visit: Acute cholecystitis  Consultants: Gastroenterology.  General surgery.  Procedures:  ERCP Impression:                - The major papilla appeared normal. - The common bile duct was moderately dilated, with a stone causing an obstruction. - Choledocholithiasis was found. Complete removal  was accomplished by biliary sphincterotomy and balloon extraction. - A biliary sphincterotomy was performed. - The biliary tree was swept.   Antibiotics: Ciprofloxacin  Subjective/Interval History: Patient feels better this morning.  States that her abdominal pain has significantly improved.  She denies any nausea or vomiting.    ROS: Denies any headaches.  Objective:  Vital Signs  Vitals:   12/12/16 0548 12/12/16 1618 12/12/16 2026 12/13/16 0540  BP: (!) 131/94 (!) 156/76 132/69 (!) 142/60  Pulse: 64 62 61 67  Resp: 18 17 16 18   Temp: 98.1 F (36.7 C) 98.6 F (37 C) 98.5 F (36.9 C) 98.5 F (36.9 C)  TempSrc: Oral Oral Oral Oral  SpO2: 97% 97% 94% 93%  Weight:    92.2 kg (203 lb 4.2 oz)  Height:        Intake/Output Summary (Last 24 hours) at 12/13/2016 0845 Last data filed at 12/13/2016 0600 Gross per 24 hour  Intake 1237.5 ml  Output 5300 ml  Net  -4062.5 ml   Filed Weights   12/11/16 0338 12/12/16 0500 12/13/16 0540  Weight: 94.5 kg (208 lb 5.4 oz) 94.4 kg (208 lb 1.8 oz) 92.2 kg (203 lb 4.2 oz)    General appearance: Awake alert.  In no distress Resp: Mildly diminished air entry at the bases.  Mostly clear to auscultation bilaterally. Cardio: S1-S2 is normal regular.  No S3-S4.  No rubs murmurs or bruit GI: Abdomen is obese.  Soft.  Mildly tender in the epigastric area.  Much better compared to yesterday.  No masses or organomegaly.  Bowel sounds are present.   Neurologic: Awake alert.  No obvious focal neurological deficits.  Lab Results:  Data Reviewed: I have personally reviewed following labs and imaging studies  CBC: Recent Labs  Lab 12/09/16 2353 12/10/16 0501 12/10/16 0900 12/11/16 1011 12/12/16 0529  WBC 8.7 4.9 3.8* 4.3 4.0  HGB 14.4 12.2 12.2 12.0 11.3*  HCT 43.1 36.9 36.5 35.5* 34.5*  MCV 91.5 92.0 91.7 92.0 92.7  PLT 280 223 221 210 765    Basic Metabolic Panel: Recent Labs  Lab 12/09/16 2353 12/10/16 0900 12/11/16 1011 12/12/16 0529 12/13/16 0532  NA 136 140 137 141 140  K 4.1 3.8 4.3 4.2 4.2  CL 103 107 106 108 101  CO2 23 24 22 25 28   GLUCOSE 147* 94 129* 87 111*  BUN 12 9 10 9 6   CREATININE 0.36* 0.40* 0.53 0.56 0.63  CALCIUM 9.6 8.7*  8.6* 8.7* 9.3    GFR: Estimated Creatinine Clearance: 87.5 mL/min (by C-G formula based on SCr of 0.63 mg/dL).  Liver Function Tests: Recent Labs  Lab 12/09/16 2353 12/10/16 0900 12/11/16 1011 12/12/16 0529 12/13/16 0532  AST 228* 175* 162* 184* 295*  ALT 505* 384* 334* 354* 505*  ALKPHOS 281* 228* 213* 225* 269*  BILITOT 13.6* 9.5* 9.1* 8.5* 10.1*  PROT 7.3 5.9* 5.6* 5.7* 6.6  ALBUMIN 3.6 2.8* 2.7* 2.8* 3.2*    Recent Labs  Lab 12/10/16 0125 12/10/16 0501 12/11/16 0632 12/13/16 0532  LIPASE 2,778* 1,764* 68* 43   Cardiac Enzymes: Recent Labs  Lab 12/10/16 0001  TROPONINI <0.03    CBG: Recent Labs  Lab 12/10/16 0909  GLUCAP  87      Radiology Studies: No results found.   Medications:  Scheduled: . acetaminophen  1,000 mg Oral On Call to OR  . celecoxib  400 mg Oral On Call to OR  . Chlorhexidine Gluconate Cloth  6 each Topical Once  . Chlorhexidine Gluconate Cloth  6 each Topical Q0600  . dexamethasone  4 mg Intravenous On Call to OR  . escitalopram  10 mg Oral QHS  . gabapentin  300 mg Oral On Call to OR  . Influenza vac split quadrivalent PF  0.5 mL Intramuscular Tomorrow-1000  . levothyroxine  75 mcg Oral QAC breakfast  . mupirocin ointment  1 application Nasal BID  . pantoprazole (PROTONIX) IV  40 mg Intravenous Q12H   Continuous: . ciprofloxacin Stopped (12/12/16 2235)   JKK:XFGHWEXH injection, ondansetron (ZOFRAN) IV, phenol  Assessment/Plan:  Active Problems:   Gallstone pancreatitis   Pancreatitis    Acute gallstone pancreatitis Patient is improving.  Lipase level has significantly improved.  Abdominal pain is much less compared to before.  Continue IV fluids.  Triglyceride level 209.  Acute cholecystitis with choledocholithiasis Patient seen by gastroenterology.  Patient is noted to have significant elevation in bilirubin and alkaline phosphatase.  Patient underwent ERCP with removal of stone and sphincterotomy.  LFTs are stable.  Continue ciprofloxacin.  Plan is for cholecystectomy during this hospitalization.  General surgery is following.  Hematemesis Patient apparently vomited blood before she presented to the hospital.  No further episodes noted in the hospital.  Hemoglobin is stable.  She is not on anticoagulation at home.  She denies using NSAIDs.  PT and INR normal.  Continue PPI.  Hyperlipidemia Lipid panel noted.  Will need outpatient monitoring and management.  Calcified mass in the pelvis Noted incidentally on CT scan.  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  Outpatient management.  History of anxiety  disorder Continue Lexapro.  History of hypothyroidism Continue levothyroxine.  TSH is normal.  DVT Prophylaxis: SCDs for now    Code Status: Full code Family Communication: Discussed with the patient Disposition Plan: Management as outlined above.  Further plan per general surgery.    LOS: 3 days   [facility] Pager [phone] 12/13/2016, 8:45 AM  If 7PM-7AM, please contact night-coverage at [email or web address], password [facility]

## 2016-12-13 NOTE — Op Note (Addendum)
12/13/2016  PATIENT:  Andrea Blackwell  61 y.o. female  Patient Care Team: Patient, No Pcp Per as PCP - General (General Practice)  PRE-OPERATIVE DIAGNOSIS:    Acute on Chronic Calculus Cholecystitis  Gallstone pancreatitis  POST-OPERATIVE DIAGNOSIS:   Acute on Chronic Calculus Cholecystitis Gallstone pancreatitis Pinhole common bile duct leak. Liver: Probable cholestasis.  Possible steatohepatitis  PROCEDURE:    Laparoscopic cholecystectomy with IOC Partial CBD primary repair Core Liver Biopsy  SURGEON:  [name], MD, FACS.  ASSISTANT: OR Staff   ANESTHESIA:    General with endotracheal intubation Local anesthetic as a field block  EBL:  (See Anesthesia Intraoperative Record) No intake/output data recorded.  Delay start of Pharmacological VTE agent (>24hrs) due to surgical blood loss or risk of bleeding:  no  DRAINS: 19 Fr Blake closed bulb drain in the RUQ   SPECIMEN: Gallbladder & Core liver biopsies    DISPOSITION OF SPECIMEN:  PATHOLOGY  COUNTS:  YES  PLAN OF CARE: Admit to inpatient   PATIENT DISPOSITION:  PACU - hemodynamically stable.  INDICATION: 61 year old female coming in with severe jaundice and found to have dilated common bile duct.  Underwent ERCP with evacuation of common bile duct stones.  Pancreatitis has improved.  However liver function tests worsen.  Patient ready for surgery.  I recommended lap scopic cholecystectomy and probable repeat cholangiogram.  The anatomy & physiology of hepatobiliary & pancreatic function was discussed.  The pathophysiology of gallbladder dysfunction was discussed.  Natural history risks without surgery was discussed.   I feel the risks of no intervention will lead to serious problems that outweigh the operative risks; therefore, I recommended cholecystectomy to remove the pathology.  I explained laparoscopic techniques with possible need for an open approach.  Probable cholangiogram to evaluate the bilary tract was  explained as well.    Risks such as bleeding, infection, abscess, leak, injury to other organs, need for further treatment, heart attack, death, and other risks were discussed.  I noted a good likelihood this will help address the problem.  Possibility that this will not correct all abdominal symptoms was explained.  Goals of post-operative recovery were discussed as well.  We will work to minimize complications.  An educational handout further explaining the pathology and treatment options was given as well.  Questions were answered.  The patient expresses understanding & wishes to proceed with surgery.  OR FINDINGS: Acute on chronic changes in the gallbladder with thickening.  Very poor tissue and ischemic.  Significant inflammation at infundibulum and short cystic duct.  Pinhole leaking on common bile duct about 15 mm distance to the cystic/common bile duct junction.  Only noted with high pressure with cholangiocatheter.  No definite low-pressure leak.  Purplish liver most likely consistent with cholestasis.  Possible hematocrit pneumatosis.  Possible steatohepatitis.  Biopsies done.  Not consistent with macrocirrhosis.    DESCRIPTION:   The patient was identified & brought in the operating room. The patient was positioned supine with arms tucked. SCDs were active during the entire case. The patient underwent general anesthesia without any difficulty.  The abdomen was prepped and draped in a sterile fashion. A Surgical Timeout confirmed our plan.  I made a transverse curvilinear incision through the superior umbilical fold.  I placed a 79mm long port through the supraumbilical fascia using a modified Hassan cutdown technique with umbilical stalk fascial countertraction. I began carbon dioxide insufflation.  No change in end tidal CO2 measurement.   Camera inspection revealed no injury.  There were no adhesions to the anterior abdominal wall supraumbilically.  I proceeded to continue with single  site technique. I placed a #5 port in left upper aspect of the wound. I placed a 5 mm atraumatic grasper in the right inferior aspect of the wound.  I turned attention to the right upper quadrant.  There were moderate omental adhesions and a contracted thickened gallbladder with inflammation and some ischemia.  The gallbladder fundus was elevated cephalad. I freed adhesions to the ventral surface of the gallbladder off carefully.  I freed the peritoneal coverings between the gallbladder and the liver on the posteriolateral and anteriomedial walls. I alternated between Harmonic & blunt Maryland dissection to help get a good critical view of the cystic artery and cystic duct. I did further dissection to free the inferior two thirds of the gallbladder off the liver bed to get a good critical view of the infundibulum and cystic duct. I dissected out the cystic artery; and, after getting a good 360 view, ligated the anterior & posterior branches of the cystic artery close on the infundibulum using the Harmonic ultrasonic dissection.  I skeletonized the cystic duct.  It was about 2 cm.  I placed a clip on the infundibulum. I did a partial cystic duct-otomy and ensured patency. I placed a 5 [place] cholangiocatheter through a puncture site at the right subcostal ridge of the abdominal wall and directed it into the cystic duct.  I immediately noticed a we ran a cholangiogram with dilute radio-opaque contrast and continuous fluoroscopy..  There was leaking of contrast.  Reexamined.  Clips seen intact on the cystic duct.  I flushed with saline.  Could note a tiny 2 mm pinhole opening on the very dilated common bile duct.  Seemed about 15 mm inferior/distal to the cystic/common bile duct junction.  I was not really at that location.  I have freed off the remaining few attachments of the gallbladder off the liver.  I reinspected the anatomy to confirm the cystic/common bile duct junction and location of the pinhole leak  with high pressure saline flushing only.  I placed an additional 5 mm port in the right side.  I had laparoscopically used interrupted 2-0 PDS suture to try and help close this area down.  I initially had still some persistent leaking.  However with the third interrupted stitch it seemed to go down.  However there was leaking at the suture sites.  Her tissues were very poor.  I decided to abandon trying to do a more aggressive common bile duct repair.  Felt like I could not get an accurate cholangiogram at this point.  I ligated the cystic duct with 0 PDS Endoloop x2.  Tissues were too thickened to hold clips.  Her liver was dark purplish.  Concerning for cholestasis versus hemochromatosis versus fatty change.  Did not seem like obvious cirrhosis but obviously abnormal.  Given the significantly elevated liver function tests despite ERCP and with no frank bile extravasation, I decided to do core liver biopsies for completeness sake.  Used a 14-gauge core biopsy needle through the right subcostal puncture site.  Did 4 passes in segment 4 of the liver.  Got to excellent and to partial course.  I assured hemostasis on the liver surface.  I ensured hemostasis on the gallbladder fossa of the liver and elsewhere. I inspected the rest of the abdomen & detected no injury nor bleeding elsewhere.  Placed the gallbladder inside the [place].  I placed a drain  as noted above.  Secured with 2-0 Prolene.  It came out the right side for the extra 5 mm port was.  I brought omentum up to help pack the gallbladder fossa of the liver and [place] and hold the drain in place.  I removed the gallbladder out the supraumbilical fascia.  Had opened up the fascia 2.5 cm to get the thickened gallbladder out.  1 cm stones felt.  I closed the fascia transversely using #1 PDS interrupted stitches. I closed the skin using 4-0 monocryl stitch.  Sterile dressing was applied. The patient was extubated & arrived in the PACU in stable condition..  I  called gastroenterology, Dr. Fuller Plan, on-call for [facility] gastroenterology.  I asked that patient get an ERCP again with stenting.  Hopefully with internal/external drainage the common bile duct will heal down.  I think that would be the safest  aggressive plan since tissues were poor and tore with interrupted sutures.   I had discussed postoperative care with the patient earlier today.  I made an attempt to locate family to discuss patient's status and recommendations.  No one is available at this time.  I will try again later    [name], M.D., F.A.C.S. Gastrointestinal and Minimally Invasive Surgery [facility] [address] [place],  [address] [phone] Main / Paging  12/13/2016 7:42 PM

## 2016-12-13 NOTE — Anesthesia Procedure Notes (Signed)
Date/Time: 12/13/2016 7:24 PM Performed by: Cynda Familia, CRNA Pre-anesthesia Checklist: Emergency Drugs available, Suction available and Patient being monitored Oxygen Delivery Method: Simple face mask Placement Confirmation: positive ETCO2 and breath sounds checked- equal and bilateral Dental Injury: Teeth and Oropharynx as per pre-operative assessment  Comments: Extubated to simple face mask

## 2016-12-13 NOTE — Anesthesia Postprocedure Evaluation (Signed)
Anesthesia Post Note  Patient: Andrea Blackwell  Procedure(s) Performed: ENDOSCOPIC RETROGRADE CHOLANGIOPANCREATOGRAPHY (ERCP) (N/A )     Patient location during evaluation: PACU Anesthesia Type: General Level of consciousness: awake, awake and alert and oriented Pain management: pain level controlled Vital Signs Assessment: post-procedure vital signs reviewed and stable Respiratory status: spontaneous breathing, respiratory function stable and nonlabored ventilation Cardiovascular status: blood pressure returned to baseline Anesthetic complications: no    Last Vitals:  Vitals:   12/12/16 1618 12/12/16 2026  BP: (!) 156/76 132/69  Pulse: 62 61  Resp: 17 16  Temp: 37 C 36.9 C  SpO2: 97% 94%    Last Pain:  Vitals:   12/12/16 2026  TempSrc: Oral  PainSc:                  Rockney Grenz COKER

## 2016-12-13 NOTE — Transfer of Care (Signed)
Immediate Anesthesia Transfer of Care Note  Patient: Andrea Blackwell  Procedure(s) Performed: LAPAROSCOPIC CHOLECYSTECTOMY SINGLE SITE AND LIVER BIOPSY (N/A )  Patient Location: PACU  Anesthesia Type:General  Level of Consciousness: sedated  Airway & Oxygen Therapy: Patient Spontanous Breathing and Patient connected to face mask oxygen  Post-op Assessment: Report given to RN and Post -op Vital signs reviewed and stable  Post vital signs: Reviewed and stable  Last Vitals:  Vitals:   12/13/16 0540 12/13/16 1451  BP: (!) 142/60 (!) 135/58  Pulse: 67 (!) 51  Resp: 18 18  Temp: 36.9 C 36.9 C  SpO2: 93% 95%    Last Pain:  Vitals:   12/13/16 1451  TempSrc: Oral  PainSc:       Patients Stated Pain Goal: 3 (35/36/14 4315)  Complications: No apparent anesthesia complications

## 2016-12-13 NOTE — Anesthesia Preprocedure Evaluation (Signed)
Anesthesia Evaluation  Patient identified by MRN, date of birth, ID band Patient awake    Reviewed: Allergy & Precautions, NPO status , Patient's Chart, lab work & pertinent test results  Airway Mallampati: II  TM Distance: >3 FB Neck ROM: Full    Dental  (+) Teeth Intact, Dental Advisory Given   Pulmonary neg pulmonary ROS,    breath sounds clear to auscultation       Cardiovascular negative cardio ROS   Rhythm:Regular Rate:Normal     Neuro/Psych negative neurological ROS  negative psych ROS   GI/Hepatic negative GI ROS, Neg liver ROS,   Endo/Other  negative endocrine ROSHypothyroidism   Renal/GU negative Renal ROS     Musculoskeletal negative musculoskeletal ROS (+)   Abdominal   Peds  Hematology negative hematology ROS (+)   Anesthesia Other Findings   Reproductive/Obstetrics negative OB ROS                             Anesthesia Physical  Anesthesia Plan  ASA: III  Anesthesia Plan: General   Post-op Pain Management:    Induction: Intravenous  PONV Risk Score and Plan: 3 and Ondansetron and Dexamethasone  Airway Management Planned: Oral ETT  Additional Equipment:   Intra-op Plan:   Post-operative Plan: Extubation in OR  Informed Consent: I have reviewed the patients History and Physical, chart, labs and discussed the procedure including the risks, benefits and alternatives for the proposed anesthesia with the patient or authorized representative who has indicated his/her understanding and acceptance.   Dental advisory given  Plan Discussed with: CRNA and Anesthesiologist  Anesthesia Plan Comments:         Anesthesia Quick Evaluation

## 2016-12-13 NOTE — Anesthesia Postprocedure Evaluation (Signed)
Anesthesia Post Note  Patient: Andrea Blackwell  Procedure(s) Performed: LAPAROSCOPIC CHOLECYSTECTOMY SINGLE SITE AND LIVER BIOPSY (N/A )     Patient location during evaluation: PACU Anesthesia Type: General Level of consciousness: sedated and patient cooperative Pain management: pain level controlled Vital Signs Assessment: post-procedure vital signs reviewed and stable Respiratory status: spontaneous breathing Cardiovascular status: stable Anesthetic complications: no    Last Vitals:  Vitals:   12/13/16 2015 12/13/16 2030  BP: (!) 153/62 (!) 157/68  Pulse: 78 72  Resp: 10 16  Temp: 36.5 C 36.8 C  SpO2: 95% 96%    Last Pain:  Vitals:   12/13/16 2015  TempSrc:   PainSc: 2                  Nolon Nations

## 2016-12-13 NOTE — Progress Notes (Addendum)
Andrea Blackwell  [address] [place], [place] [address] Phone: [phone]  FAX: [phone]      [name] [id number] August 11, 1955  CARE TEAM:  PCP: Patient, No Pcp Per  Outpatient Care Team: Patient Care Team: Patient, No Pcp Per as PCP - General (General Practice)  Inpatient Treatment Team: Treatment Team: Attending Provider: Bonnielee Haff, MD; Rounding Team: Ian Bushman, MD; Consulting Physician: Edison Pace, Md, MD; Technician: Darrick Grinder, NT; Technician: Lucila Maine, NT; Consulting Physician: Carol Ada, MD; Registered Nurse: Gertie Fey, RN; Technician: Demaris Callander, NT; Registered Nurse: Tacey Ruiz, RN; Case Manager: Dessa Phi, RN   Problem List:   Active Problems:   Gallstone pancreatitis   Pancreatitis   3 Days Post-Op  12/10/2016  Procedure(s): ENDOSCOPIC RETROGRADE CHOLANGIOPANCREATOGRAPHY (ERCP)    Assessment/Plan Acute gallstone pancreatitis 2/2 choledocholithiasis   S/p ERCP w/ sphincterotomy 11/3 Dr. Benson Norway   While feeling better, her liver function tests remain elevated.  Perhaps related to edema at the ampulla.  Proceed with cholecystectomy and cholangiogram today.  Probably for intraoperative core liver biopsies as well.  She is ready to proceed.   The anatomy & physiology of hepatobiliary & pancreatic function was discussed.  The pathophysiology of gallbladder dysfunction was discussed.  Natural history risks without surgery was discussed.   I feel the risks of no intervention will lead to serious problems that outweigh the operative risks; therefore, I recommended cholecystectomy to remove the pathology.  I explained laparoscopic techniques with possible need for an open approach.  Probable cholangiogram to evaluate the bilary tract was explained as well.    Risks such as bleeding, infection, abscess, leak, injury to other organs, need for repair of tissues / organs, need for  further treatment, stroke, heart attack, death, and other risks were discussed.  I noted a good likelihood this will help address the problem.  Possibility that this will not correct all abdominal symptoms was explained.  Goals of post-operative recovery were discussed as well.  We will work to minimize complications.  An educational handout further explaining the pathology and treatment options was given as well.  Questions were answered.  The patient expresses understanding & wishes to proceed with surgery.   Heartburn/GERD: Switch Protonix to p.o. FEN: NPO, IVF  ID: ciprofloxacin  VTE: SCD's Jaundice: Hopefully will resolve.  If not improved or concerns interoperatively, reconsult gastroenterology.   30 minutes spent in review, evaluation, examination, counseling, and coordination of care.  More than 50% of that time was spent in counseling.  [name], M.D., F.A.C.S. Gastrointestinal and Minimally Invasive Surgery [facility] [address] [place], [place] [address] [phone] Main / Paging   12/13/2016    Subjective: (Chief complaint)  Less sore.  Wishes to proceed with surgery.  Objective:  Vital signs:  Vitals:   12/12/16 0548 12/12/16 1618 12/12/16 2026 12/13/16 0540  BP: (!) 131/94 (!) 156/76 132/69 (!) 142/60  Pulse: 64 62 61 67  Resp: '18 17 16 18  ' Temp: 98.1 F (36.7 C) 98.6 F (37 C) 98.5 F (36.9 C) 98.5 F (36.9 C)  TempSrc: Oral Oral Oral Oral  SpO2: 97% 97% 94% 93%  Weight:    92.2 kg (203 lb 4.2 oz)  Height:        Last BM Date: 12/11/16  Intake/Output   Yesterday:  11/05 0701 - 11/06 0700 In: 1237.5 [P.O.:60; I.V.:777.5; IV Piggyback:400] Out: 5300 [Urine:5300] This shift:  Total I/O In: -  Out: 300 [Urine:300]  Bowel function:  Flatus: YES  BM:  YES  Drain: (No drain)   Physical Exam:  General: Pt awake/alert/oriented x4 in no acute distress Eyes: PERRL, normal EOM.  Sclera clear.  +  icterus Neuro: CN II-XII intact w/o focal sensory/motor deficits. Lymph: No head/neck/groin lymphadenopathy Psych:  No delerium/psychosis/paranoia HENT: Normocephalic, Mucus membranes moist.  No thrush Neck: Supple, No tracheal deviation Chest: No chest wall pain w good excursion CV:  Pulses intact.  Regular rhythm MS: Normal AROM mjr joints.  No obvious deformity  Abdomen: Soft.  Mildy distended.  Tenderness at upeer abdomen only.  No evidence of peritonitis.  No incarcerated hernias.  Ext:   No deformity.  No mjr edema.  No cyanosis Skin: No petechiae / purpura.  Very jaundiced  Results:   Labs: Results for orders placed or performed during the hospital encounter of 12/09/16 (from the past 48 hour(s))  CBC     Status: Abnormal   Collection Time: 12/12/16  5:29 AM  Result Value Ref Range   WBC 4.0 4.0 - 10.5 K/uL   RBC 3.72 (L) 3.87 - 5.11 MIL/uL   Hemoglobin 11.3 (L) 12.0 - 15.0 g/dL   HCT 34.5 (L) 36.0 - 46.0 %   MCV 92.7 78.0 - 100.0 fL   MCH 30.4 26.0 - 34.0 pg   MCHC 32.8 30.0 - 36.0 g/dL   RDW 14.7 11.5 - 15.5 %   Platelets 193 150 - 400 K/uL  Comprehensive metabolic panel     Status: Abnormal   Collection Time: 12/12/16  5:29 AM  Result Value Ref Range   Sodium 141 135 - 145 mmol/L   Potassium 4.2 3.5 - 5.1 mmol/L   Chloride 108 101 - 111 mmol/L   CO2 25 22 - 32 mmol/L   Glucose, Bld 87 65 - 99 mg/dL   BUN 9 6 - 20 mg/dL   Creatinine, Ser 0.56 0.44 - 1.00 mg/dL   Calcium 8.7 (L) 8.9 - 10.3 mg/dL   Total Protein 5.7 (L) 6.5 - 8.1 g/dL   Albumin 2.8 (L) 3.5 - 5.0 g/dL   AST 184 (H) 15 - 41 U/L   ALT 354 (H) 14 - 54 U/L   Alkaline Phosphatase 225 (H) 38 - 126 U/L   Total Bilirubin 8.5 (H) 0.3 - 1.2 mg/dL   GFR calc non Af Amer >60 >60 mL/min   GFR calc Af Amer >60 >60 mL/min    Comment: (NOTE) The eGFR has been calculated using the CKD EPI equation. This calculation has not been validated in all clinical situations. eGFR's persistently <60 mL/min signify  possible Chronic Kidney Disease.    Anion gap 8 5 - 15  Surgical pcr screen     Status: Abnormal   Collection Time: 12/12/16 10:32 AM  Result Value Ref Range   MRSA, PCR POSITIVE (A) NEGATIVE    Comment: RESULT CALLED TO, READ BACK BY AND VERIFIED WITH: ORAEGBUNAN,I. RN '@[id number]'  ON 11.05.18 BY COHEN,K    Staphylococcus aureus POSITIVE (A) NEGATIVE    Comment: (NOTE) The Xpert SA Assay (FDA approved for NASAL specimens in patients 54 years of age and older), is one component of a comprehensive surveillance program. It is not intended to diagnose infection nor to guide or monitor treatment.   Comprehensive metabolic panel     Status: Abnormal   Collection Time: 12/13/16  5:32 AM  Result Value Ref Range   Sodium 140 135 - 145  mmol/L   Potassium 4.2 3.5 - 5.1 mmol/L   Chloride 101 101 - 111 mmol/L   CO2 28 22 - 32 mmol/L   Glucose, Bld 111 (H) 65 - 99 mg/dL   BUN 6 6 - 20 mg/dL   Creatinine, Ser 0.63 0.44 - 1.00 mg/dL   Calcium 9.3 8.9 - 10.3 mg/dL   Total Protein 6.6 6.5 - 8.1 g/dL   Albumin 3.2 (L) 3.5 - 5.0 g/dL   AST 295 (H) 15 - 41 U/L   ALT 505 (H) 14 - 54 U/L   Alkaline Phosphatase 269 (H) 38 - 126 U/L   Total Bilirubin 10.1 (H) 0.3 - 1.2 mg/dL   GFR calc non Af Amer >60 >60 mL/min   GFR calc Af Amer >60 >60 mL/min    Comment: (NOTE) The eGFR has been calculated using the CKD EPI equation. This calculation has not been validated in all clinical situations. eGFR's persistently <60 mL/min signify possible Chronic Kidney Disease.    Anion gap 11 5 - 15  Lipase, blood     Status: None   Collection Time: 12/13/16  5:32 AM  Result Value Ref Range   Lipase 43 11 - 51 U/L    Imaging / Studies: No results found.  Medications / Allergies: per chart  Antibiotics: Anti-infectives (From admission, onward)   Start     Dose/Rate Route Frequency Ordered Stop   12/12/16 0715  clindamycin (CLEOCIN) IVPB 900 mg     900 mg 100 mL/hr over 30 Minutes Intravenous On call to O.R.  12/12/16 1610 12/13/16 0559   12/12/16 0715  gentamicin (GARAMYCIN) 370 mg in dextrose 5 % 100 mL IVPB     5 mg/kg  74.1 kg (Adjusted) 109.3 mL/hr over 60 Minutes Intravenous On call to O.R. 12/12/16 0712 12/13/16 0559   12/10/16 1300  ciprofloxacin (CIPRO) IVPB 400 mg     400 mg 200 mL/hr over 60 Minutes Intravenous 2 times daily 12/10/16 1217          Note: Portions of this report may have been transcribed using voice recognition software. Every effort was made to ensure accuracy; however, inadvertent computerized transcription errors may be present.   Any transcriptional errors that result from this process are unintentional.     [name], M.D., F.A.C.S. Gastrointestinal and Minimally Invasive Surgery [facility] [address] [place], [place] [address] [phone] Main / Paging   12/13/2016

## 2016-12-13 NOTE — Anesthesia Procedure Notes (Signed)
Procedure Name: Intubation Date/Time: 12/13/2016 4:59 PM Performed by: Sharlette Dense, CRNA Patient Re-evaluated:Patient Re-evaluated prior to induction Oxygen Delivery Method: Circle system utilized Preoxygenation: Pre-oxygenation with 100% oxygen Induction Type: IV induction Ventilation: Mask ventilation without difficulty and Oral airway inserted - appropriate to patient size Laryngoscope Size: Sabra Heck and 2 Grade View: Grade I Tube type: Oral Tube size: 7.5 mm Number of attempts: 1 Airway Equipment and Method: Stylet Placement Confirmation: ETT inserted through vocal cords under direct vision,  positive ETCO2 and breath sounds checked- equal and bilateral Secured at: 21 cm Tube secured with: Tape Dental Injury: Teeth and Oropharynx as per pre-operative assessment

## 2016-12-13 NOTE — Interval H&P Note (Signed)
History and Physical Interval Note:  12/13/2016 4:43 PM  Andrea Blackwell  has presented today for surgery, with the diagnosis of ACUTE CHOLECYSTITIS  The various methods of treatment have been discussed with the patient and family. After consideration of risks, benefits and other options for treatment, the patient has consented to  Procedure(s): [place] CHOLANGIOGRAM (N/A) as a surgical intervention .  The patient's history has been reviewed, patient examined, no change in status, stable for surgery.  I have reviewed the patient's chart and labs.  Questions were answered to the patient's satisfaction.     Corvette Orser C.

## 2016-12-13 NOTE — Discharge Instructions (Signed)
DRAIN CARE:   You have a closed bulb drain to help you heal.    A bulb drain is a small, plastic reservoir which creates a gentle suction. It is used to remove excess fluid from a surgical wound. The color and amount of fluid will vary. Immediately after surgery, the fluid is bright red. It may gradually change to a yellow color. When the amount decreases to about 1 or 2 tablespoons (15 to 30 cc) per 24 hours, your caregiver will usually remove it.  JP Care  The Jackson-Pratt drainage system has flexible tubing attached to a soft, plastic bulb with a stopper. The drainage end of the tubing, which is flat and white, goes into your body through a small opening near your incision (surgical cut). A stitch holds the drainage end in place. The rest of the tube is outside your body, attached to the bulb. When the bulb is compressed with the stopper in place, it creates a vacuum. This causes a constant gentle suction, which helps draw out fluid that collects under your incision. The bulb should be compressed at all times, except when you are emptying the drainage.  How long you will have your Jackson-Pratt depends on your surgery and the amount of fluid is draining. This is different for everyone. The Jackson-Pratt is usually removed when the drainage is 30 mL or less over 24 hours. To keep track of how much drainage youre having, you will record the amount in a drainage log. Its important to bring the log with you to your follow-up appointments.  Caring for Your Jackson-Pratt at Home In order to care for your Jackson-Pratt at home, you or your caregiver will do the following:  Empty the drain once a day and record the color and amount of drainage  Care for the area where the tubing enters your skin by washing with soap and water.  Milk the tubing to help move clots into the bulb.  Do this before you empty and measure your drainage. Look in the mirror at the tubing. This will help you see where your  hands need to be. Pinch the tubing close to where it goes into your skin between your thumb and forefinger. With the thumb and forefinger of your other hand, pinch the tubing right below your other fingers. Keep your fingers pinched and slide them down the tubing, pushing any clots down toward the bulb. You may want to use alcohol swabs to help you slide your fingers down the tubing. Repeat steps 3 and 4 as necessary to push clots from the tubing into the bulb. If you are not able to move a clot into the bulb, call your doctors office. The fluid may leak around the insertion site if a clot is blocking the drainage flow. If there is fluid in the bulb and no leakage at the insertion site, the drain is working.  How to Empty Your Jackson-Pratt and Record the Drainage You will need to empty your Jackson-Pratt every day  Gather the following supplies:  Measuring container your nurse gave you Jackson-Pratt Drainage Record  Pen or pencil  Instructions Clean an area to work on. Clean your hands thoroughly. Unplug the stopper on top of your Jackson-Pratt. This will cause the bulb to expand. Do not touch the inside of the stopper or the inner area of the opening on the bulb. Turn your Jackson-Pratt upside down, gently squeeze the bulb, and pour the drainage into the measuring container. Turn your Jackson-Pratt right  side up. Squeeze the bulb until your fingers feel the palm of your hand. Keep squeezing the bulb while you replug the stopper. Make sure the bulb stays fully compressed to ensure constant, gentle suction.    Check the amount and color of drainage in the measuring container. The first couple days after surgery the fluid may be dark red. This is normal. As you heal the fluid may look pink or pale yellow. Record this amount and the color of drainage on your Jackson-Pratt Drainage Record. Flush the drainage down the toilet and rinse the measuring container with water.  Caring for the  Insertion Site  Once you have emptied the drainage, clean your hands again. Check the area around the insertion site. Look for tenderness, swelling, or pus. If you have any of these, or if you have a temperature of 101 F (38.3 C) or higher, you may have an infection. Call your doctors office.  Sometimes, the drain causes redness the size of a dime at your insertion site. This is normal. Your healthcare provider will tell you if you should place a bandage over the insertion site.  Wash drain site with soap & water (dilute hydrogen peroxide PRN) daily & replace clean dressing / tape    DAILY CARE  Keep the bulb compressed at all times, except while emptying it. The compression creates suction.   Keep sites where the tubes enter the skin dry and covered with a light bandage (dressing).   Tape the tubes to your skin, 1 to 2 inches below the insertion sites, to keep from pulling on your stitches. Tubes are stitched in place and will not slip out.   Pin the bulb to your shirt (not to your pants) with a safety pin.   For the first few days after surgery, there usually is more fluid in the bulb. Empty the bulb whenever it becomes half full because the bulb does not create enough suction if it is too full. Include this amount in your 24 hour totals.   When the amount of drainage decreases, empty the bulb at the same time every day. Write down the amounts and the 24 hour totals. Your caregiver will want to know them. This helps your caregiver know when the tubes can be removed.   (We anticipate removing the drain in 1-3 weeks, depending on when the output is <12m a day for 2+ days)  If there is drainage around the tube sites, change dressings and keep the area dry. If you see a clot in the tube, leave it alone. However, if the tube does not appear to be draining, let your caregiver know.  TO EMPTY THE BULB  Open the stopper to release suction.   Holding the stopper out of the way, pour  drainage into the measuring cup that was sent home with you.   Measure and write down the amount. If there are 2 bulbs, note the amount of drainage from bulb 1 or bulb 2 and keep the totals separate. Your caregiver will want to know which tube is draining more.   Compress the bulb by folding it in half.   Replace the stopper.   Check the tape that holds the tube to your skin, and pin the bulb to your shirt.  SEEK MEDICAL CARE IF:  The drainage develops a bad odor.   You have an oral temperature above 102 F (38.9 C).   The amount of drainage from your wound suddenly increases or decreases.  You accidentally pull out your drain.   You have any other questions or concerns.  MAKE SURE YOU:   Understand these instructions.   Will watch your condition.   Will get help right away if you are not doing well or get worse.     Call our office if you have any questions about your drain. [phone]  LAPAROSCOPIC SURGERY: POST OP INSTRUCTIONS  ######################################################################  EAT Gradually transition to a high fiber diet with a fiber supplement over the next few weeks after discharge.  Start with a pureed / full liquid diet (see below)  WALK Walk an hour a day.  Control your pain to do that.    CONTROL PAIN Control pain so that you can walk, sleep, tolerate sneezing/coughing, go up/down stairs.  HAVE A BOWEL MOVEMENT DAILY Keep your bowels regular to avoid problems.  OK to try a laxative to override constipation.  OK to use an antidairrheal to slow down diarrhea.  Call if not better after 2 tries  CALL IF YOU HAVE PROBLEMS/CONCERNS Call if you are still struggling despite following these instructions. Call if you have concerns not answered by these instructions  ######################################################################    1. DIET: Follow a light bland diet the first 24 hours after arrival home, such as soup, liquids,  crackers, etc.  Be sure to include lots of fluids daily.  Avoid fast food or heavy meals as your are more likely to get nauseated.  Eat a low fat the next few days after surgery.   2. Take your usually prescribed home medications unless otherwise directed. 3. PAIN CONTROL: a. Pain is best controlled by a usual combination of three different methods TOGETHER: i. Ice/Heat ii. Over the counter pain medication iii. Prescription pain medication b. Most patients will experience some swelling and bruising around the incisions.  Ice packs or heating pads (30-60 minutes up to 6 times a day) will help. Use ice for the first few days to help decrease swelling and bruising, then switch to heat to help relax tight/sore spots and speed recovery.  Some people prefer to use ice alone, heat alone, alternating between ice & heat.  Experiment to what works for you.  Swelling and bruising can take several weeks to resolve.   c. It is helpful to take an over-the-counter pain medication regularly for the first few weeks.  Choose one of the following that works best for you: i. Naproxen (Aleve, etc)  Two 220mg  tabs twice a day ii. Ibuprofen (Advil, etc) Three 200mg  tabs four times a day (every meal & bedtime) iii. Acetaminophen (Tylenol, etc) 500-650mg  four times a day (every meal & bedtime) d. A  prescription for pain medication (such as oxycodone, hydrocodone, etc) should be given to you upon discharge.  Take your pain medication as prescribed.  i. If you are having problems/concerns with the prescription medicine (does not control pain, nausea, vomiting, rash, itching, etc), please call us [phone] to see if we need to switch you to a different pain medicine that will work better for you and/or control your side effect better. ii. If you need a refill on your pain medication, please contact your pharmacy.  They will contact our office to request authorization. Prescriptions will not be filled after 5 pm or on  week-ends. 4. Avoid getting constipated.  Between the surgery and the pain medications, it is common to experience some constipation.  Increasing fluid intake and taking a fiber supplement (such as Metamucil, Citrucel, FiberCon, MiraLax, etc)  1-2 times a day regularly will usually help prevent this problem from occurring.  A mild laxative (prune juice, Milk of Magnesia, MiraLax, etc) should be taken according to package directions if there are no bowel movements after 48 hours.   5. Watch out for diarrhea.  If you have many loose bowel movements, simplify your diet to bland foods & liquids for a few days.  Stop any stool softeners and decrease your fiber supplement.  Switching to mild anti-diarrheal medications (Kayopectate, Pepto Bismol) can help.  If this worsens or does not improve, please call us. 6. Wash / shower every day.  You may shower over the dressings as they are waterproof.  Continue to shower over incision(s) after the dressing is off. 7. Remove your waterproof bandages 5 days after surgery.  You may leave the incision open to air.  You may replace a dressing/Band-Aid to cover the incision for comfort if you wish.  8. ACTIVITIES as tolerated:   a. You may resume regular (light) daily activities beginning the next day--such as daily self-care, walking, climbing stairs--gradually increasing activities as tolerated.  If you can walk 30 minutes without difficulty, it is safe to try more intense activity such as jogging, treadmill, bicycling, low-impact aerobics, swimming, etc. b. Save the most intensive and strenuous activity for last such as sit-ups, heavy lifting, contact sports, etc  Refrain from any heavy lifting or straining until you are off narcotics for pain control.   c. DO NOT PUSH THROUGH PAIN.  Let pain be your guide: If it hurts to do something, don't do it.  Pain is your body warning you to avoid that activity for another week until the pain goes down. d. You may drive when you are  no longer taking prescription pain medication, you can comfortably wear a seatbelt, and you can safely maneuver your car and apply brakes. e. Dennis Bast may have sexual intercourse when it is comfortable.  9. FOLLOW UP in our office a. Please call [facility] at [phone] to set up an appointment to see your surgeon in the office for a follow-up appointment approximately 2-3 weeks after your surgery. b. Make sure that you call for this appointment the day you arrive home to insure a convenient appointment time. 10. IF YOU HAVE DISABILITY OR FAMILY LEAVE FORMS, BRING THEM TO THE OFFICE FOR PROCESSING.  DO NOT GIVE THEM TO YOUR DOCTOR.   WHEN TO CALL us [phone]: 1. Poor pain control 2. Reactions / problems with new medications (rash/itching, nausea, etc)  3. Fever over 101.5 F (38.5 C) 4. Inability to urinate 5. Nausea and/or vomiting 6. Worsening swelling or bruising 7. Continued bleeding from incision. 8. Increased pain, redness, or drainage from the incision   The clinic staff is available to answer your questions during regular business hours (8:30am-5pm).  Please dont hesitate to call and ask to speak to one of our nurses for clinical concerns.   If you have a medical emergency, go to the nearest emergency room or call 911.  A surgeon from [facility] is always on call at the [facility], [place], [place], [place], [place]  [address] ? MAIN: [phone] ? TOLL FREE: [phone] ?  FAX [phone] [email or web address]    Cholecystitis Cholecystitis is inflammation of the gallbladder. It is often called a gallbladder attack. The gallbladder is a pear-shaped organ that lies beneath the liver on the right side of the body. The gallbladder  stores bile, which is a fluid that helps the body to digest fats. If bile builds up in your gallbladder, your gallbladder becomes inflamed. This condition may occur suddenly (be  acute). Repeat episodes of acute cholecystitis or prolonged episodes may lead to a long-term (chronic) condition. Cholecystitis is serious and it requires treatment. What are the causes? The most common cause of this condition is gallstones. Gallstones can block the tube (duct) that carries bile out of your gallbladder. This causes bile to build up. Other causes of this condition include:  Damage to the gallbladder due to a decrease in blood flow.  Infections in the bile ducts.  Scars or kinks in the bile ducts.  Tumors in the liver, pancreas, or gallbladder.  What increases the risk? This condition is more likely to develop in:  People who have sickle cell disease.  People who take birth control pills or use estrogen.  People who have alcoholic liver disease.  People who have liver cirrhosis.  People who have their nutrition delivered through a vein (parenteral nutrition).  People who do not eat or drink (do fasting) for a long period of time.  People who are obese.  People who have rapid weight loss.  People who are pregnant.  People who have increased triglyceride levels.  People who have pancreatitis.  What are the signs or symptoms? Symptoms of this condition include:  Abdominal pain, especially in the upper right area of the abdomen.  Abdominal tenderness or bloating.  Nausea.  Vomiting.  Fever.  Chills.  Yellowing of the skin and the whites of the eyes (jaundice).  How is this diagnosed? This condition is diagnosed with a medical history and physical exam. You may also have other tests, including:  Imaging tests, such as: ? An ultrasound of the gallbladder. ? A CT scan of the abdomen. ? A gallbladder nuclear scan (HIDA scan). This scan allows your health care provider to see the bile moving from your liver to your gallbladder and to your small intestine. ? MRI.  Blood tests, such as: ? A complete blood count, because the white blood cell count  may be higher than normal. ? Liver function tests, because some levels may be higher than normal with certain types of gallstones.  How is this treated? Treatment may include:  Fasting for a certain amount of time.  IV fluids.  Medicine to treat pain or vomiting.  Antibiotic medicine.  Surgery to remove your gallbladder (cholecystectomy). This may happen immediately or at a later time.  Follow these instructions at home: Home care will depend on your treatment. In general:  Take over-the-counter and prescription medicines only as told by your health care provider.  If you were prescribed an antibiotic medicine, take it as told by your health care provider. Do not stop taking the antibiotic even if you start to feel better.  Follow instructions from your health care provider about what to eat or drink. When you are allowed to eat, avoid eating or drinking anything that triggers your symptoms.  Keep all follow-up visits as told by your health care provider. This is important.  Contact a health care provider if:  Your pain is not controlled with medicine.  You have a fever. Get help right away if:  Your pain moves to another part of your abdomen or to your back.  You continue to have symptoms or you develop new symptoms even with treatment. This information is not intended to replace advice given to you by  your health care provider. Make sure you discuss any questions you have with your health care provider. Document Released: 01/24/2005 Document Revised: 06/04/2015 Document Reviewed: 05/07/2014 Elsevier Interactive Patient Education  2017 [employer].

## 2016-12-14 ENCOUNTER — Encounter (HOSPITAL_COMMUNITY)
Admission: EM | Disposition: A | Discharge status: Home Health | Payer: Self-pay | Source: Home / Self Care | Attending: Internal Medicine

## 2016-12-14 ENCOUNTER — Inpatient Hospital Stay (HOSPITAL_COMMUNITY): Payer: Self-pay | Admitting: Anesthesiology

## 2016-12-14 ENCOUNTER — Encounter (HOSPITAL_COMMUNITY): Payer: Self-pay | Admitting: Surgery

## 2016-12-14 ENCOUNTER — Telehealth: Payer: Self-pay

## 2016-12-14 ENCOUNTER — Inpatient Hospital Stay (HOSPITAL_COMMUNITY): Payer: Self-pay

## 2016-12-14 ENCOUNTER — Other Ambulatory Visit: Payer: Self-pay

## 2016-12-14 DIAGNOSIS — K8000 Calculus of gallbladder with acute cholecystitis without obstruction: Secondary | ICD-10-CM

## 2016-12-14 DIAGNOSIS — K838 Other specified diseases of biliary tract: Secondary | ICD-10-CM

## 2016-12-14 DIAGNOSIS — K8500 Idiopathic acute pancreatitis without necrosis or infection: Secondary | ICD-10-CM

## 2016-12-14 HISTORY — PX: ERCP: SHX5425

## 2016-12-14 LAB — CBC
HCT: 38.4 % (ref 36.0–46.0)
Hemoglobin: 12.6 g/dL (ref 12.0–15.0)
MCH: 30.2 pg (ref 26.0–34.0)
MCHC: 32.8 g/dL (ref 30.0–36.0)
MCV: 92.1 fL (ref 78.0–100.0)
PLATELETS: 252 10*3/uL (ref 150–400)
RBC: 4.17 MIL/uL (ref 3.87–5.11)
RDW: 14 % (ref 11.5–15.5)
WBC: 6.5 10*3/uL (ref 4.0–10.5)

## 2016-12-14 LAB — COMPREHENSIVE METABOLIC PANEL
ALBUMIN: 2.7 g/dL — AB (ref 3.5–5.0)
ALK PHOS: 247 U/L — AB (ref 38–126)
ALT: 585 U/L — AB (ref 14–54)
AST: 408 U/L — ABNORMAL HIGH (ref 15–41)
Anion gap: 10 (ref 5–15)
BILIRUBIN TOTAL: 9.8 mg/dL — AB (ref 0.3–1.2)
BUN: 8 mg/dL (ref 6–20)
CALCIUM: 8.9 mg/dL (ref 8.9–10.3)
CO2: 26 mmol/L (ref 22–32)
CREATININE: 0.64 mg/dL (ref 0.44–1.00)
Chloride: 101 mmol/L (ref 101–111)
GFR calc Af Amer: >60 mL/min (ref >60)
GFR calc non Af Amer: >60 mL/min (ref >60)
GLUCOSE: 190 mg/dL — AB (ref 65–99)
Potassium: 4.2 mmol/L (ref 3.5–5.1)
SODIUM: 137 mmol/L (ref 135–145)
TOTAL PROTEIN: 6.2 g/dL — AB (ref 6.5–8.1)

## 2016-12-14 LAB — PROTIME-INR
INR: 1.05
Prothrombin Time: 13.6 seconds (ref 11.4–15.2)

## 2016-12-14 LAB — LIPASE, BLOOD: Lipase: 29 U/L (ref 11–51)

## 2016-12-14 SURGERY — ERCP, WITH INTERVENTION IF INDICATED
Anesthesia: General

## 2016-12-14 MED ORDER — PROPOFOL 10 MG/ML IV BOLUS
INTRAVENOUS | Status: DC | PRN
  Administered 2016-12-14: 20 mg via INTRAVENOUS
  Administered 2016-12-14: 180 mg via INTRAVENOUS

## 2016-12-14 MED ORDER — SUCCINYLCHOLINE CHLORIDE 200 MG/10ML IV SOSY
PREFILLED_SYRINGE | INTRAVENOUS | Status: DC | PRN
  Administered 2016-12-14: 120 mg via INTRAVENOUS

## 2016-12-14 MED ORDER — EPHEDRINE SULFATE 50 MG/ML IJ SOLN
INTRAMUSCULAR | Status: DC | PRN
  Administered 2016-12-14 (×2): 10 mg via INTRAVENOUS

## 2016-12-14 MED ORDER — FENTANYL CITRATE (PF) 100 MCG/2ML IJ SOLN
INTRAMUSCULAR | Status: DC | PRN
  Administered 2016-12-14 (×2): 50 ug via INTRAVENOUS

## 2016-12-14 MED ORDER — SODIUM CHLORIDE 0.9 % IV SOLN
INTRAVENOUS | Status: DC | PRN
  Administered 2016-12-14: 10 mL

## 2016-12-14 MED ORDER — LACTATED RINGERS IV SOLN
INTRAVENOUS | Status: DC
Start: 2016-12-14 — End: 2016-12-14
  Administered 2016-12-14: 1000 mL via INTRAVENOUS

## 2016-12-14 MED ORDER — MIDAZOLAM HCL 2 MG/2ML IJ SOLN
INTRAMUSCULAR | Status: AC
  Filled 2016-12-14: qty 2

## 2016-12-14 MED ORDER — ONDANSETRON HCL 4 MG/2ML IJ SOLN
INTRAMUSCULAR | Status: AC
  Filled 2016-12-14: qty 2

## 2016-12-14 MED ORDER — KETOROLAC TROMETHAMINE 15 MG/ML IJ SOLN
INTRAMUSCULAR | Status: DC | PRN
  Administered 2016-12-14: 15 mg via INTRAVENOUS

## 2016-12-14 MED ORDER — FENTANYL CITRATE (PF) 100 MCG/2ML IJ SOLN
INTRAMUSCULAR | Status: AC
  Filled 2016-12-14: qty 2

## 2016-12-14 MED ORDER — PROPOFOL 10 MG/ML IV BOLUS
INTRAVENOUS | Status: AC
  Filled 2016-12-14: qty 20

## 2016-12-14 MED ORDER — DEXAMETHASONE SODIUM PHOSPHATE 10 MG/ML IJ SOLN
INTRAMUSCULAR | Status: DC | PRN
  Administered 2016-12-14: 10 mg via INTRAVENOUS

## 2016-12-14 MED ORDER — LACTATED RINGERS IV SOLN
INTRAVENOUS | Status: DC | PRN
  Administered 2016-12-14: 13:00:00 via INTRAVENOUS

## 2016-12-14 MED ORDER — INDOMETHACIN 50 MG RE SUPP
RECTAL | Status: AC
  Filled 2016-12-14: qty 2

## 2016-12-14 MED ORDER — DEXAMETHASONE SODIUM PHOSPHATE 10 MG/ML IJ SOLN
INTRAMUSCULAR | Status: AC
  Filled 2016-12-14: qty 1

## 2016-12-14 MED ORDER — MIDAZOLAM HCL 5 MG/5ML IJ SOLN
INTRAMUSCULAR | Status: DC | PRN
  Administered 2016-12-14: 2 mg via INTRAVENOUS

## 2016-12-14 MED ORDER — LIDOCAINE 2% (20 MG/ML) 5 ML SYRINGE
INTRAMUSCULAR | Status: DC | PRN
  Administered 2016-12-14: 60 mg via INTRAVENOUS

## 2016-12-14 MED ORDER — INDOMETHACIN 50 MG RE SUPP
RECTAL | Status: DC | PRN
  Administered 2016-12-14: 100 mg via RECTAL

## 2016-12-14 MED ORDER — ONDANSETRON HCL 4 MG/2ML IJ SOLN
INTRAMUSCULAR | Status: DC | PRN
  Administered 2016-12-14: 4 mg via INTRAVENOUS

## 2016-12-14 MED ORDER — SODIUM CHLORIDE 0.9 % IV SOLN: INTRAVENOUS | Status: DC

## 2016-12-14 MED ORDER — LIDOCAINE 2% (20 MG/ML) 5 ML SYRINGE
INTRAMUSCULAR | Status: AC
  Filled 2016-12-14: qty 5

## 2016-12-14 NOTE — Transfer of Care (Signed)
Immediate Anesthesia Transfer of Care Note  Patient: Andrea Blackwell  Procedure(s) Performed: ENDOSCOPIC RETROGRADE CHOLANGIOPANCREATOGRAPHY (ERCP) (N/A )  Patient Location: PACU  Anesthesia Type:General  Level of Consciousness: awake, alert , oriented and patient cooperative  Airway & Oxygen Therapy: Patient Spontanous Breathing and Patient connected to nasal cannula oxygen  Post-op Assessment: Report given to RN, Post -op Vital signs reviewed and stable and Patient moving all extremities X 4  Post vital signs: Reviewed and stable  Last Vitals:  Vitals:   12/14/16 1300 12/14/16 1435  BP: (!) 150/48 (!) 117/48  Pulse: 68 82  Resp: 15 14  Temp: 37.1 C 36.9 C  SpO2: 96% 99%    Last Pain:  Vitals:   12/14/16 1435  TempSrc: Oral  PainSc:       Patients Stated Pain Goal: 3 (94/17/40 8144)  Complications: No apparent anesthesia complications

## 2016-12-14 NOTE — Anesthesia Postprocedure Evaluation (Signed)
Anesthesia Post Note  Patient: Andrea Blackwell  Procedure(s) Performed: ENDOSCOPIC RETROGRADE CHOLANGIOPANCREATOGRAPHY (ERCP) (N/A )     Patient location during evaluation: PACU Anesthesia Type: General Level of consciousness: awake and alert Pain management: pain level controlled Vital Signs Assessment: post-procedure vital signs reviewed and stable Respiratory status: spontaneous breathing, nonlabored ventilation and respiratory function stable Cardiovascular status: blood pressure returned to baseline and stable Postop Assessment: no apparent nausea or vomiting Anesthetic complications: no    Last Vitals:  Vitals:   12/14/16 1535 12/14/16 2141  BP: (!) 132/55 (!) 146/62  Pulse: 67 60  Resp: 17 16  Temp: 37.1 C 36.8 C  SpO2: 95% 94%    Last Pain:  Vitals:   12/14/16 2141  TempSrc: Oral  PainSc:                  Audry Pili

## 2016-12-14 NOTE — Progress Notes (Signed)
12/14/2016   [address] [place], [place] [address] Phone: [phone]  FAX: [phone]      Andrea Blackwell [id number] 27-Dec-1955  CARE TEAM:  PCP: Patient, No Pcp Per  Outpatient Care Team: Patient Care Team: Patient, No Pcp Per as PCP - General (General Practice)  Inpatient Treatment Team: Treatment Team: Attending Provider: Jonetta Osgood, MD; Consulting Physician: Edison Pace, Md, MD; Technician: Darrick Grinder, NT; Technician: Lucila Maine, NT; Consulting Physician: Carol Ada, MD; Registered Nurse: Gertie Fey, RN; Technician: Demaris Callander, NT; Registered Nurse: Tacey Ruiz, RN; Case Manager: Dessa Phi, RN; Consulting Physician: Milus Banister, MD; Rounding Team: Redmond Baseman, MD; Registered Nurse: Leonie Douglas, RN   Problem List:   Principal Problem:   Gallstone pancreatitis Active Problems:   Acute calculous cholecystitis   Hypothyroidism   Jaundice   Obesity    12/10/2016 Procedure:  ENDOSCOPIC RETROGRADE CHOLANGIOPANCREATOGRAPHY (ERCP)  12/13/2016   POST-OPERATIVE DIAGNOSIS:   Acute on Chronic Calculus Cholecystitis Gallstone pancreatitis Pinhole common bile duct leak. Liver: Probable cholestasis.  Possible steatohepatitis  PROCEDURE:    Laparoscopic cholecystectomy w IOC Partial CBD primary repair Core Liver Biopsy  SURGEON:  [name], MD, FACS.      Assessment/Plan Acute gallstone pancreatitis 2/2 choledocholithiasis   S/p ERCP w/ sphincterotomy 11/3 Dr. Benson Norway  Status post laparocopic cholecystectomy with cholangiogram.  Repair of common bile duct with persistent small leak.  Core liver biopsy  I updated the patient's status to the patient and nurse.  Patient has small pinhole common bile duct leak of uncertain etiology.  Refractory to primary repair given poor tissues.  Plan is for gastrology to repeat ERCP with stent placement.  Bilateral  internal/external drainage to the left this small area heal up.  Her bilirubin is stable.  Her drain is serosanguineous at this point.  She did have clear liquids but is been n.p.o. for an hour and a half.  Recommendations were made.  Questions were answered.  They expressed understanding & appreciation.   Heartburn/GERD: Protonix to p.o. FEN: NPO, IVF  ID: ciprofloxacin  VTE: SCD's    30 minutes spent in review, evaluation, examination, counseling, and coordination of care.  More than 50% of that time was spent in counseling.  [name], M.D., F.A.C.S. Gastrointestinal and Minimally Invasive Surgery [facility] [address], [place], [place] [address] [phone] Main / Paging   12/14/2016    Subjective: (Chief complaint)  Sore at bellybutton but ice pack helps ess sore. Nurse at bedside   Objective:  Vital signs:  Vitals:   12/13/16 2108 12/14/16 0205 12/14/16 0437 12/14/16 0500  BP: (!) 142/68 139/65 134/68   Pulse: 68 62 (!) 57   Resp: _0 Temp: 98.4 F (36.9 C) 97.6 F (36.4 C) 97.8 F (36.6 C)   TempSrc: Oral Oral Oral   SpO2: 96% 98% 97%   Weight:    92.1 kg (203 lb 0.7 oz)  Height:        Last BM Date: 12/11/16  Intake/Output   Yesterday:  11/06 0701 - 11/07 0700 In: 3343.3 [P.O.:240; I.V.:2503.3; IV Piggyback:600] Out: 1490 [Urine:1350; Drains:40; Blood:100] This shift:  No intake/output data recorded.  Bowel function:  Flatus: YES  BM:  YES  Drain: Serosanguinous   Physical Exam:  General: Pt awake/alert/oriented x4 in no acute distress Eyes: PERRL, normal EOM.  Sclera clear.  +  icterus Neuro: CN II-XII intact w/o focal sensory/motor deficits. Lymph: No head/neck/groin lymphadenopathy Psych:  No delerium/psychosis/paranoia HENT: Normocephalic, Mucus membranes moist.  No thrush Neck: Supple, No tracheal deviation Chest: No chest wall pain w good excursion CV:  Pulses intact.  Regular  rhythm MS: Normal AROM mjr joints.  No obvious deformity  Abdomen: Soft.  Nondistended.  Mildly tender at incisions only.  No evidence of peritonitis.  No incarcerated hernias.  Ext:   No deformity.  No mjr edema.  No cyanosis Skin: No petechiae / purpura.  Very jaundiced  Results:   Labs: Results for orders placed or performed during the hospital encounter of 12/09/16 (from the past 29 hour(s))  Surgical pcr screen     Status: Abnormal   Collection Time: 12/12/16 10:32 AM  Result Value Ref Range   MRSA, PCR POSITIVE (A) NEGATIVE    Comment: RESULT CALLED TO, READ BACK BY AND VERIFIED WITH: ORAEGBUNAN,I. RN _0  ON 11.05.18 BY COHEN,K    Staphylococcus aureus POSITIVE (A) NEGATIVE    Comment: (NOTE) The Xpert SA Assay (FDA approved for NASAL specimens in patients 49 years of age and older), is one component of a comprehensive surveillance program. It is not intended to diagnose infection nor to guide or monitor treatment.   Comprehensive metabolic panel     Status: Abnormal   Collection Time: 12/13/16  5:32 AM  Result Value Ref Range   Sodium 140 135 - 145 mmol/L   Potassium 4.2 3.5 - 5.1 mmol/L   Chloride 101 101 - 111 mmol/L   CO2 28 22 - 32 mmol/L   Glucose, Bld 111 (H) 65 - 99 mg/dL   BUN 6 6 - 20 mg/dL   Creatinine, Ser 0.63 0.44 - 1.00 mg/dL   Calcium 9.3 8.9 - 10.3 mg/dL   Total Protein 6.6 6.5 - 8.1 g/dL   Albumin 3.2 (L) 3.5 - 5.0 g/dL   AST 295 (H) 15 - 41 U/L   ALT 505 (H) 14 - 54 U/L   Alkaline Phosphatase 269 (H) 38 - 126 U/L   Total Bilirubin 10.1 (H) 0.3 - 1.2 mg/dL   GFR calc non Af Amer >60 >60 mL/min   GFR calc Af Amer >60 >60 mL/min    Comment: (NOTE) The eGFR has been calculated using the CKD EPI equation. This calculation has not been validated in all clinical situations. eGFR's persistently <60 mL/min signify possible Chronic Kidney Disease.    Anion gap 11 5 - 15  Lipase, blood     Status: None   Collection Time: 12/13/16  5:32 AM  Result  Value Ref Range   Lipase 43 11 - 51 U/L  Ammonia     Status: None   Collection Time: 12/13/16 11:45 AM  Result Value Ref Range   Ammonia 24 9 - 35 umol/L  Comprehensive metabolic panel     Status: Abnormal   Collection Time: 12/14/16  5:30 AM  Result Value Ref Range   Sodium 137 135 - 145 mmol/L   Potassium 4.2 3.5 - 5.1 mmol/L   Chloride 101 101 - 111 mmol/L   CO2 26 22 - 32 mmol/L   Glucose, Bld 190 (H) 65 - 99 mg/dL   BUN 8 6 - 20 mg/dL   Creatinine, Ser 0.64 0.44 - 1.00 mg/dL   Calcium 8.9 8.9 - 10.3 mg/dL   Total Protein 6.2 (L) 6.5 - 8.1 g/dL   Albumin 2.7 (L) 3.5 - 5.0 g/dL   AST 408 (H)  15 - 41 U/L   ALT 585 (H) 14 - 54 U/L   Alkaline Phosphatase 247 (H) 38 - 126 U/L   Total Bilirubin 9.8 (H) 0.3 - 1.2 mg/dL   GFR calc non Af Amer >60 >60 mL/min   GFR calc Af Amer >60 >60 mL/min    Comment: (NOTE) The eGFR has been calculated using the CKD EPI equation. This calculation has not been validated in all clinical situations. eGFR's persistently <60 mL/min signify possible Chronic Kidney Disease.    Anion gap 10 5 - 15  Lipase, blood     Status: None   Collection Time: 12/14/16  5:30 AM  Result Value Ref Range   Lipase 29 11 - 51 U/L  CBC     Status: None   Collection Time: 12/14/16  5:30 AM  Result Value Ref Range   WBC 6.5 4.0 - 10.5 K/uL   RBC 4.17 3.87 - 5.11 MIL/uL   Hemoglobin 12.6 12.0 - 15.0 g/dL   HCT 38.4 36.0 - 46.0 %   MCV 92.1 78.0 - 100.0 fL   MCH 30.2 26.0 - 34.0 pg   MCHC 32.8 30.0 - 36.0 g/dL   RDW 14.0 11.5 - 15.5 %   Platelets 252 150 - 400 K/uL    Imaging / Studies: Dg Cholangiogram Operative  Result Date: 12/14/2016 CLINICAL DATA:  61 year old female with a history of cholelithiasis EXAM: INTRAOPERATIVE CHOLANGIOGRAM TECHNIQUE: Cholangiographic images from the C-arm fluoroscopic device were submitted for interpretation post-operatively. Please see the procedural report for the amount of contrast and the fluoroscopy time utilized. COMPARISON:   CT 12/10/2016, ERCP 12/10/2016 FINDINGS: Surgical instruments project over the upper abdomen. There is attempted cannulation of the cystic duct/gallbladder neck, with small amount of extraluminal contrast within the abdomen. No duct opacification on the limited images. . IMPRESSION: Limited images of intraoperative cholangiogram demonstrates small amount extraluminal contrast. Please refer to the dictated operative report for full details of intraoperative findings and procedure. Electronically Signed   By: Corrie Mckusick D.O.   On: 12/14/2016 07:20    Medications / Allergies: per chart  Antibiotics: Anti-infectives (From admission, onward)   Start     Dose/Rate Route Frequency Ordered Stop   12/13/16 1722  ciprofloxacin (CIPRO) 400 MG/200ML IVPB    Comments:  Enrigue Catena   : cabinet override      12/13/16 1722 12/13/16 1732   12/13/16 1630  ciprofloxacin (CIPRO) IVPB 400 mg  Status:  Discontinued    Comments:  Pharmacy may adjust dosing strength, interval, or rate of medication as needed for optimal therapy for the patient Send with patient on call to the OR.  Anesthesia to complete antibiotic administration <47mn prior to incision per [facility]   400 mg 200 mL/hr over 60 Minutes Intravenous On call to O.R. 12/13/16 1127 12/13/16 2047   12/13/16 1630  metroNIDAZOLE (FLAGYL) IVPB 500 mg    Comments:  Pharmacy may adjust dosing strength, interval, or rate of medication as needed for optimal therapy for the patient Send with patient on call to the OR.  Anesthesia to complete antibiotic administration <69m prior to [facility]  500 mg 100 mL/hr over 60 Minutes Intravenous On call to O.R. 12/13/16 1127 12/13/16 1733   12/12/16 0715  clindamycin (CLEOCIN) IVPB 900 mg     900 mg 100 mL/hr over 30 Minutes Intravenous On call to O.R. 12/12/16 [id number]/06/18 0559   12/12/16 0715  gentamicin (GARAMYCIN) 370 mg in dextrose 5 %  100 mL IVPB     5 mg/kg  74.1 kg (Adjusted) 109.3  mL/hr over 60 Minutes Intravenous On call to O.R. 12/12/16 0712 12/13/16 0559   12/10/16 1300  ciprofloxacin (CIPRO) IVPB 400 mg     400 mg 200 mL/hr over 60 Minutes Intravenous 2 times daily 12/10/16 1217          Note: Portions of this report may have been transcribed using voice recognition software. Every effort was made to ensure accuracy; however, inadvertent computerized transcription errors may be present.   Any transcriptional errors that result from this process are unintentional.     [name], M.D., F.A.C.S. Gastrointestinal and Minimally Invasive Surgery [facility] [address], [place], [place] [address] [phone] Main / Paging   12/14/2016

## 2016-12-14 NOTE — Anesthesia Preprocedure Evaluation (Addendum)
Anesthesia Evaluation  Patient identified by MRN, date of birth, ID band Patient awake    Reviewed: Allergy & Precautions, NPO status , Patient's Chart, lab work & pertinent test results  Airway Mallampati: II  TM Distance: >3 FB Neck ROM: Full    Dental  (+) Teeth Intact, Dental Advisory Given, Caps   Pulmonary neg pulmonary ROS,    breath sounds clear to auscultation       Cardiovascular Exercise Tolerance: Good negative cardio ROS   Rhythm:Regular Rate:Normal     Neuro/Psych Depression negative neurological ROS     GI/Hepatic Neg liver ROS, Bile duct leak s/p cholecystectomy   Endo/Other  Hypothyroidism Obesity  Renal/GU negative Renal ROS     Musculoskeletal negative musculoskeletal ROS (+)   Abdominal   Peds  Hematology negative hematology ROS (+)   Anesthesia Other Findings Anaphylaxis to PCN  Reproductive/Obstetrics negative OB ROS                            Anesthesia Physical  Anesthesia Plan  ASA: II  Anesthesia Plan: General   Post-op Pain Management:    Induction: Intravenous  PONV Risk Score and Plan: 3 and Ondansetron, Dexamethasone, Midazolam and Treatment may vary due to age or medical condition  Airway Management Planned: Oral ETT  Additional Equipment: None  Intra-op Plan:   Post-operative Plan: Extubation in OR  Informed Consent: I have reviewed the patients History and Physical, chart, labs and discussed the procedure including the risks, benefits and alternatives for the proposed anesthesia with the patient or authorized representative who has indicated his/her understanding and acceptance.   Dental advisory given  Plan Discussed with: CRNA  Anesthesia Plan Comments:         Anesthesia Quick Evaluation

## 2016-12-14 NOTE — Progress Notes (Signed)
To Endo for ERCP

## 2016-12-14 NOTE — Progress Notes (Signed)
Back from Endo>A/Ox4 skin WDI.JP w/ bloody drng ambulated to BR

## 2016-12-14 NOTE — Telephone Encounter (Signed)
-----   Message from Milus Banister, MD sent at 12/14/2016  2:43 PM EST ----- She needs ERCP at [facility] in 6 weeks for stent removal.  On a Thursday with MAC.  Thanks  Currently still in hosp, will be for another few days probably.  thanks

## 2016-12-14 NOTE — Op Note (Signed)
[facility] Patient Name: Savahanna Almendariz Procedure Date: 12/14/2016 MRN: [id number] Attending MD: Milus Banister , MD Date of Birth: 06/07/1955 CSN: [id number] Age: 61 Admit Type: Inpatient Procedure:                ERCP Indications:              Bile leak noted during Dr. Johney Maine lap chole                            yesterday; Dr. Benson Norway ERCP 3-4 days ago with                            sphincterotomy and stone removal Providers:                Milus Banister, MD, Zenon Mayo, RN, Elspeth Cho Tech., Technician, Christell Faith, CRNA Referring MD:              Medicines:                General Anesthesia, Indomethacin 876 mg PR Complications:            No immediate complications. Estimated blood loss:                            None Estimated Blood Loss:     Estimated blood loss: none. Procedure:                Pre-Anesthesia Assessment:                           - Prior to the procedure, a History and Physical                            was performed, and patient medications and                            allergies were reviewed. The patient's tolerance of                            previous anesthesia was also reviewed. The risks                            and benefits of the procedure and the sedation                            options and risks were discussed with the patient.                            All questions were answered, and informed consent                            was obtained. Prior Anticoagulants: The patient has  taken no previous anticoagulant or antiplatelet                            agents. ASA Grade Assessment: II - A patient with                            mild systemic disease. After reviewing the risks                            and benefits, the patient was deemed in                            satisfactory condition to undergo the procedure.                           After obtaining  informed consent, the scope was                            passed under direct vision. Throughout the                            procedure, the patient's blood pressure, pulse, and                            oxygen saturations were monitored continuously. The                            [id number] was introduced through the mouth, and used                            to inject contrast into and used to inject contrast                            into the bile duct. The ERCP was accomplished                            without difficulty. The patient tolerated the                            procedure well. Scope In: Scope Out: Findings:      A scout film of the abdomen was obtained. Surgical clips and a drain       were seen in the area of the right upper quadrant of the abdomen. The       esophagus was successfully intubated under direct vision. The scope was       advanced to the region of the major papilla in the descending duodenum       without detailed examination of the pharynx, larynx and associated       structures, and upper GI tract. The upper GI tract was grossly normal.       There was evidence of recent biliary sphincterotomy. A 44 [employer] over       a .035 hydrawire was used to cannulate the bile duct and contrast was       injected. The intra-hepatic  and extra-hepatic biliary duct system was       normal, there was no bile duct leak or retained stone. Given the obvious       CBD leak during cholecystectomy yesterday and the potential intermittent       nature of a leak, one 10 Fr by 5 cm plastic stent with a single external       flap and a single internal flap was placed into the common bile duct.       The distal 1cm of the stent extended into the duodenal lumen in good       position. The main pancreatic duct was never cannulated or injected with       dye. Impression:               - Normal extrahepatic biliary tree without evident                            bile leak  today.                           - Given the obvious CBD leak during cholecystectomy                            yesterday and the potential intermittent nature of                            a leak, I placed a 10Fr 5cm plastic stent across                            the major papilla in good position. Moderate Sedation:      N/A- Per Anesthesia Care Recommendation:           - Return patient to hospital ward for ongoing care.                           - She will need repeat ERCP in 6-7 weeks to removed                            the stent and recheck for leak. Procedure Code(s):        --- Professional ---                           [phone], Endoscopic retrograde                            cholangiopancreatography (ERCP); with placement of                            endoscopic stent into biliary or pancreatic duct,                            including pre- and post-dilation and guide wire                            passage, when performed, including sphincterotomy,  when performed, each stent Diagnosis Code(s):        --- Professional ---                           K83.8, Other specified diseases of biliary tract CPT copyright 2016 [employer]. All rights reserved. The codes documented in this report are preliminary and upon coder review may  be revised to meet current compliance requirements. Milus Banister, MD 12/14/2016 2:34:31 PM This report has been signed electronically. Number of Addenda: 0

## 2016-12-14 NOTE — Progress Notes (Signed)
PROGRESS NOTE        PATIENT DETAILS Name: Andrea Blackwell Age: 61 y.o. Sex: female Date of Birth: 18-Sep-1955 Admit Date: 12/09/2016 Admitting Physician Jani Gravel, MD [id number], No Pcp Per  Brief Narrative: Patient is a 61 y.o. female with past medical history of hypothyroidism admitted with abdominal pain, vomiting, she was found to have acute gallstone pancreatitis with choledocholithiasis. She was provided with supportive care, she was seen by GI and general surgery. She underwent ERCP on 11/3 with removal of a CBD stone and a sphincterotomy, subsequently general surgery was consulted, patient then underwent a laparoscopic cholecystectomy on 11/6, she was found to have a small bile leak intraoperatively. GI has been reconsulted, for ERCP and possible biliary stenting. See below for further details.   Subjective: Still looks jaundiced-but feels overall better.   Assessment/Plan: Gallstone pancreatitis: Slowly improving-LFTs continue to stay significantly elevated-underwent ERCP on 11/3 with CBD stone removal and sphincterotomy, subsequently underwent laparoscopic cholecystectomy where she was found to have a bile leak intraoperatively. GI has been reconsulted, with potential plans to repeat a ERCP and place a biliary stent. Continue supportive care, follow LFTs.  Acute on chronic calculus cholecystitis with choledocholithiasis and obstructive jaundice: See above.  One episode of hematemesis: This occurred right before she presented to the hospital, no further episodes since then. Supportive care-doubt further workup is required at this point. 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  DVT Prophylaxis: SCD's  Code Status: Full code    Family Communication: None at bedside  Disposition Plan: Remain inpatient-will require several more days of hospitalization prior to discharge.  Antimicrobial agents: Anti-infectives (From admission, onward)   Start     Dose/Rate Route Frequency Ordered Stop   12/13/16 1722  ciprofloxacin (CIPRO) 400 MG/200ML IVPB    Comments:  Enrigue Catena   : cabinet override      12/13/16 1722 12/13/16 1732   12/13/16 1630  ciprofloxacin (CIPRO) IVPB 400 mg  Status:  Discontinued    Comments:  Pharmacy may adjust dosing strength, interval, or rate of medication as needed for optimal therapy for the patient Send with patient on call to the OR.  Anesthesia to complete antibiotic administration <84min prior to incision per [facility].   400 mg 200 mL/hr over 60 Minutes Intravenous On call to O.R. 12/13/16 1127 12/13/16 2047   12/13/16 1630  metroNIDAZOLE (FLAGYL) IVPB 500 mg    Comments:  Pharmacy may adjust dosing strength, interval, or rate of medication as needed for optimal therapy for the patient Send with patient on call to the OR.  Anesthesia to complete antibiotic administration <12min prior to incision per [facility].   500 mg 100 mL/hr over 60 Minutes Intravenous On call to O.R. 12/13/16 1127 12/13/16 1733   12/12/16 0715  clindamycin (CLEOCIN) IVPB 900 mg     900 mg 100 mL/hr over 30 Minutes Intravenous On call to O.R. 12/12/16 6812 12/13/16 0559   12/12/16 0715  gentamicin (GARAMYCIN) 370 mg in dextrose 5 % 100 mL IVPB     5 mg/kg  74.1 kg (Adjusted) 109.3 mL/hr over 60 Minutes Intravenous On call to O.R. 12/12/16 7517 12/13/16 0559  12/10/16 1300  ciprofloxacin (CIPRO) IVPB 400 mg     400 mg 200 mL/hr over 60 Minutes Intravenous 2 times daily 12/10/16 1217       Procedures: 11/3>>ERCP 11/6>Lap cholecystectomy  CONSULTS:  GI and general surgery  Time spent: 25- minutes-Greater than 50% of this time was spent in counseling, explanation of diagnosis, planning of further  management, and coordination of care.  MEDICATIONS: Scheduled Meds: . Chlorhexidine Gluconate Cloth  6 each Topical Once  . Chlorhexidine Gluconate Cloth  6 each Topical Q0600  . escitalopram  10 mg Oral QHS  . gabapentin  300 mg Oral QHS  . Influenza vac split quadrivalent PF  0.5 mL Intramuscular Tomorrow-1000  . levothyroxine  75 mcg Oral QAC breakfast  . lip balm  1 application Topical BID  . mupirocin ointment  1 application Nasal BID  . pantoprazole  40 mg Oral Q1200  . psyllium  1 packet Oral Daily  . saccharomyces boulardii  250 mg Oral BID   Continuous Infusions: . sodium chloride    . ciprofloxacin Stopped (12/14/16 1022)  . lactated ringers    . lactated ringers    . lactated ringers 50 mL/hr at 12/13/16 2056  . methocarbamol (ROBAXIN)  IV     PRN Meds:.alum & mag hydroxide-simeth, bisacodyl, bismuth subsalicylate, diphenhydrAMINE, diphenhydrAMINE, guaiFENesin-dextromethorphan, hydrALAZINE, hydrocortisone, hydrocortisone cream, HYDROmorphone (DILAUDID) injection, lactated ringers, lactated ringers, magic mouthwash, menthol-cetylpyridinium, methocarbamol (ROBAXIN)  IV, metoCLOPramide (REGLAN) injection, metoprolol tartrate, ondansetron (ZOFRAN) IV, oxyCODONE, phenol, prochlorperazine   PHYSICAL EXAM: Vital signs: Vitals:   12/13/16 2108 12/14/16 0205 12/14/16 0437 12/14/16 0500  BP: (!) 142/68 139/65 134/68   Pulse: 68 62 (!) 57   Resp: 16 16 18    Temp: 98.4 F (36.9 C) 97.6 F (36.4 C) 97.8 F (36.6 C)   TempSrc: Oral Oral Oral   SpO2: 96% 98% 97%   Weight:    92.1 kg (203 lb 0.7 oz)  Height:       Filed Weights   12/12/16 0500 12/13/16 0540 12/14/16 0500  Weight: 94.4 kg (208 lb 1.8 oz) 92.2 kg (203 lb 4.2 oz) 92.1 kg (203 lb 0.7 oz)   Body mass index is 32.28 kg/m.   General appearance :Awake, alert, not in any distress. Speech Clear.  Eyes:, pupils equally reactive to light and accomodation,no scleral icterus. HEENT: Atraumatic and  Normocephalic Neck: supple, no JVD. No cervical lymphadenopathy.  Resp:Good air entry bilaterally, no added sounds  CVS: S1 S2 regular, no murmurs.  GI: Bowel sounds present, mildly tender at the operative site, but no peritoneal signs. Drain in place. Extremities: B/L Lower Ext shows no edema, both legs are warm to touch Neurology:  speech clear,Non focal, sensation is grossly intact. Psychiatric: Normal judgment and insight. Alert and oriented x 3. Normal mood. Musculoskeletal:No digital cyanosis Skin:No Rash, warm and dry Wounds:N/A  I have personally reviewed following labs and imaging studies  LABORATORY DATA: CBC: Recent Labs  Lab 12/10/16 0501 12/10/16 0900 12/11/16 1011 12/12/16 0529 12/14/16 0530  WBC 4.9 3.8* 4.3 4.0 6.5  HGB 12.2 12.2 12.0 11.3* 12.6  HCT 36.9 36.5 35.5* 34.5* 38.4  MCV 92.0 91.7 92.0 92.7 92.1  PLT 223 221 210 193 778    Basic Metabolic Panel: Recent Labs  Lab 12/10/16 0900 12/11/16 1011 12/12/16 0529 12/13/16 0532 12/14/16 0530  NA 140 137 141 140 137  K 3.8 4.3 4.2 4.2 4.2  CL 107 106 108 101 101  CO2 24 22 25 28  26  GLUCOSE 94 129* 87 111* 190*  BUN 9 10 9 6 8   CREATININE 0.40* 0.53 0.56 0.63 0.64  CALCIUM 8.7* 8.6* 8.7* 9.3 8.9    GFR: Estimated Creatinine Clearance: 87.4 mL/min (by C-G formula based on SCr of 0.64 mg/dL).  Liver Function Tests: Recent Labs  Lab 12/10/16 0900 12/11/16 1011 12/12/16 0529 12/13/16 0532 12/14/16 0530  AST 175* 162* 184* 295* 408*  ALT 384* 334* 354* 505* 585*  ALKPHOS 228* 213* 225* 269* 247*  BILITOT 9.5* 9.1* 8.5* 10.1* 9.8*  PROT 5.9* 5.6* 5.7* 6.6 6.2*  ALBUMIN 2.8* 2.7* 2.8* 3.2* 2.7*   Recent Labs  Lab 12/10/16 0125 12/10/16 0501 12/11/16 0632 12/13/16 0532 12/14/16 0530  LIPASE 2,778* 1,764* 68* 43 29   Recent Labs  Lab 12/13/16 1145  AMMONIA 24    Coagulation Profile: Recent Labs  Lab 12/10/16 1531 12/14/16 1147  INR 0.93 1.05    Cardiac Enzymes: Recent  Labs  Lab 12/10/16 0001  TROPONINI <0.03    BNP (last 3 results) No results for input(s): PROBNP in the last 8760 hours.  HbA1C: No results for input(s): HGBA1C in the last 72 hours.  CBG: Recent Labs  Lab 12/10/16 0909  GLUCAP 87    Lipid Profile: No results for input(s): CHOL, HDL, LDLCALC, TRIG, CHOLHDL, LDLDIRECT in the last 72 hours.  Thyroid Function Tests: No results for input(s): TSH, T4TOTAL, FREET4, T3FREE, THYROIDAB in the last 72 hours.  Anemia Panel: No results for input(s): VITAMINB12, FOLATE, FERRITIN, TIBC, IRON, RETICCTPCT in the last 72 hours.  Urine analysis:    Component Value Date/Time   COLORURINE AMBER (A) 12/09/2016 2355   APPEARANCEUR CLEAR 12/09/2016 2355   LABSPEC 1.011 12/09/2016 2355   PHURINE 5.0 12/09/2016 2355   GLUCOSEU NEGATIVE 12/09/2016 2355   HGBUR MODERATE (A) 12/09/2016 2355   BILIRUBINUR SMALL (A) 12/09/2016 2355   KETONESUR NEGATIVE 12/09/2016 2355   PROTEINUR NEGATIVE 12/09/2016 2355   NITRITE NEGATIVE 12/09/2016 2355   LEUKOCYTESUR TRACE (A) 12/09/2016 2355    Sepsis Labs: Lactic Acid, Venous No results found for: LATICACIDVEN  MICROBIOLOGY: Recent Results (from the past 240 hour(s))  Surgical pcr screen     Status: Abnormal   Collection Time: 12/12/16 10:32 AM  Result Value Ref Range Status   MRSA, PCR POSITIVE (A) NEGATIVE Final    Comment: RESULT CALLED TO, READ BACK BY AND VERIFIED WITH: ORAEGBUNAN,I. RN @1402  ON 11.05.18 BY COHEN,K    Staphylococcus aureus POSITIVE (A) NEGATIVE Final    Comment: (NOTE) The Xpert SA Assay (FDA approved for NASAL specimens in patients 57 years of age and older), is one component of a comprehensive surveillance program. It is not intended to diagnose infection nor to guide or monitor treatment.     RADIOLOGY STUDIES/RESULTS: Dg Chest 2 View  Result Date: 12/10/2016 CLINICAL DATA:  61 year old female with shortness of breath. EXAM: CHEST  2 VIEW COMPARISON:  None.  FINDINGS: The lungs are clear. There is no pleural effusion or pneumothorax. The cardiac silhouette is within normal limits. There is no acute osseous pathology. IMPRESSION: No active cardiopulmonary disease. Electronically Signed   By: Anner Crete M.D.   On: 12/10/2016 01:05   Dg Cholangiogram Operative  Result Date: 12/14/2016 CLINICAL DATA:  61 year old female with a history of cholelithiasis EXAM: INTRAOPERATIVE CHOLANGIOGRAM TECHNIQUE: Cholangiographic images from the C-arm fluoroscopic device were submitted for interpretation post-operatively. Please see the procedural report for the amount of contrast and the fluoroscopy time utilized. COMPARISON:  CT  12/10/2016, ERCP 12/10/2016 FINDINGS: Surgical instruments project over the upper abdomen. There is attempted cannulation of the cystic duct/gallbladder neck, with small amount of extraluminal contrast within the abdomen. No duct opacification on the limited images. . IMPRESSION: Limited images of intraoperative cholangiogram demonstrates small amount extraluminal contrast. Please refer to the dictated operative report for full details of intraoperative findings and procedure. Electronically Signed   By: Corrie Mckusick D.O.   On: 12/14/2016 07:20   Ct Abdomen Pelvis W Contrast  Result Date: 12/10/2016 CLINICAL DATA:  Unintended weight loss. Abdominal pain. Weakness and nausea. Jaundice. Back pain. Elevated liver function tests. EXAM: CT ABDOMEN AND PELVIS WITH CONTRAST TECHNIQUE: Multidetector CT imaging of the abdomen and pelvis was performed using the standard protocol following bolus administration of intravenous contrast. CONTRAST:  164mL ISOVUE-300 IOPAMIDOL (ISOVUE-300) INJECTION 61% COMPARISON:  None. FINDINGS: LOWER CHEST: Dependent atelectasis. Included heart size is normal. No pericardial effusion. HEPATOBILIARY: Mild intrahepatic biliary dilatation. Liver is otherwise unremarkable. Distended gallbladder, subcentimeter layering faint  suspected gallstones. Dilated cystic duct. Proximal Common bile duct is 9 mm. Subcentimeter intermediate density filling defect distal Common bile duct (coronal 87/191, sagittal 92/191). PANCREAS: Normal. SPLEEN: Normal. ADRENALS/URINARY TRACT: Kidneys are orthotopic, demonstrating symmetric enhancement. No nephrolithiasis, hydronephrosis or solid renal masses. The unopacified ureters are normal in course and caliber. Delayed imaging through the kidneys demonstrates symmetric prompt contrast excretion within the proximal urinary collecting system. Urinary bladder is partially distended and unremarkable. Normal adrenal glands. STOMACH/BOWEL: The stomach, small and large bowel are normal in course and caliber without inflammatory changes, sensitivity decreased without oral contrast. Mild sigmoid colonic diverticulosis. Normal appendix. VASCULAR/LYMPHATIC: Aortoiliac vessels are normal in course and caliber. Mild calcific atherosclerosis. No lymphadenopathy by CT size criteria. REPRODUCTIVE: 4.8 x 4.6 cm calcified RIGHT pelvic mass contiguous with RIGHT adnexae and posterior uterus. Small similarly calcified mass within the uterus. OTHER: No intraperitoneal free fluid or free air. MUSCULOSKELETAL: Nonacute. Mild degenerative change of the spine. Small fat containing umbilical hernia. IMPRESSION: 1. Intra and extra hepatic biliary dilatation to the level of the distal CBD were there is an obstructing gallstone, less likely mass. 2. Cholelithiasis without CT findings of acute cholecystitis. 3. 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 4. Acute findings discussed with and reconfirmed by Dr.DAVID YAO on 12/10/2016 at 2:20 am. Electronically Signed   By: Elon Alas M.D.   On: 12/10/2016 02:22   Dg Ercp Biliary & Pancreatic Ducts  Result Date: 12/10/2016 CLINICAL DATA:  Bile duct stone EXAM: ERCP TECHNIQUE: Multiple spot images obtained with the  fluoroscopic device and submitted for interpretation post-procedure. FLUOROSCOPY TIME:  Fluoroscopy Time:  1 minutes and 42 seconds Radiation Exposure Index (if provided by the fluoroscopic device): Number of Acquired Spot Images: 4 COMPARISON:  None. FINDINGS: Contrast fills the biliary tree. Balloon stone retrieval is documented. IMPRESSION: See above. These images were submitted for radiologic interpretation only. Please see the procedural report for the amount of contrast and the fluoroscopy time utilized. Electronically Signed   By: Marybelle Killings M.D.   On: 12/10/2016 13:30     LOS: 4 days   Oren Binet, MD  [facility] Pager:[phone]  If 7PM-7AM, please contact night-coverage [email or web address] Password [facility] 12/14/2016, 12:27 PM

## 2016-12-14 NOTE — Anesthesia Procedure Notes (Signed)
Procedure Name: Intubation Date/Time: 12/14/2016 1:51 PM Performed by: West Pugh, CRNA Pre-anesthesia Checklist: Patient identified, Emergency Drugs available, Suction available, Patient being monitored and Timeout performed Patient Re-evaluated:Patient Re-evaluated prior to induction Oxygen Delivery Method: Circle system utilized Preoxygenation: Pre-oxygenation with 100% oxygen Induction Type: IV induction Ventilation: Mask ventilation without difficulty Laryngoscope Size: Glidescope and 3 Grade View: Grade I Tube type: Oral Tube size: 7.5 mm Number of attempts: 1 Airway Equipment and Method: Stylet and Video-laryngoscopy Placement Confirmation: ETT inserted through vocal cords under direct vision,  positive ETCO2,  CO2 detector and breath sounds checked- equal and bilateral Secured at: 22 cm Tube secured with: Tape Dental Injury: Teeth and Oropharynx as per pre-operative assessment  Difficulty Due To: Difficult Airway-  due to edematous airway Comments: DL x1 by CRNA. View grade 2 with swollen glottic opening and large epiglottis to obscure view, with MAC 4. Unable to pass ETT. Anesthesiologist utilized [employer] 3 without success. Glidescope utilized with 1 attempt and successfully passed ETT.  Grade 1 view. Patient was mask ventilated throughout attempts.

## 2016-12-14 NOTE — Consult Note (Signed)
Consultation  Referring Provider: Dr Andrey Campanile Care Physician:  Patient, No Pcp Per Primary Gastroenterologist:  Dr. Benson Norway?  Reason for Consultation:  Bile leak  HPI: Andrea Blackwell is a 61 y.o. female who was admitted on 12/09/2016 with acute gallstone pancreatitis. She was initially seen in consultation by Dr. Carol Ada on 12/10/2016 after imaging showed at least 1 distal common bile duct stone. She underwent ERCP on 11 3 with sphincterotomy and extraction of 2 common bile duct stones. She underwent laparoscopic cholecystectomy yesterday 12/13/2016, and also had a core liver biopsy done because of persistently elevated LFTs and a liver which appeared fatty on exam. She was found to have acute on chronic gallbladder inflammatory changes and poor tissue with some evidence of ischemia. At the time of surgery was noted she had a pinhole leak of the common bile duct about 15 mm from the cystic duct/common duct confluence. Repeat ERCP is requested for stenting. Labs have been reviewed, CBC is normal, lipase has normalized, T bili remains elevated at 9.8, alkaline phosphatase 247, AST 408, ALT of 585.  Pt has had some increase in upper abdominal pain this am. Remains Jaundiced   Past Medical History:  Diagnosis Date  . Hypothyroidism     Past Surgical History:  Procedure Laterality Date  . OVARY SURGERY      Prior to Admission medications   Medication Sig Start Date End Date Taking? Authorizing Provider  acetaminophen (TYLENOL) 500 MG tablet Take 500 mg by mouth every 8 (eight) hours as needed (back and stomach pain).   Yes [provider]  bismuth subsalicylate (PEPTO BISMOL) 262 MG/15ML suspension Take 30 mLs by mouth every 6 (six) hours as needed for indigestion or diarrhea or loose stools.   Yes [provider]  escitalopram (LEXAPRO) 10 MG tablet Take 10 mg by mouth at bedtime.   Yes [provider]  levothyroxine (SYNTHROID, LEVOTHROID) 75 MCG  tablet Take 75 mcg by mouth daily before breakfast.   Yes [provider]  oxyCODONE (OXY IR/ROXICODONE) 5 MG immediate release tablet Take 1-2 tablets (5-10 mg total) every 6 (six) hours as needed by mouth for moderate pain, severe pain or breakthrough pain. 12/13/16   Michael Boston, MD    Current Facility-Administered Medications  Medication Dose Route Frequency Provider Last Rate Last Dose  . alum & mag hydroxide-simeth (MAALOX/MYLANTA) 200-200-20 MG/5ML suspension 30 mL  30 mL Oral Q6H PRN Michael Boston, MD      . bisacodyl (DULCOLAX) suppository 10 mg  10 mg Rectal Q12H PRN Michael Boston, MD      . bismuth subsalicylate (PEPTO BISMOL) 262 MG/15ML suspension 30 mL  30 mL Oral Q8H PRN Michael Boston, MD      . Chlorhexidine Gluconate Cloth 2 % PADS 6 each  6 each Topical Once Michael Boston, MD      . Chlorhexidine Gluconate Cloth 2 % PADS 6 each  6 each Topical Q0600 Bonnielee Haff, MD   6 each at 12/14/16 [phone]  . ciprofloxacin (CIPRO) IVPB 400 mg  400 mg Intravenous BID Eudelia Bunch, [facility]   Stopped at 12/13/16 2218  . diphenhydrAMINE (BENADRYL) capsule 25 mg  25 mg Oral Q6H PRN Michael Boston, MD      . diphenhydrAMINE (BENADRYL) injection 12.5-25 mg  12.5-25 mg Intravenous Q6H PRN Michael Boston, MD      . escitalopram (LEXAPRO) tablet 10 mg  10 mg Oral Loma Sousa, MD   10 mg at 12/13/16 2108  .  gabapentin (NEURONTIN) capsule 300 mg  300 mg Oral Ardeen Fillers, MD   300 mg at 12/13/16 2108  . guaiFENesin-dextromethorphan (ROBITUSSIN DM) 100-10 MG/5ML syrup 10 mL  10 mL Oral Q4H PRN Michael Boston, MD      . hydrALAZINE (APRESOLINE) injection 5-20 mg  5-20 mg Intravenous Q6H PRN Michael Boston, MD      . hydrocortisone (ANUSOL-HC) 2.5 % rectal cream 1 application  1 application Topical QID PRN Michael Boston, MD      . hydrocortisone cream 1 % 1 application  1 application Topical TID PRN Michael Boston, MD      . HYDROmorphone (DILAUDID) injection 0.5-2 mg  0.5-2 mg Intravenous  Q2H PRN Michael Boston, MD      . Influenza vac split quadrivalent PF (FLUARIX) injection 0.5 mL  0.5 mL Intramuscular Tomorrow-1000 Jani Gravel, MD      . lactated ringers bolus 1,000 mL  1,000 mL Intravenous Q8H PRN Gross, Remo Lipps, MD      . lactated ringers bolus 1,000 mL  1,000 mL Intravenous Q8H PRN Michael Boston, MD      . lactated ringers infusion   Intravenous Continuous Nolon Nations, MD 50 mL/hr at 12/13/16 2056    . levothyroxine (SYNTHROID, LEVOTHROID) tablet 75 mcg  75 mcg Oral QAC breakfast Jani Gravel, MD   75 mcg at 12/13/16 0932  . lip balm (CARMEX) ointment 1 application  1 application Topical BID Michael Boston, MD   1 application at 22/63/33 2218  . magic mouthwash  15 mL Oral QID PRN Michael Boston, MD      . menthol-cetylpyridinium (CEPACOL) lozenge 3 mg  1 lozenge Oral PRN Michael Boston, MD      . methocarbamol (ROBAXIN) 1,000 mg in dextrose 5 % 50 mL IVPB  1,000 mg Intravenous Q6H PRN Michael Boston, MD      . metoCLOPramide (REGLAN) injection 5-10 mg  5-10 mg Intravenous Q6H PRN Michael Boston, MD      . metoprolol tartrate (LOPRESSOR) injection 5 mg  5 mg Intravenous Q6H PRN Michael Boston, MD      . mupirocin ointment (BACTROBAN) 2 % 1 application  1 application Nasal BID Bonnielee Haff, MD   1 application at 54/56/25 2109  . ondansetron (ZOFRAN) injection 4 mg  4 mg Intravenous Q6H PRN Jani Gravel, MD   4 mg at 12/13/16 1459  . oxyCODONE (Oxy IR/ROXICODONE) immediate release tablet 5-10 mg  5-10 mg Oral Q4H PRN Michael Boston, MD   5 mg at 12/13/16 2108  . pantoprazole (PROTONIX) EC tablet 40 mg  40 mg Oral Q1200 Michael Boston, MD      . phenol (CHLORASEPTIC) mouth spray 1-2 spray  1-2 spray Mouth/Throat PRN Michael Boston, MD      . prochlorperazine (COMPAZINE) injection 5-10 mg  5-10 mg Intravenous Q4H PRN Michael Boston, MD      . psyllium (HYDROCIL/METAMUCIL) packet 1 packet  1 packet Oral Daily Michael Boston, MD   1 packet at 12/13/16 2219  . saccharomyces boulardii  (FLORASTOR) capsule 250 mg  250 mg Oral BID Michael Boston, MD   250 mg at 12/13/16 2108    Allergies as of 12/09/2016 - Review Complete 12/09/2016  Allergen Reaction Noted  . Penicillins Anaphylaxis 12/09/2016    History reviewed. No pertinent family history.  Social History   Socioeconomic History  . Marital status: Divorced    Spouse name: Not on file  . Number of children: Not on file  . Years of  education: Not on file  . Highest education level: Not on file  Social Needs  . Financial resource strain: Not on file  . Food insecurity - worry: Not on file  . Food insecurity - inability: Not on file  . Transportation needs - medical: Not on file  . Transportation needs - non-medical: Not on file  Occupational History  . Not on file  Tobacco Use  . Smoking status: Never Smoker  . Smokeless tobacco: Never Used  Substance and Sexual Activity  . Alcohol use: No  . Drug use: No  . Sexual activity: Not on file  Other Topics Concern  . Not on file  Social History Narrative  . Not on file    Review of Systems: Pertinent positive and negative review of systems were noted in the above HPI section.  All other review of systems was otherwise negative. Physical Exam: Vital signs in last 24 hours: Temp:  [97.6 F (36.4 C)-98.5 F (36.9 C)] 97.8 F (36.6 C) (11/07 0437) Pulse Rate:  [51-78] 57 (11/07 0437) Resp:  [8-18] 18 (11/07 0437) BP: (134-170)/(58-68) 134/68 (11/07 0437) SpO2:  [93 %-99 %] 97 % (11/07 0437) Weight:  [203 lb 0.7 oz (92.1 kg)] 203 lb 0.7 oz (92.1 kg) (11/07 0500) Last BM Date: 12/11/16 General:   Alert,  Well-developed, well-nourished, very pleasant and cooperative , somewhat anxious, jaundiced Head:  Normocephalic and atraumatic. Eyes:  Sclera are icteric, Conjunctiva pink. Ears:  Normal auditory acuity. Nose:  No deformity, discharge,  or lesions. Mouth:  No deformity or lesions.   Neck:  Supple; no masses or thyromegaly. Lungs:  Clear throughout to  auscultation.   No wheezes, crackles, or rhonchi. Heart:  Regular rate and rhythm; no murmurs, clicks, rubs,  or gallops. Abdomen:  Soft, tender across the upper abdomen BS active,nonpalp mass or hsm.   Rectal:  Deferred  Msk:  Symmetrical without gross deformities. . Pulses:  Normal pulses noted. Extremities:  Without clubbing or edema. Neurologic:  Alert and  oriented x4;  grossly normal neurologically. Skin:  Jaundiced.Marland Kitchen Psych:  Alert and cooperative. Normal mood and affect.  Intake/Output from previous day: 11/06 0701 - 11/07 0700 In: 3343.3 [P.O.:240; I.V.:2503.3; IV Piggyback:600] Out: 1490 [Urine:1350; Drains:40; Blood:100] Intake/Output this shift: No intake/output data recorded.  Lab Results: Recent Labs    12/11/16 1011 12/12/16 0529 12/14/16 0530  WBC 4.3 4.0 6.5  HGB 12.0 11.3* 12.6  HCT 35.5* 34.5* 38.4  PLT 210 193 252   BMET Recent Labs    12/12/16 0529 12/13/16 0532 12/14/16 0530  NA 141 140 137  K 4.2 4.2 4.2  CL 108 101 101  CO2 25 28 26   GLUCOSE 87 111* 190*  BUN 9 6 8   CREATININE 0.56 0.63 0.64  CALCIUM 8.7* 9.3 8.9   LFT Recent Labs    12/14/16 0530  PROT 6.2*  ALBUMIN 2.7*  AST 408*  ALT 585*  ALKPHOS 247*  BILITOT 9.8*   PT/INR No results for input(s): LABPROT, INR in the last 72 hours. Hepatitis Panel No results for input(s): HEPBSAG, HCVAB, HEPAIGM, HEPBIGM in the last 72 hours.        IMPRESSION:  #61 61 year old white female admitted with acute gallstone pancreatitis, she is status post ERCP sphincterotomy and stone extraction on 12/10/2016 and underwent laparoscopic cholecystectomy yesterday with finding of significant inflammatory changes of the gallbladder and surrounding tissues. A pinhole leak was noted of the CBD proximally just below the confluence with the cystic duct. Repeat  ERCP with stent is requested for bile leak  #2 persistent significantly elevated LFTs-would have expected to trend down further post stone  extraction. Liver appeared to have at least fatty changes visually at the time of surgery yesterday and core liver biopsy was done-path pending Need to rule out underlying chronic liver disease/Nash or other  #3 hypothyroidism #4 history of depression  Plan; nothing by mouth Continue IV Cipro Patient is scheduled for ERCP with stent with Dr. Ardis Hughs later today. Procedure was discussed in detail with the patient including risks and benefits and she is agreeable to proceed. Further workup may be needed for possible underlying chronic liver disease. Await liver biopsy.    Debroah Dagmar Adcox  12/14/2016, 8:32 AM

## 2016-12-14 NOTE — H&P (View-Only) (Signed)
12/14/2016   [address] [place], [place] [address] Phone: [phone]  FAX: [phone]      Andrea Blackwell [id number] 27-Dec-1955  CARE TEAM:  PCP: Patient, No Pcp Per  Outpatient Care Team: Patient Care Team: Patient, No Pcp Per as PCP - General (General Practice)  Inpatient Treatment Team: Treatment Team: Attending Provider: Jonetta Osgood, MD; Consulting Physician: Edison Pace, Md, MD; Technician: Darrick Grinder, NT; Technician: Lucila Maine, NT; Consulting Physician: Carol Ada, MD; Registered Nurse: Gertie Fey, RN; Technician: Demaris Callander, NT; Registered Nurse: Tacey Ruiz, RN; Case Manager: Dessa Phi, RN; Consulting Physician: Milus Banister, MD; Rounding Team: Redmond Baseman, MD; Registered Nurse: Leonie Douglas, RN   Problem List:   Principal Problem:   Gallstone pancreatitis Active Problems:   Acute calculous cholecystitis   Hypothyroidism   Jaundice   Obesity    12/10/2016 Procedure:  ENDOSCOPIC RETROGRADE CHOLANGIOPANCREATOGRAPHY (ERCP)  12/13/2016   POST-OPERATIVE DIAGNOSIS:   Acute on Chronic Calculus Cholecystitis Gallstone pancreatitis Pinhole common bile duct leak. Liver: Probable cholestasis.  Possible steatohepatitis  PROCEDURE:    Laparoscopic cholecystectomy w IOC Partial CBD primary repair Core Liver Biopsy  SURGEON:  [name], MD, FACS.      Assessment/Plan Acute gallstone pancreatitis 2/2 choledocholithiasis   S/p ERCP w/ sphincterotomy 11/3 Dr. Benson Norway  Status post laparocopic cholecystectomy with cholangiogram.  Repair of common bile duct with persistent small leak.  Core liver biopsy  I updated the patient's status to the patient and nurse.  Patient has small pinhole common bile duct leak of uncertain etiology.  Refractory to primary repair given poor tissues.  Plan is for gastrology to repeat ERCP with stent placement.  Bilateral  internal/external drainage to the left this small area heal up.  Her bilirubin is stable.  Her drain is serosanguineous at this point.  She did have clear liquids but is been n.p.o. for an hour and a half.  Recommendations were made.  Questions were answered.  They expressed understanding & appreciation.   Heartburn/GERD: Protonix to p.o. FEN: NPO, IVF  ID: ciprofloxacin  VTE: SCD's    30 minutes spent in review, evaluation, examination, counseling, and coordination of care.  More than 50% of that time was spent in counseling.  [name], M.D., F.A.C.S. Gastrointestinal and Minimally Invasive Surgery [facility] [address], [place], [place] [address] [phone] Main / Paging   12/14/2016    Subjective: (Chief complaint)  Sore at bellybutton but ice pack helps ess sore. Nurse at bedside   Objective:  Vital signs:  Vitals:   12/13/16 2108 12/14/16 0205 12/14/16 0437 12/14/16 0500  BP: (!) 142/68 139/65 134/68   Pulse: 68 62 (!) 57   Resp: _0 Temp: 98.4 F (36.9 C) 97.6 F (36.4 C) 97.8 F (36.6 C)   TempSrc: Oral Oral Oral   SpO2: 96% 98% 97%   Weight:    92.1 kg (203 lb 0.7 oz)  Height:        Last BM Date: 12/11/16  Intake/Output   Yesterday:  11/06 0701 - 11/07 0700 In: 3343.3 [P.O.:240; I.V.:2503.3; IV Piggyback:600] Out: 1490 [Urine:1350; Drains:40; Blood:100] This shift:  No intake/output data recorded.  Bowel function:  Flatus: YES  BM:  YES  Drain: Serosanguinous   Physical Exam:  General: Pt awake/alert/oriented x4 in no acute distress Eyes: PERRL, normal EOM.  Sclera clear.  +  icterus Neuro: CN II-XII intact w/o focal sensory/motor deficits. Lymph: No head/neck/groin lymphadenopathy Psych:  No delerium/psychosis/paranoia HENT: Normocephalic, Mucus membranes moist.  No thrush Neck: Supple, No tracheal deviation Chest: No chest wall pain w good excursion CV:  Pulses intact.  Regular  rhythm MS: Normal AROM mjr joints.  No obvious deformity  Abdomen: Soft.  Nondistended.  Mildly tender at incisions only.  No evidence of peritonitis.  No incarcerated hernias.  Ext:   No deformity.  No mjr edema.  No cyanosis Skin: No petechiae / purpura.  Very jaundiced  Results:   Labs: Results for orders placed or performed during the hospital encounter of 12/09/16 (from the past 29 hour(s))  Surgical pcr screen     Status: Abnormal   Collection Time: 12/12/16 10:32 AM  Result Value Ref Range   MRSA, PCR POSITIVE (A) NEGATIVE    Comment: RESULT CALLED TO, READ BACK BY AND VERIFIED WITH: ORAEGBUNAN,I. RN _0  ON 11.05.18 BY COHEN,K    Staphylococcus aureus POSITIVE (A) NEGATIVE    Comment: (NOTE) The Xpert SA Assay (FDA approved for NASAL specimens in patients 49 years of age and older), is one component of a comprehensive surveillance program. It is not intended to diagnose infection nor to guide or monitor treatment.   Comprehensive metabolic panel     Status: Abnormal   Collection Time: 12/13/16  5:32 AM  Result Value Ref Range   Sodium 140 135 - 145 mmol/L   Potassium 4.2 3.5 - 5.1 mmol/L   Chloride 101 101 - 111 mmol/L   CO2 28 22 - 32 mmol/L   Glucose, Bld 111 (H) 65 - 99 mg/dL   BUN 6 6 - 20 mg/dL   Creatinine, Ser 0.63 0.44 - 1.00 mg/dL   Calcium 9.3 8.9 - 10.3 mg/dL   Total Protein 6.6 6.5 - 8.1 g/dL   Albumin 3.2 (L) 3.5 - 5.0 g/dL   AST 295 (H) 15 - 41 U/L   ALT 505 (H) 14 - 54 U/L   Alkaline Phosphatase 269 (H) 38 - 126 U/L   Total Bilirubin 10.1 (H) 0.3 - 1.2 mg/dL   GFR calc non Af Amer >60 >60 mL/min   GFR calc Af Amer >60 >60 mL/min    Comment: (NOTE) The eGFR has been calculated using the CKD EPI equation. This calculation has not been validated in all clinical situations. eGFR's persistently <60 mL/min signify possible Chronic Kidney Disease.    Anion gap 11 5 - 15  Lipase, blood     Status: None   Collection Time: 12/13/16  5:32 AM  Result  Value Ref Range   Lipase 43 11 - 51 U/L  Ammonia     Status: None   Collection Time: 12/13/16 11:45 AM  Result Value Ref Range   Ammonia 24 9 - 35 umol/L  Comprehensive metabolic panel     Status: Abnormal   Collection Time: 12/14/16  5:30 AM  Result Value Ref Range   Sodium 137 135 - 145 mmol/L   Potassium 4.2 3.5 - 5.1 mmol/L   Chloride 101 101 - 111 mmol/L   CO2 26 22 - 32 mmol/L   Glucose, Bld 190 (H) 65 - 99 mg/dL   BUN 8 6 - 20 mg/dL   Creatinine, Ser 0.64 0.44 - 1.00 mg/dL   Calcium 8.9 8.9 - 10.3 mg/dL   Total Protein 6.2 (L) 6.5 - 8.1 g/dL   Albumin 2.7 (L) 3.5 - 5.0 g/dL   AST 408 (H)  15 - 41 U/L   ALT 585 (H) 14 - 54 U/L   Alkaline Phosphatase 247 (H) 38 - 126 U/L   Total Bilirubin 9.8 (H) 0.3 - 1.2 mg/dL   GFR calc non Af Amer >60 >60 mL/min   GFR calc Af Amer >60 >60 mL/min    Comment: (NOTE) The eGFR has been calculated using the CKD EPI equation. This calculation has not been validated in all clinical situations. eGFR's persistently <60 mL/min signify possible Chronic Kidney Disease.    Anion gap 10 5 - 15  Lipase, blood     Status: None   Collection Time: 12/14/16  5:30 AM  Result Value Ref Range   Lipase 29 11 - 51 U/L  CBC     Status: None   Collection Time: 12/14/16  5:30 AM  Result Value Ref Range   WBC 6.5 4.0 - 10.5 K/uL   RBC 4.17 3.87 - 5.11 MIL/uL   Hemoglobin 12.6 12.0 - 15.0 g/dL   HCT 38.4 36.0 - 46.0 %   MCV 92.1 78.0 - 100.0 fL   MCH 30.2 26.0 - 34.0 pg   MCHC 32.8 30.0 - 36.0 g/dL   RDW 14.0 11.5 - 15.5 %   Platelets 252 150 - 400 K/uL    Imaging / Studies: Dg Cholangiogram Operative  Result Date: 12/14/2016 CLINICAL DATA:  61 year old female with a history of cholelithiasis EXAM: INTRAOPERATIVE CHOLANGIOGRAM TECHNIQUE: Cholangiographic images from the C-arm fluoroscopic device were submitted for interpretation post-operatively. Please see the procedural report for the amount of contrast and the fluoroscopy time utilized. COMPARISON:   CT 12/10/2016, ERCP 12/10/2016 FINDINGS: Surgical instruments project over the upper abdomen. There is attempted cannulation of the cystic duct/gallbladder neck, with small amount of extraluminal contrast within the abdomen. No duct opacification on the limited images. . IMPRESSION: Limited images of intraoperative cholangiogram demonstrates small amount extraluminal contrast. Please refer to the dictated operative report for full details of intraoperative findings and procedure. Electronically Signed   By: Corrie Mckusick D.O.   On: 12/14/2016 07:20    Medications / Allergies: per chart  Antibiotics: Anti-infectives (From admission, onward)   Start     Dose/Rate Route Frequency Ordered Stop   12/13/16 1722  ciprofloxacin (CIPRO) 400 MG/200ML IVPB    Comments:  Enrigue Catena   : cabinet override      12/13/16 1722 12/13/16 1732   12/13/16 1630  ciprofloxacin (CIPRO) IVPB 400 mg  Status:  Discontinued    Comments:  Pharmacy may adjust dosing strength, interval, or rate of medication as needed for optimal therapy for the patient Send with patient on call to the OR.  Anesthesia to complete antibiotic administration <47mn prior to incision per [facility]   400 mg 200 mL/hr over 60 Minutes Intravenous On call to O.R. 12/13/16 1127 12/13/16 2047   12/13/16 1630  metroNIDAZOLE (FLAGYL) IVPB 500 mg    Comments:  Pharmacy may adjust dosing strength, interval, or rate of medication as needed for optimal therapy for the patient Send with patient on call to the OR.  Anesthesia to complete antibiotic administration <69m prior to [facility]  500 mg 100 mL/hr over 60 Minutes Intravenous On call to O.R. 12/13/16 1127 12/13/16 1733   12/12/16 0715  clindamycin (CLEOCIN) IVPB 900 mg     900 mg 100 mL/hr over 30 Minutes Intravenous On call to O.R. 12/12/16 [id number]/06/18 0559   12/12/16 0715  gentamicin (GARAMYCIN) 370 mg in dextrose 5 %  100 mL IVPB     5 mg/kg  74.1 kg (Adjusted) 109.3  mL/hr over 60 Minutes Intravenous On call to O.R. 12/12/16 0712 12/13/16 0559   12/10/16 1300  ciprofloxacin (CIPRO) IVPB 400 mg     400 mg 200 mL/hr over 60 Minutes Intravenous 2 times daily 12/10/16 1217          Note: Portions of this report may have been transcribed using voice recognition software. Every effort was made to ensure accuracy; however, inadvertent computerized transcription errors may be present.   Any transcriptional errors that result from this process are unintentional.     [name], M.D., F.A.C.S. Gastrointestinal and Minimally Invasive Surgery [facility] [address], [place], [place] [address] [phone] Main / Paging   12/14/2016

## 2016-12-14 NOTE — Interval H&P Note (Signed)
History and Physical Interval Note:  12/14/2016 1:20 PM  Andrea Blackwell  has presented today for surgery, with the diagnosis of bile duct leak  The various methods of treatment have been discussed with the patient and family. After consideration of risks, benefits and other options for treatment, the patient has consented to  Procedure(s): ENDOSCOPIC RETROGRADE CHOLANGIOPANCREATOGRAPHY (ERCP) (N/A) as a surgical intervention .  The patient's history has been reviewed, patient examined, no change in status, stable for surgery.  I have reviewed the patient's chart and labs.  Questions were answered to the patient's satisfaction.     Milus Banister

## 2016-12-15 ENCOUNTER — Telehealth: Payer: Self-pay

## 2016-12-15 DIAGNOSIS — R945 Abnormal results of liver function studies: Secondary | ICD-10-CM

## 2016-12-15 DIAGNOSIS — E038 Other specified hypothyroidism: Secondary | ICD-10-CM

## 2016-12-15 LAB — CBC
HCT: 34.9 % — ABNORMAL LOW (ref 36.0–46.0)
Hemoglobin: 11.6 g/dL — ABNORMAL LOW (ref 12.0–15.0)
MCH: 30.3 pg (ref 26.0–34.0)
MCHC: 33.2 g/dL (ref 30.0–36.0)
MCV: 91.1 fL (ref 78.0–100.0)
PLATELETS: 220 10*3/uL (ref 150–400)
RBC: 3.83 MIL/uL — ABNORMAL LOW (ref 3.87–5.11)
RDW: 14.2 % (ref 11.5–15.5)
WBC: 7 10*3/uL (ref 4.0–10.5)

## 2016-12-15 LAB — BASIC METABOLIC PANEL
Anion gap: 5 (ref 5–15)
BUN: 7 mg/dL (ref 6–20)
CO2: 28 mmol/L (ref 22–32)
CREATININE: 0.49 mg/dL (ref 0.44–1.00)
Calcium: 8.9 mg/dL (ref 8.9–10.3)
Chloride: 108 mmol/L (ref 101–111)
GFR calc Af Amer: >60 mL/min (ref >60)
GLUCOSE: 137 mg/dL — AB (ref 65–99)
POTASSIUM: 3.9 mmol/L (ref 3.5–5.1)
SODIUM: 141 mmol/L (ref 135–145)

## 2016-12-15 LAB — HEPATIC FUNCTION PANEL
ALT: 447 U/L — AB (ref 14–54)
AST: 182 U/L — ABNORMAL HIGH (ref 15–41)
Albumin: 2.7 g/dL — ABNORMAL LOW (ref 3.5–5.0)
Alkaline Phosphatase: 213 U/L — ABNORMAL HIGH (ref 38–126)
BILIRUBIN DIRECT: 3.6 mg/dL — AB (ref 0.1–0.5)
BILIRUBIN INDIRECT: 2.8 mg/dL — AB (ref 0.3–0.9)
TOTAL PROTEIN: 5.6 g/dL — AB (ref 6.5–8.1)
Total Bilirubin: 6.4 mg/dL — ABNORMAL HIGH (ref 0.3–1.2)

## 2016-12-15 NOTE — Progress Notes (Signed)
[facility] Progress Note  1 Day Post-Op  Subjective: CC: mild abdominal pain Patient with some mild abdominal pain around drain site, controlled with medication. Denies n/v. Tolerated FLD. Answered questions about post-op care. UOP good. VSS.   Objective: Vital signs in last 24 hours: Temp:  [97.6 F (36.4 C)-98.7 F (37.1 C)] 97.6 F (36.4 C) (11/08 0529) Pulse Rate:  [60-82] 67 (11/08 0529) Resp:  [13-18] 18 (11/08 0529) BP: (113-150)/(42-62) 135/51 (11/08 0529) SpO2:  [94 %-99 %] 97 % (11/08 0529) Weight:  [92.1 kg (203 lb)-92.9 kg (204 lb 12.9 oz)] 92.9 kg (204 lb 12.9 oz) (11/08 0529) Last BM Date: 12/11/16  Intake/Output from previous day: 11/07 0701 - 11/08 0700 In: 3364.2 [P.O.:840; I.V.:2124.2; IV Piggyback:400] Out: 3070 [Urine:2950; Drains:120] Intake/Output this shift: No intake/output data recorded.  PE: Gen:  Alert, NAD, pleasant Eyes: scleral icterus Card:  Regular rate and rhythm Pulm:  Normal effort, clear to auscultation bilaterally Abd: Soft, appropriately tender, non-distended, bowel sounds present in all 4 quadrants, no HSM, incisions C/D/I, drain with small amount sanguinous output Skin: warm and dry, no rashes; jaundiced Psych: A&Ox3   Lab Results:  Recent Labs    12/14/16 0530 12/15/16 0615  WBC 6.5 7.0  HGB 12.6 11.6*  HCT 38.4 34.9*  PLT 252 220   BMET Recent Labs    12/14/16 0530 12/15/16 0615  NA 137 141  K 4.2 3.9  CL 101 108  CO2 26 28  GLUCOSE 190* 137*  BUN 8 7  CREATININE 0.64 0.49  CALCIUM 8.9 8.9   PT/INR Recent Labs    12/14/16 1147  LABPROT 13.6  INR 1.05   CMP     Component Value Date/Time   NA 141 12/15/2016 0615   K 3.9 12/15/2016 0615   CL 108 12/15/2016 0615   CO2 28 12/15/2016 0615   GLUCOSE 137 (H) 12/15/2016 0615   BUN 7 12/15/2016 0615   CREATININE 0.49 12/15/2016 0615   CALCIUM 8.9 12/15/2016 0615   PROT 5.6 (L) 12/15/2016 0615   ALBUMIN 2.7 (L) 12/15/2016 0615   AST 182 (H)  12/15/2016 0615   ALT 447 (H) 12/15/2016 0615   ALKPHOS 213 (H) 12/15/2016 0615   BILITOT 6.4 (H) 12/15/2016 0615   GFRNONAA >60 12/15/2016 0615   GFRAA >60 12/15/2016 0615   Lipase     Component Value Date/Time   LIPASE 29 12/14/2016 0530       Studies/Results: Dg Cholangiogram Operative  Result Date: 12/14/2016 CLINICAL DATA:  61 year old female with a history of cholelithiasis EXAM: INTRAOPERATIVE CHOLANGIOGRAM TECHNIQUE: Cholangiographic images from the C-arm fluoroscopic device were submitted for interpretation post-operatively. Please see the procedural report for the amount of contrast and the fluoroscopy time utilized. COMPARISON:  CT 12/10/2016, ERCP 12/10/2016 FINDINGS: Surgical instruments project over the upper abdomen. There is attempted cannulation of the cystic duct/gallbladder neck, with small amount of extraluminal contrast within the abdomen. No duct opacification on the limited images. . IMPRESSION: Limited images of intraoperative cholangiogram demonstrates small amount extraluminal contrast. Please refer to the dictated operative report for full details of intraoperative findings and procedure. Electronically Signed   By: Corrie Mckusick D.O.   On: 12/14/2016 07:20   Dg Ercp  Result Date: 12/14/2016 CLINICAL DATA:  61 year old female with a biliary leak. EXAM: ERCP with stent placement TECHNIQUE: Multiple spot images obtained with the fluoroscopic device and submitted for interpretation post-procedure. FLUOROSCOPY TIME:  Fluoroscopy Time:  0 minutes 33 seconds reported COMPARISON:  Prior  year CP images 11 07/27/2016 and 12/10/2016 FINDINGS: Two intraoperative saved images an a cine clip are submitted for review. The images demonstrate a flexible endoscope in the descending duodenum with cannulation of the common bile duct. Cholangiogram is performed confirming successful cannulation of the intrahepatic ducts. On the final image, a plastic biliary stent has been placed.  IMPRESSION: ERCP with plastic biliary stent placement as above. These images were submitted for radiologic interpretation only. Please see the procedural report for the amount of contrast and the fluoroscopy time utilized. Electronically Signed   By: Jacqulynn Cadet M.D.   On: 12/14/2016 15:32    Anti-infectives: Anti-infectives (From admission, onward)   Start     Dose/Rate Route Frequency Ordered Stop   12/13/16 1722  ciprofloxacin (CIPRO) 400 MG/200ML IVPB    Comments:  Enrigue Catena   : cabinet override      12/13/16 1722 12/13/16 1732   12/13/16 1630  ciprofloxacin (CIPRO) IVPB 400 mg  Status:  Discontinued    Comments:  Pharmacy may adjust dosing strength, interval, or rate of medication as needed for optimal therapy for the patient Send with patient on call to the OR.  Anesthesia to complete antibiotic administration <77min prior to incision per [facility].   400 mg 200 mL/hr over 60 Minutes Intravenous On call to O.R. 12/13/16 1127 12/13/16 2047   12/13/16 1630  metroNIDAZOLE (FLAGYL) IVPB 500 mg    Comments:  Pharmacy may adjust dosing strength, interval, or rate of medication as needed for optimal therapy for the patient Send with patient on call to the OR.  Anesthesia to complete antibiotic administration <46min prior to incision per [facility].   500 mg 100 mL/hr over 60 Minutes Intravenous On call to O.R. 12/13/16 1127 12/13/16 1733   12/12/16 0715  clindamycin (CLEOCIN) IVPB 900 mg     900 mg 100 mL/hr over 30 Minutes Intravenous On call to O.R. 12/12/16 0712 12/13/16 0559   12/12/16 0715  gentamicin (GARAMYCIN) 370 mg in dextrose 5 % 100 mL IVPB     5 mg/kg  74.1 kg (Adjusted) 109.3 mL/hr over 60 Minutes Intravenous On call to O.R. 12/12/16 0712 12/13/16 0559   12/10/16 1300  ciprofloxacin (CIPRO) IVPB 400 mg  Status:  Discontinued     400 mg 200 mL/hr over 60 Minutes Intravenous 2 times daily 12/10/16 1217 12/15/16 0801       Assessment/Plan Acute gallstone  pancreatitis 2/2 choledocholithiasis  S/p ERCP w/ sphincterotomy 11/3 Dr. Benson Norway  S/P laparocopic cholecystectomy + IOC.  Repair CBD w/ persistent small            leak.  Core liver biopsy 12/13/16 Dr. Johney Maine S/P ERCP with stent placement 12/14/16 Dr. Ardis Hughs - no bile leak seen  - POD#2 - liver bx path pending - LFTs and Tbili trending down - continue to follow  - drain with 120 cc output 24h, sanguinous - continue drain - advance diet to [facility] - likely discharge tomorrow   Heartburn/GERD: Protonix to p.o. FEN: FLD  ID: ciprofloxacin  VTE: SCD's Follow up: Dr Ardis Hughs 4-6 weeks, Dr. Johney Maine    LOS: 5 days    Brigid Re , [facility] 12/15/2016, 9:47 AM Pager: [phone] Consults: [phone] Mon-Fri 7:00 am-4:30 pm Sat-Sun 7:00 am-11:30 am

## 2016-12-15 NOTE — Progress Notes (Signed)
Patient ID: Andrea Blackwell, female   DOB: 1955/06/03, 61 y.o.   MRN: [id number]    Progress Note   Subjective   Feeling better - getting ready to walk in hall  Pain at drain site - hurts to cough etc, tolerating po's   Objective   Vital signs in last 24 hours: Temp:  [97.6 F (36.4 C)-98.7 F (37.1 C)] 97.6 F (36.4 C) (11/08 0529) Pulse Rate:  [60-82] 67 (11/08 0529) Resp:  [13-18] 18 (11/08 0529) BP: (113-150)/(42-62) 135/51 (11/08 0529) SpO2:  [94 %-99 %] 97 % (11/08 0529) Weight:  [203 lb (92.1 kg)-204 lb 12.9 oz (92.9 kg)] 204 lb 12.9 oz (92.9 kg) (11/08 0529) Last BM Date: 12/11/16 General:    white female in NAD, less jaundiced Heart:  Regular rate and rhythm; no murmurs Lungs: Respirations even and unlabored, lungs CTA bilaterally Abdomen:  Soft, tender  Right abdomen , Jp  Draining ,and nondistended. Normal bowel sounds. Extremities:  Without edema. Neurologic:  Alert and oriented,  grossly normal neurologically. Psych:  Cooperative. Normal mood and affect.  Intake/Output from previous day: 11/07 0701 - 11/08 0700 In: 3364.2 [P.O.:840; I.V.:2124.2; IV Piggyback:400] Out: 3070 [Urine:2950; Drains:120] Intake/Output this shift: No intake/output data recorded.  Lab Results: Recent Labs    12/14/16 0530 12/15/16 0615  WBC 6.5 7.0  HGB 12.6 11.6*  HCT 38.4 34.9*  PLT 252 220   BMET Recent Labs    12/13/16 0532 12/14/16 0530 12/15/16 0615  NA 140 137 141  K 4.2 4.2 3.9  CL 101 101 108  CO2 28 26 28   GLUCOSE 111* 190* 137*  BUN 6 8 7   CREATININE 0.63 0.64 0.49  CALCIUM 9.3 8.9 8.9   LFT Recent Labs    12/15/16 0615  PROT 5.6*  ALBUMIN 2.7*  AST 182*  ALT 447*  ALKPHOS 213*  BILITOT 6.4*  BILIDIR 3.6*  IBILI 2.8*   PT/INR Recent Labs    12/14/16 1147  LABPROT 13.6  INR 1.05    Studies/Results: Dg Cholangiogram Operative  Result Date: 12/14/2016 CLINICAL DATA:  61 year old female with a history of cholelithiasis EXAM: INTRAOPERATIVE  CHOLANGIOGRAM TECHNIQUE: Cholangiographic images from the C-arm fluoroscopic device were submitted for interpretation post-operatively. Please see the procedural report for the amount of contrast and the fluoroscopy time utilized. COMPARISON:  CT 12/10/2016, ERCP 12/10/2016 FINDINGS: Surgical instruments project over the upper abdomen. There is attempted cannulation of the cystic duct/gallbladder neck, with small amount of extraluminal contrast within the abdomen. No duct opacification on the limited images. . IMPRESSION: Limited images of intraoperative cholangiogram demonstrates small amount extraluminal contrast. Please refer to the dictated operative report for full details of intraoperative findings and procedure. Electronically Signed   By: Corrie Mckusick D.O.   On: 12/14/2016 07:20   Dg Ercp  Result Date: 12/14/2016 CLINICAL DATA:  61 year old female with a biliary leak. EXAM: ERCP with stent placement TECHNIQUE: Multiple spot images obtained with the fluoroscopic device and submitted for interpretation post-procedure. FLUOROSCOPY TIME:  Fluoroscopy Time:  0 minutes 33 seconds reported COMPARISON:  Prior year CP images 11 07/27/2016 and 12/10/2016 FINDINGS: Two intraoperative saved images an a cine clip are submitted for review. The images demonstrate a flexible endoscope in the descending duodenum with cannulation of the common bile duct. Cholangiogram is performed confirming successful cannulation of the intrahepatic ducts. On the final image, a plastic biliary stent has been placed. IMPRESSION: ERCP with plastic biliary stent placement as above. These images were submitted for radiologic interpretation  only. Please see the procedural report for the amount of contrast and the fluoroscopy time utilized. Electronically Signed   By: Jacqulynn Cadet M.D.   On: 12/14/2016 15:32       Assessment / Plan:     #1 61 yo WF with acute gallstone pancreatitis s/p ERCp and stone extraction 11/,3 s/p lap chole  , repair of CBD leak and placement of JP 11/6  S/P ERCP and placement of stent yesterday for persistent mild bile leak - no clear evidence for continue leak at time of ERCP yesterday   #2 persistently elevated LFT's - abnormal appearing liver at surgery - liver bx was done - path pending  LFT's are trending down  Plan; Follow up Liver BX Pt will be scheduled  for  Biliary stent removal with Dr Ardis Hughs  In 4-6 weeks- office will arrange  Gi will sign off , please call if can help       Contact  Andrea Blackwell, P.A.-C               [phone]      Principal Problem:   Gallstone pancreatitis Active Problems:   Acute calculous cholecystitis   Hypothyroidism   Jaundice   Obesity   Bile duct leak     LOS: 5 days   Andrea Blackwell  12/15/2016, 9:09 AM

## 2016-12-15 NOTE — Evaluation (Signed)
Physical Therapy Evaluation Patient Details Name: Andrea Blackwell MRN: [id number] DOB: 04-07-1955 Today's Date: 12/15/2016   History of Present Illness  Pt is a 61 year old female admitted with acute gallstone pancreatitis 2/2 choledocholithiasis    Clinical Impression  Pt admitted with above diagnosis. Pt currently with functional limitations due to the deficits listed below (see PT Problem List).  Pt will benefit from skilled PT to increase their independence and safety with mobility to allow discharge to the venue listed below.  Pt slow to mobilize (appears fearful of pain) however tolerated good distance of ambulation with RW.  Pt reports d/c plan is for home with her friend (who has a flight of steps).  Pt encouraged to ambulate with nursing staff during acute stay.     Follow Up Recommendations Home health PT    Equipment Recommendations  Rolling walker with 5" wheels    Recommendations for Other Services       Precautions / Restrictions Precautions Precaution Comments: R JP drain      Mobility  Bed Mobility Overal bed mobility: Needs Assistance Bed Mobility: Supine to Sit     Supine to sit: Min assist;HOB elevated     General bed mobility comments: slight assist for trunk upright  Transfers Overall transfer level: Needs assistance Equipment used: Rolling walker (2 wheeled) Transfers: Sit to/from Stand Sit to Stand: Min guard         General transfer comment: verbal cues for hand placement  Ambulation/Gait Ambulation/Gait assistance: Min guard Ambulation Distance (Feet): 200 Feet Assistive device: Rolling walker (2 wheeled) Gait Pattern/deviations: Step-through pattern;Decreased stride length     General Gait Details: verbal cues for RW positioning, very slow pace  Stairs            Wheelchair Mobility    Modified Rankin (Stroke Patients Only)       Balance                                             Pertinent Vitals/Pain  Pain Assessment: 0-10 Pain Score: 4  Pain Location: surgical site Pain Descriptors / Indicators: Sore Pain Intervention(s): Limited activity within patient's tolerance;Repositioned    Home Living Family/patient expects to be discharged to:: Private residence Living Arrangements: Alone Available Help at Discharge: Friend(s) Type of Home: House       Home Layout: Two level Home Equipment: None Additional Comments: pt plans to d/c home with her friend, has flight of steps as above    Prior Function Level of Independence: Independent               Hand Dominance        Extremity/Trunk Assessment        Lower Extremity Assessment Lower Extremity Assessment: Generalized weakness       Communication   Communication: No difficulties  Cognition Arousal/Alertness: Awake/alert Behavior During Therapy: WFL for tasks assessed/performed Overall Cognitive Status: Within Functional Limits for tasks assessed                                        General Comments      Exercises     Assessment/Plan    PT Assessment Patient needs continued PT services  PT Problem List Decreased strength;Decreased activity tolerance;Decreased mobility;Pain;Decreased knowledge of use  of DME       PT Treatment Interventions DME instruction;Therapeutic activities;Therapeutic exercise;Gait training;Patient/family education;Stair training;Functional mobility training    PT Goals (Current goals can be found in the Care Plan section)  Acute Rehab PT Goals PT Goal Formulation: With patient Time For Goal Achievement: 12/29/16 Potential to Achieve Goals: Good    Frequency Min 3X/week   Barriers to discharge        Co-evaluation               AM-PAC PT "6 Clicks" Daily Activity  Outcome Measure Difficulty turning over in bed (including adjusting bedclothes, sheets and blankets)?: None Difficulty moving from lying on back to sitting on the side of the bed? :  Unable Difficulty sitting down on and standing up from a chair with arms (e.g., wheelchair, bedside commode, etc,.)?: Unable Help needed moving to and from a bed to chair (including a wheelchair)?: A Little Help needed walking in hospital room?: A Little Help needed climbing 3-5 steps with a railing? : A Little 6 Click Score: 15    End of Session   Activity Tolerance: Patient tolerated treatment well Patient left: in chair;with call bell/phone within reach Nurse Communication: Mobility status PT Visit Diagnosis: Difficulty in walking, not elsewhere classified (R26.2)    Time: 1740-8144 PT Time Calculation (min) (ACUTE ONLY): 41 min   Charges:   PT Evaluation $PT Eval Low Complexity: 1 Low PT Treatments $Gait Training: 8-22 mins   PT G Codes:        Carmelia Bake, PT, DPT 12/15/2016 Pager: [phone]   York Ram E 12/15/2016, 12:20 PM

## 2016-12-15 NOTE — Progress Notes (Signed)
PROGRESS NOTE        PATIENT DETAILS Name: Andrea Blackwell Age: 61 y.o. Sex: female Date of Birth: [birth year] Admit Date: 12/09/2016 Admitting Physician Jani Gravel, MD VHQ:[id number], No Pcp Per  Brief Narrative: Patient is a 61 y.o. female with past medical history of hypothyroidism admitted with abdominal pain, vomiting, she was found to have acute gallstone pancreatitis with choledocholithiasis. She was provided with supportive care, she was seen by GI and general surgery. She underwent ERCP on 11/3 with removal of a CBD stone and a sphincterotomy, subsequently general surgery was consulted, patient then underwent a laparoscopic cholecystectomy on 11/6, she was found to have a small bile leak intraoperatively. GI has been reconsulted, for ERCP and possible biliary stenting. See below for further details.   Subjective: Lot better today-less jaundice.  Assessment/Plan: Gallstone pancreatitis: Much improved, LFTs are now downtrending after biliary stent placement on 11/7. Diet being advanced, no abdominal pain.   Acute on chronic calculus cholecystitis with choledocholithiasis and obstructive jaundice: Admitted and provided supportive care, started on empiric antimicrobial therapy. Subsequently seen by both GI and general surgery. Underwent  ERCP on 11/3 with CBD stone removal and sphincterotomy, subsequently underwent laparoscopic cholecystectomy where she was found to have a bile leak intraoperatively. GI was reconsulted, patient subsequently underwent a repeat ERCP on 11/7, and a biliary stent was placed. LFTs have started to decline, continue with supportive care, spoke with general surgery-okay to discontinue ciprofloxacin. If clinical improvement continues, I suspect she could be discharged home on 11/9. Per general surgery, plans are to discharge patient with the external drain-general surgery will remove drain in 1 week upon follow-up.  One episode of hematemesis: This  occurred right before she presented to the hospital, no further episodes since then. Supportive care-doubt further workup is required at this point. 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  DVT Prophylaxis: SCD's  Code Status: Full code   Family Communication: None at bedside  Disposition Plan: Remain hopefully home on 11/9   Antimicrobial agents: Anti-infectives (From admission, onward)   Start     Dose/Rate Route Frequency Ordered Stop   12/13/16 1722  ciprofloxacin (CIPRO) 400 MG/200ML IVPB    Comments:  Enrigue Catena   : cabinet override      12/13/16 1722 12/13/16 1732   12/13/16 1630  ciprofloxacin (CIPRO) IVPB 400 mg  Status:  Discontinued    Comments:  Pharmacy may adjust dosing strength, interval, or rate of medication as needed for optimal therapy for the patient Send with patient on call to the OR.  Anesthesia to complete antibiotic administration <88min prior to incision per [facility].   400 mg 200 mL/hr over 60 Minutes Intravenous On call to O.R. 12/13/16 1127 12/13/16 2047   12/13/16 1630  metroNIDAZOLE (FLAGYL) IVPB 500 mg    Comments:  Pharmacy may adjust dosing strength, interval, or rate of medication as needed for optimal therapy for the patient Send with patient on call to the OR.  Anesthesia to complete antibiotic administration <7min prior to incision per [facility].   500 mg 100 mL/hr over 60 Minutes Intravenous On call to O.R. 12/13/16 1127 12/13/16 1733   12/12/16 0715  clindamycin (  CLEOCIN) IVPB 900 mg     900 mg 100 mL/hr over 30 Minutes Intravenous On call to O.R. 12/12/16 3818 12/13/16 0559   12/12/16 0715  gentamicin (GARAMYCIN) 370 mg in dextrose 5 % 100 mL IVPB     5 mg/kg  74.1 kg (Adjusted) 109.3 mL/hr over 60  Minutes Intravenous On call to O.R. 12/12/16 0712 12/13/16 0559   12/10/16 1300  ciprofloxacin (CIPRO) IVPB 400 mg  Status:  Discontinued     400 mg 200 mL/hr over 60 Minutes Intravenous 2 times daily 12/10/16 1217 12/15/16 0801     Procedures: 11/3>>ERCP 11/6>Lap cholecystectomy 11/7>> ERCP and biliary stent placement  CONSULTS:  GI and general surgery  Time spent: 25- minutes-Greater than 50% of this time was spent in counseling, explanation of diagnosis, planning of further management, and coordination of care.  MEDICATIONS: Scheduled Meds: . Chlorhexidine Gluconate Cloth  6 each Topical Once  . Chlorhexidine Gluconate Cloth  6 each Topical Q0600  . escitalopram  10 mg Oral QHS  . gabapentin  300 mg Oral QHS  . levothyroxine  75 mcg Oral QAC breakfast  . lip balm  1 application Topical BID  . mupirocin ointment  1 application Nasal BID  . pantoprazole  40 mg Oral Q1200  . psyllium  1 packet Oral Daily  . saccharomyces boulardii  250 mg Oral BID   Continuous Infusions: . lactated ringers    . lactated ringers 50 mL/hr at 12/15/16 0326  . methocarbamol (ROBAXIN)  IV     PRN Meds:.alum & mag hydroxide-simeth, bisacodyl, bismuth subsalicylate, diphenhydrAMINE, diphenhydrAMINE, guaiFENesin-dextromethorphan, hydrALAZINE, hydrocortisone, hydrocortisone cream, HYDROmorphone (DILAUDID) injection, lactated ringers, magic mouthwash, menthol-cetylpyridinium, methocarbamol (ROBAXIN)  IV, metoCLOPramide (REGLAN) injection, metoprolol tartrate, ondansetron (ZOFRAN) IV, oxyCODONE, phenol, prochlorperazine   PHYSICAL EXAM: Vital signs: Vitals:   12/14/16 1500 12/14/16 1535 12/14/16 2141 12/15/16 0529  BP: (!) 137/53 (!) 132/55 (!) 146/62 (!) 135/51  Pulse: 72 67 60 67  Resp: 15 17 16 18   Temp:  98.7 F (37.1 C) 98.2 F (36.8 C) 97.6 F (36.4 C)  TempSrc:  Oral Oral Oral  SpO2: 94% 95% 94% 97%  Weight:    92.9 kg (204 lb 12.9 oz)  Height:       Filed Weights   12/14/16 0500  12/14/16 1300 12/15/16 0529  Weight: 92.1 kg (203 lb 0.7 oz) 92.1 kg (203 lb) 92.9 kg (204 lb 12.9 oz)   Body mass index is 32.56 kg/m.   General appearance :Awake, alert, not in any distress.  Eyes:, pupils equally reactive to light and accomodation,no scleral icterus. HEENT: Atraumatic and Normocephalic Neck: supple, no JVD. Resp:Good air entry bilaterally, no rales or rhonchi CVS: S1 S2 regular, no murmurs.  GI: Bowel sounds present, Non tender and not distended with no gaurding, rigidity or rebound. Drain in place. Extremities: B/L Lower Ext shows no edema, both legs are warm to touch Neurology:  speech clear,Non focal, sensation is grossly intact. Psychiatric: Normal judgment and insight. Normal mood. Musculoskeletal:No digital cyanosis Skin:No Rash, warm and dry Wounds:N/A  I have personally reviewed following labs and imaging studies  LABORATORY DATA: CBC: Recent Labs  Lab 12/10/16 0900 12/11/16 1011 12/12/16 0529 12/14/16 0530 12/15/16 0615  WBC 3.8* 4.3 4.0 6.5 7.0  HGB 12.2 12.0 11.3* 12.6 11.6*  HCT 36.5 35.5* 34.5* 38.4 34.9*  MCV 91.7 92.0 92.7 92.1 91.1  PLT 221 210 193 252 299    Basic Metabolic Panel: Recent Labs  Lab 12/11/16 1011 12/12/16 0529  12/13/16 0532 12/14/16 0530 12/15/16 0615  NA 137 141 140 137 141  K 4.3 4.2 4.2 4.2 3.9  CL 106 108 101 101 108  CO2 22 25 28 26 28   GLUCOSE 129* 87 111* 190* 137*  BUN 10 9 6 8 7   CREATININE 0.53 0.56 0.63 0.64 0.49  CALCIUM 8.6* 8.7* 9.3 8.9 8.9    GFR: Estimated Creatinine Clearance: 87.9 mL/min (by C-G formula based on SCr of 0.49 mg/dL).  Liver Function Tests: Recent Labs  Lab 12/11/16 1011 12/12/16 0529 12/13/16 0532 12/14/16 0530 12/15/16 0615  AST 162* 184* 295* 408* 182*  ALT 334* 354* 505* 585* 447*  ALKPHOS 213* 225* 269* 247* 213*  BILITOT 9.1* 8.5* 10.1* 9.8* 6.4*  PROT 5.6* 5.7* 6.6 6.2* 5.6*  ALBUMIN 2.7* 2.8* 3.2* 2.7* 2.7*   Recent Labs  Lab 12/10/16 0125  12/10/16 0501 12/11/16 0632 12/13/16 0532 12/14/16 0530  LIPASE 2,778* 1,764* 68* 43 29   Recent Labs  Lab 12/13/16 1145  AMMONIA 24    Coagulation Profile: Recent Labs  Lab 12/10/16 1531 12/14/16 1147  INR 0.93 1.05    Cardiac Enzymes: Recent Labs  Lab 12/10/16 0001  TROPONINI <0.03    BNP (last 3 results) No results for input(s): PROBNP in the last 8760 hours.  HbA1C: No results for input(s): HGBA1C in the last 72 hours.  CBG: Recent Labs  Lab 12/10/16 0909  GLUCAP 87    Lipid Profile: No results for input(s): CHOL, HDL, LDLCALC, TRIG, CHOLHDL, LDLDIRECT in the last 72 hours.  Thyroid Function Tests: No results for input(s): TSH, T4TOTAL, FREET4, T3FREE, THYROIDAB in the last 72 hours.  Anemia Panel: No results for input(s): VITAMINB12, FOLATE, FERRITIN, TIBC, IRON, RETICCTPCT in the last 72 hours.  Urine analysis:    Component Value Date/Time   COLORURINE AMBER (A) 12/09/2016 2355   APPEARANCEUR CLEAR 12/09/2016 2355   LABSPEC 1.011 12/09/2016 2355   PHURINE 5.0 12/09/2016 2355   GLUCOSEU NEGATIVE 12/09/2016 2355   HGBUR MODERATE (A) 12/09/2016 2355   BILIRUBINUR SMALL (A) 12/09/2016 2355   KETONESUR NEGATIVE 12/09/2016 2355   PROTEINUR NEGATIVE 12/09/2016 2355   NITRITE NEGATIVE 12/09/2016 2355   LEUKOCYTESUR TRACE (A) 12/09/2016 2355    Sepsis Labs: Lactic Acid, Venous No results found for: LATICACIDVEN  MICROBIOLOGY: Recent Results (from the past 240 hour(s))  Surgical pcr screen     Status: Abnormal   Collection Time: 12/12/16 10:32 AM  Result Value Ref Range Status   MRSA, PCR POSITIVE (A) NEGATIVE Final    Comment: RESULT CALLED TO, READ BACK BY AND VERIFIED WITH: ORAEGBUNAN,I. RN @1402  ON 11.05.18 BY COHEN,K    Staphylococcus aureus POSITIVE (A) NEGATIVE Final    Comment: (NOTE) The Xpert SA Assay (FDA approved for NASAL specimens in patients 3 years of age and older), is one component of a comprehensive surveillance  program. It is not intended to diagnose infection nor to guide or monitor treatment.     RADIOLOGY STUDIES/RESULTS: Dg Chest 2 View  Result Date: 12/10/2016 CLINICAL DATA:  61 year old female with shortness of breath. EXAM: CHEST  2 VIEW COMPARISON:  None. FINDINGS: The lungs are clear. There is no pleural effusion or pneumothorax. The cardiac silhouette is within normal limits. There is no acute osseous pathology. IMPRESSION: No active cardiopulmonary disease. Electronically Signed   By: Anner Crete M.D.   On: 12/10/2016 01:05   Dg Cholangiogram Operative  Result Date: 12/14/2016 CLINICAL DATA:  61 year old female with a history of  cholelithiasis EXAM: INTRAOPERATIVE CHOLANGIOGRAM TECHNIQUE: Cholangiographic images from the C-arm fluoroscopic device were submitted for interpretation post-operatively. Please see the procedural report for the amount of contrast and the fluoroscopy time utilized. COMPARISON:  CT 12/10/2016, ERCP 12/10/2016 FINDINGS: Surgical instruments project over the upper abdomen. There is attempted cannulation of the cystic duct/gallbladder neck, with small amount of extraluminal contrast within the abdomen. No duct opacification on the limited images. . IMPRESSION: Limited images of intraoperative cholangiogram demonstrates small amount extraluminal contrast. Please refer to the dictated operative report for full details of intraoperative findings and procedure. Electronically Signed   By: Corrie Mckusick D.O.   On: 12/14/2016 07:20   Ct Abdomen Pelvis W Contrast  Result Date: 12/10/2016 CLINICAL DATA:  Unintended weight loss. Abdominal pain. Weakness and nausea. Jaundice. Back pain. Elevated liver function tests. EXAM: CT ABDOMEN AND PELVIS WITH CONTRAST TECHNIQUE: Multidetector CT imaging of the abdomen and pelvis was performed using the standard protocol following bolus administration of intravenous contrast. CONTRAST:  177mL ISOVUE-300 IOPAMIDOL (ISOVUE-300) INJECTION 61%  COMPARISON:  None. FINDINGS: LOWER CHEST: Dependent atelectasis. Included heart size is normal. No pericardial effusion. HEPATOBILIARY: Mild intrahepatic biliary dilatation. Liver is otherwise unremarkable. Distended gallbladder, subcentimeter layering faint suspected gallstones. Dilated cystic duct. Proximal Common bile duct is 9 mm. Subcentimeter intermediate density filling defect distal Common bile duct (coronal 87/191, sagittal 92/191). PANCREAS: Normal. SPLEEN: Normal. ADRENALS/URINARY TRACT: Kidneys are orthotopic, demonstrating symmetric enhancement. No nephrolithiasis, hydronephrosis or solid renal masses. The unopacified ureters are normal in course and caliber. Delayed imaging through the kidneys demonstrates symmetric prompt contrast excretion within the proximal urinary collecting system. Urinary bladder is partially distended and unremarkable. Normal adrenal glands. STOMACH/BOWEL: The stomach, small and large bowel are normal in course and caliber without inflammatory changes, sensitivity decreased without oral contrast. Mild sigmoid colonic diverticulosis. Normal appendix. VASCULAR/LYMPHATIC: Aortoiliac vessels are normal in course and caliber. Mild calcific atherosclerosis. No lymphadenopathy by CT size criteria. REPRODUCTIVE: 4.8 x 4.6 cm calcified RIGHT pelvic mass contiguous with RIGHT adnexae and posterior uterus. Small similarly calcified mass within the uterus. OTHER: No intraperitoneal free fluid or free air. MUSCULOSKELETAL: Nonacute. Mild degenerative change of the spine. Small fat containing umbilical hernia. IMPRESSION: 1. Intra and extra hepatic biliary dilatation to the level of the distal CBD were there is an obstructing gallstone, less likely mass. 2. Cholelithiasis without CT findings of acute cholecystitis. 3. 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 4. Acute findings discussed with and reconfirmed by Dr.DAVID YAO on  12/10/2016 at 2:20 am. Electronically Signed   By: Elon Alas M.D.   On: 12/10/2016 02:22   Dg Ercp  Result Date: 12/14/2016 CLINICAL DATA:  61 year old female with a biliary leak. EXAM: ERCP with stent placement TECHNIQUE: Multiple spot images obtained with the fluoroscopic device and submitted for interpretation post-procedure. FLUOROSCOPY TIME:  Fluoroscopy Time:  0 minutes 33 seconds reported COMPARISON:  Prior year CP images 11 07/27/2016 and 12/10/2016 FINDINGS: Two intraoperative saved images an a cine clip are submitted for review. The images demonstrate a flexible endoscope in the descending duodenum with cannulation of the common bile duct. Cholangiogram is performed confirming successful cannulation of the intrahepatic ducts. On the final image, a plastic biliary stent has been placed. IMPRESSION: ERCP with plastic biliary stent placement as above. These images were submitted for radiologic interpretation only. Please see the procedural report for the amount of contrast and the fluoroscopy time utilized. Electronically Signed   By: Myrle Sheng  Laurence Ferrari M.D.   On: 12/14/2016 15:32   Dg Ercp Biliary & Pancreatic Ducts  Result Date: 12/10/2016 CLINICAL DATA:  Bile duct stone EXAM: ERCP TECHNIQUE: Multiple spot images obtained with the fluoroscopic device and submitted for interpretation post-procedure. FLUOROSCOPY TIME:  Fluoroscopy Time:  1 minutes and 42 seconds Radiation Exposure Index (if provided by the fluoroscopic device): Number of Acquired Spot Images: 4 COMPARISON:  None. FINDINGS: Contrast fills the biliary tree. Balloon stone retrieval is documented. IMPRESSION: See above. These images were submitted for radiologic interpretation only. Please see the procedural report for the amount of contrast and the fluoroscopy time utilized. Electronically Signed   By: Marybelle Killings M.D.   On: 12/10/2016 13:30     LOS: 5 days   Oren Binet, MD  [facility] Pager:[phone]  If  7PM-7AM, please contact night-coverage [email or web address] Password [email or web address] 12/15/2016, 2:42 PM

## 2016-12-15 NOTE — Telephone Encounter (Signed)
Request from Dessa Phi, RN CM for a hospital follow up appointment for the patient at [facility] after discharge.  There are currently no appointments available for hospital follow up. The patient will need to call the clinic # [phone] to schedule an appointment.

## 2016-12-15 NOTE — Care Management Note (Signed)
Case Management Note  Patient Details  Name: Andrea Blackwell MRN: [id number] Date of Birth: July 31, 1955  Subjective/Objective: PT recc HHPT-patient has no health insurance-out of pocket pay-[facility] chosen-[facility] rep Santiago Glad will assess for charity-HHRN(safety eval, HH social worker;dme-rw. [place] for pcp appt-patient will call on her own for pcp appt. No further CM needs.                   Action/Plan:d/c home w/HHC/dme.   Expected Discharge Date:                  Expected Discharge Plan:  [place]  In-House Referral:     Discharge planning Services  CM Consult, Medication Assistance, [facility]  Post Acute Care Choice:    Choice offered to:  Patient  DME Arranged:  [place] DME Agency:  [place]:  RN, Social Work [employer] Agency:  [place]  Status of Service:  In process, will continue to follow  If discussed at Long Length of Stay Meetings, dates discussed:    Additional Comments:  Dessa Phi, RN 12/15/2016, 2:54 PM

## 2016-12-16 DIAGNOSIS — R17 Unspecified jaundice: Secondary | ICD-10-CM

## 2016-12-16 LAB — COMPREHENSIVE METABOLIC PANEL
ALT: 343 U/L — ABNORMAL HIGH (ref 14–54)
AST: 155 U/L — AB (ref 15–41)
Albumin: 2.5 g/dL — ABNORMAL LOW (ref 3.5–5.0)
Alkaline Phosphatase: 183 U/L — ABNORMAL HIGH (ref 38–126)
Anion gap: 7 (ref 5–15)
BUN: 8 mg/dL (ref 6–20)
CHLORIDE: 104 mmol/L (ref 101–111)
CO2: 26 mmol/L (ref 22–32)
Calcium: 8.2 mg/dL — ABNORMAL LOW (ref 8.9–10.3)
Creatinine, Ser: 0.49 mg/dL (ref 0.44–1.00)
GFR calc Af Amer: >60 mL/min (ref >60)
Glucose, Bld: 123 mg/dL — ABNORMAL HIGH (ref 65–99)
POTASSIUM: 3.7 mmol/L (ref 3.5–5.1)
Sodium: 137 mmol/L (ref 135–145)
Total Bilirubin: 6 mg/dL — ABNORMAL HIGH (ref 0.3–1.2)
Total Protein: 5.4 g/dL — ABNORMAL LOW (ref 6.5–8.1)

## 2016-12-16 LAB — CBC
HEMATOCRIT: 34.3 % — AB (ref 36.0–46.0)
Hemoglobin: 11.4 g/dL — ABNORMAL LOW (ref 12.0–15.0)
MCH: 30.8 pg (ref 26.0–34.0)
MCHC: 33.2 g/dL (ref 30.0–36.0)
MCV: 92.7 fL (ref 78.0–100.0)
Platelets: 248 10*3/uL (ref 150–400)
RBC: 3.7 MIL/uL — AB (ref 3.87–5.11)
RDW: 14.5 % (ref 11.5–15.5)
WBC: 8.4 10*3/uL (ref 4.0–10.5)

## 2016-12-16 MED ORDER — OXYCODONE HCL 5 MG PO TABS: 5.0000 mg | ORAL_TABLET | Freq: Four times a day (QID) | ORAL | 0 refills | Status: DC | PRN

## 2016-12-16 MED ORDER — ONDANSETRON HCL 4 MG PO TABS: 4.0000 mg | ORAL_TABLET | Freq: Three times a day (TID) | ORAL | 0 refills | Status: DC | PRN

## 2016-12-16 MED ORDER — PANTOPRAZOLE SODIUM 40 MG PO TBEC: 40.0000 mg | DELAYED_RELEASE_TABLET | Freq: Every day | ORAL | 0 refills | Status: DC

## 2016-12-16 NOTE — Discharge Summary (Signed)
PATIENT DETAILS Name: Andrea Blackwell Age: 61 y.o. Sex: female Date of Birth: Jun 08, 1955 MRN: [id number]. Admitting Physician: Jani Gravel, MD BTD:[id number], No Pcp Per  Admit Date: 12/09/2016 Discharge date: 12/16/2016  Recommendations for Outpatient Follow-up:  1. Follow up with PCP in 1-2 weeks 2. Please obtain BMP/CBC in one week 3. Please ensure follow up with Gen Surgery and GI  Admitted From:  Home  Disposition: Home with home health services/   Home Health: Yes  Equipment/Devices: DME rolling walker  Discharge Condition: Stable  CODE STATUS: FULL CODE  Diet recommendation:  Regular   Brief Summary: See H&P, Labs, Consult and Test reports for all details in brief, Patient is a 61 y.o. female with past medical history of hypothyroidism admitted with abdominal pain, vomiting, she was found to have acute gallstone pancreatitis with choledocholithiasis. She was provided with supportive care, she was seen by GI and general surgery. She underwent ERCP on 11/3 with removal of a CBD stone and a sphincterotomy, subsequently general surgery was consulted, patient then underwent a laparoscopic cholecystectomy on 11/6, she was found to have a small bile leak intraoperatively. GI  reconsulted, and underwent ERCP with biliary stenting. Significantly improved, see below for further details. f  Brief Hospital Course: Gallstone pancreatitis: Managed with conservative measures, once pancreatitis improved, general surgery performed a laparoscopic cholecystectomy (see below for further details)  Diet advanced, some abdominal pain at the operative site-but significantly better. .   Acute on chronic calculus cholecystitis with choledocholithiasis and obstructive jaundice: Admitted and provided supportive care, started on empiric antimicrobial therapy. Subsequently seen by both GI and general surgery. Underwent  ERCP on 11/3 with CBD stone removal and sphincterotomy, subsequently underwent  laparoscopic cholecystectomy where she was found to have a bile leak intraoperatively. GI was reconsulted, patient subsequently underwent a repeat ERCP on 11/7, and a biliary stent was placed. LFTs have now started to decline, general surgery initially felt that patient will be discharged with the external drain, however per their note today, the plan is to remove this drain. Plans are to have patient follow-up with general surgery and with gastroenterology (for stent removal). Gen. surgery does not recommend any further antimicrobial therapy, she is doing well-continues to have some mild pain at the operative site but much improved than the past few days. Stable for discharge with close follow-up with GI and gastroenterology.   One episode of hematemesis: This occurred right before she presented to the hospital, no further episodes since then. Supportive care-doubt further workup is required at this point. 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  Procedures/Studies: 11/3>>ERCP 11/6>Lap cholecystectomy 11/7>> ERCP and biliary stent placement  Discharge Diagnoses:  Principal Problem:   Gallstone pancreatitis Active Problems:   Acute calculous cholecystitis   Hypothyroidism   Jaundice   Obesity   Bile duct leak  Discharge Instructions:  Activity:  As tolerated with Full fall precautions use walker/cane & assistance as needed   Discharge Instructions    Call MD for:  persistant nausea and vomiting   Complete by:  As directed    Call MD for:  redness, tenderness, or signs of infection (pain, swelling, redness, odor or green/yellow discharge around incision site)   Complete by:  As directed    Call MD for:  severe uncontrolled pain   Complete by:  As directed     Diet - low sodium heart healthy   Complete by:  As directed    Follow a light diet the first few days at home.   Start with a bland diet such as soups, liquids, starchy foods, low fat foods, etc.   If you feel full, bloated, or constipated, stay on a full liquid or pureed/blenderized diet for a few days until you feel better and no longer constipated. Be sure to drink plenty of fluids every day to avoid getting dehydrated (feeling dizzy, not urinating, etc.). Gradually add a fiber supplement to your diet   Diet - low sodium heart healthy   Complete by:  As directed    Discharge instructions   Complete by:  As directed    See Discharge Instructions If you are not getting better after two weeks or are noticing you are getting worse, contact our office [phone] for further advice.  We may need to adjust your medications, re-evaluate you in the office, send you to the emergency room, or see what other things we can do to help. The clinic staff is available to answer your questions during regular business hours (8:30am-5pm).  Please don't hesitate to call and ask to speak to one of our nurses for clinical concerns.    A surgeon from [facility] is always on call at the hospitals 24 hours/day If you have a medical emergency, go to the nearest emergency room or call 911.   Increase activity slowly   Complete by:  As directed    Start light daily activities --- self-care, walking, climbing stairs- beginning the day after surgery.  Gradually increase activities as tolerated.  Control your pain to be active.  Stop when you are tired.  Ideally, walk several times a day, eventually an hour a day.   Most people are back to most day-to-day activities in a few weeks.  It takes 4-8 weeks to get back to unrestricted, intense activity. If you can walk 30 minutes without difficulty, it is safe to try more intense activity such as jogging, treadmill, bicycling, low-impact aerobics, swimming,  etc. Save the most intensive and strenuous activity for last (Usually 4-8 weeks after surgery) such as sit-ups, heavy lifting, contact sports, etc.  Refrain from any intense heavy lifting or straining until you are off narcotics for pain control.  You will have off days, but things should improve week-by-week. DO NOT PUSH THROUGH PAIN.  Let pain be your guide: If it hurts to do something, don't do it.  Pain is your body warning you to avoid that activity for another week until the pain goes down.   Increase activity slowly   Complete by:  As directed    Lifting restrictions   Complete by:  As directed    If you can walk 30 minutes without difficulty, it is safe to try more intense activity such as jogging, treadmill, bicycling, low-impact aerobics, swimming, etc. Save the most intensive and strenuous activity for last (Usually 4-8 weeks after surgery) such as sit-ups, heavy lifting, contact sports, etc.  Refrain from any intense heavy lifting or straining until you are off narcotics for pain control.  You will have off days, but things should improve week-by-week. DO NOT PUSH THROUGH PAIN.  Let pain be your guide: If it hurts to do something, don't do it.  Pain is your body warning you to avoid that activity for another week until the pain goes down.   May  walk up steps   Complete by:  As directed    No wound care   Complete by:  As directed    It is good for closed incision and even open wounds to be washed every day.  Shower every day.  Short baths are fine.  Wash the incisions and wounds clean with soap & water.    You have a closed incision(s), wash the incision with soap & water every day.  You may leave closed incisions open to air if it is dry.   You may cover the incision with clean gauze & replace it after your daily shower for comfort.   You have a drain, wash around the skin exit site with soap & water and place a new dressing of gauze or band aid around the skin every day.  Keep the  drain site clean & dry.   Sexual Activity Restrictions   Complete by:  As directed    You may have sexual intercourse when it is comfortable. If it hurts to do something, stop.     Allergies as of 12/16/2016      Reactions   Penicillins Anaphylaxis   Has patient had a PCN reaction causing immediate rash, facial/tongue/throat swelling, SOB or lightheadedness with hypotension: yes Has patient had a PCN reaction causing severe rash involving mucus membranes or skin necrosis: no Has patient had a PCN reaction that required hospitalization: yes Has patient had a PCN reaction occurring within the last 10 years: no If all of the above answers are "NO", then may proceed with Cephalosporin use.      Medication List    STOP taking these medications   acetaminophen 500 MG tablet Commonly known as:  TYLENOL     TAKE these medications   bismuth subsalicylate 244 WN/02VO suspension Commonly known as:  PEPTO BISMOL Take 30 mLs by mouth every 6 (six) hours as needed for indigestion or diarrhea or loose stools.   escitalopram 10 MG tablet Commonly known as:  LEXAPRO Take 10 mg by mouth at bedtime.   levothyroxine 75 MCG tablet Commonly known as:  SYNTHROID, LEVOTHROID Take 75 mcg by mouth daily before breakfast.   ondansetron 4 MG tablet Commonly known as:  ZOFRAN Take 1 tablet (4 mg total) every 8 (eight) hours as needed by mouth for nausea or vomiting.   oxyCODONE 5 MG immediate release tablet Commonly known as:  Oxy IR/ROXICODONE Take 1 tablet (5 mg total) every 6 (six) hours as needed by mouth for moderate pain, severe pain or breakthrough pain.   pantoprazole 40 MG tablet Commonly known as:  PROTONIX Take 1 tablet (40 mg total) daily by mouth.            Durable Medical Equipment  (From admission, onward)        Start     Ordered   12/15/16 1457  For home use only DME Walker rolling  Once    Question:  Patient needs a walker to treat with the following condition   Answer:  Unsteady gait   12/15/16 1456     Follow-up Information    Milus Banister, MD. Call.   Specialty:  Gastroenterology Why:  Call and schedule a follow up appointment for bile duct stent removal in 4-6 weeks. Contact information: [address] [place] [address] [phone]        Michael Boston, MD. Go on 01/09/2017.   Specialty:  General Surgery Why:  at 2:15 PM for follow up  from recent surgery. please arrive 15 minutes early Contact information: [address] [place] [address] [phone]        [facility]. Schedule an appointment as soon as possible for a visit.   Why:  within 1 week.Bring in photo ID, & d/c papers. Contact information: [place] [address] [place], [facility] Follow up.   Specialty:  Home Health Services Why:  [facility] nursing, social worker Contact information: [address] [place] [address] [place] Follow up.   Why:  rolling walker Contact information: [place] [address] [phone]          Allergies  Allergen Reactions  . Penicillins Anaphylaxis    Has patient had a PCN reaction causing immediate rash, facial/tongue/throat swelling, SOB or lightheadedness with hypotension: yes Has patient had a PCN reaction causing severe rash involving mucus membranes or skin necrosis: no Has patient had a PCN reaction that required hospitalization: yes Has patient had a PCN reaction occurring within the last 10 years: no If all of the above answers are "NO", then may proceed with Cephalosporin use.     Consultations:   GI and general surgery  Other Procedures/Studies: Dg Chest 2 View  Result Date: 12/10/2016 CLINICAL DATA:  61 year old female with shortness of breath. EXAM: CHEST  2 VIEW COMPARISON:  None. FINDINGS: The lungs are  clear. There is no pleural effusion or pneumothorax. The cardiac silhouette is within normal limits. There is no acute osseous pathology. IMPRESSION: No active cardiopulmonary disease. Electronically Signed   By: Anner Crete M.D.   On: 12/10/2016 01:05   Dg Cholangiogram Operative  Result Date: 12/14/2016 CLINICAL DATA:  61 year old female with a history of cholelithiasis EXAM: INTRAOPERATIVE CHOLANGIOGRAM TECHNIQUE: Cholangiographic images from the C-arm fluoroscopic device were submitted for interpretation post-operatively. Please see the procedural report for the amount of contrast and the fluoroscopy time utilized. COMPARISON:  CT 12/10/2016, ERCP 12/10/2016 FINDINGS: Surgical instruments project over the upper abdomen. There is attempted cannulation of the cystic duct/gallbladder neck, with small amount of extraluminal contrast within the abdomen. No duct opacification on the limited images. . IMPRESSION: Limited images of intraoperative cholangiogram demonstrates small amount extraluminal contrast. Please refer to the dictated operative report for full details of intraoperative findings and procedure. Electronically Signed   By: Corrie Mckusick D.O.   On: 12/14/2016 07:20   Ct Abdomen Pelvis W Contrast  Result Date: 12/10/2016 CLINICAL DATA:  Unintended weight loss. Abdominal pain. Weakness and nausea. Jaundice. Back pain. Elevated liver function tests. EXAM: CT ABDOMEN AND PELVIS WITH CONTRAST TECHNIQUE: Multidetector CT imaging of the abdomen and pelvis was performed using the standard protocol following bolus administration of intravenous contrast. CONTRAST:  177mL ISOVUE-300 IOPAMIDOL (ISOVUE-300) INJECTION 61% COMPARISON:  None. FINDINGS: LOWER CHEST: Dependent atelectasis. Included heart size is normal. No pericardial effusion. HEPATOBILIARY: Mild intrahepatic biliary dilatation. Liver is otherwise unremarkable. Distended gallbladder, subcentimeter layering faint suspected gallstones. Dilated  cystic duct. Proximal Common bile duct is 9 mm. Subcentimeter intermediate density filling defect distal Common bile duct (coronal 87/191, sagittal 92/191). PANCREAS: Normal. SPLEEN: Normal. ADRENALS/URINARY TRACT: Kidneys are orthotopic, demonstrating symmetric enhancement. No nephrolithiasis, hydronephrosis or solid renal masses. The unopacified ureters are normal in course and caliber. Delayed imaging through the kidneys demonstrates symmetric prompt contrast excretion within the proximal urinary  collecting system. Urinary bladder is partially distended and unremarkable. Normal adrenal glands. STOMACH/BOWEL: The stomach, small and large bowel are normal in course and caliber without inflammatory changes, sensitivity decreased without oral contrast. Mild sigmoid colonic diverticulosis. Normal appendix. VASCULAR/LYMPHATIC: Aortoiliac vessels are normal in course and caliber. Mild calcific atherosclerosis. No lymphadenopathy by CT size criteria. REPRODUCTIVE: 4.8 x 4.6 cm calcified RIGHT pelvic mass contiguous with RIGHT adnexae and posterior uterus. Small similarly calcified mass within the uterus. OTHER: No intraperitoneal free fluid or free air. MUSCULOSKELETAL: Nonacute. Mild degenerative change of the spine. Small fat containing umbilical hernia. IMPRESSION: 1. Intra and extra hepatic biliary dilatation to the level of the distal CBD were there is an obstructing gallstone, less likely mass. 2. Cholelithiasis without CT findings of acute cholecystitis. 3. 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 4. Acute findings discussed with and reconfirmed by Dr.DAVID YAO on 12/10/2016 at 2:20 am. Electronically Signed   By: Elon Alas M.D.   On: 12/10/2016 02:22   Dg Ercp  Result Date: 12/14/2016 CLINICAL DATA:  61 year old female with a biliary leak. EXAM: ERCP with stent placement TECHNIQUE: Multiple spot images obtained with the fluoroscopic  device and submitted for interpretation post-procedure. FLUOROSCOPY TIME:  Fluoroscopy Time:  0 minutes 33 seconds reported COMPARISON:  Prior year CP images 11 07/27/2016 and 12/10/2016 FINDINGS: Two intraoperative saved images an a cine clip are submitted for review. The images demonstrate a flexible endoscope in the descending duodenum with cannulation of the common bile duct. Cholangiogram is performed confirming successful cannulation of the intrahepatic ducts. On the final image, a plastic biliary stent has been placed. IMPRESSION: ERCP with plastic biliary stent placement as above. These images were submitted for radiologic interpretation only. Please see the procedural report for the amount of contrast and the fluoroscopy time utilized. Electronically Signed   By: Jacqulynn Cadet M.D.   On: 12/14/2016 15:32   Dg Ercp Biliary & Pancreatic Ducts  Result Date: 12/10/2016 CLINICAL DATA:  Bile duct stone EXAM: ERCP TECHNIQUE: Multiple spot images obtained with the fluoroscopic device and submitted for interpretation post-procedure. FLUOROSCOPY TIME:  Fluoroscopy Time:  1 minutes and 42 seconds Radiation Exposure Index (if provided by the fluoroscopic device): Number of Acquired Spot Images: 4 COMPARISON:  None. FINDINGS: Contrast fills the biliary tree. Balloon stone retrieval is documented. IMPRESSION: See above. These images were submitted for radiologic interpretation only. Please see the procedural report for the amount of contrast and the fluoroscopy time utilized. Electronically Signed   By: Marybelle Killings M.D.   On: 12/10/2016 13:30     TODAY-DAY OF DISCHARGE:  Subjective:   Rozelia Rueb today has no headache,no chest abdominal pain,no new weakness tingling or numbness, feels much better wants to go home today.   Objective:   Blood pressure (!) 154/60, pulse 80, temperature 98.7 F (37.1 C), temperature source Oral, resp. rate 18, height 5' 6.5" (1.689 m), weight 92.7 kg (204 lb 5.9 oz), SpO2  95 %.  Intake/Output Summary (Last 24 hours) at 12/16/2016 1012 Last data filed at 12/16/2016 0600 Gross per 24 hour  Intake 2520 ml  Output 2200 ml  Net 320 ml   Filed Weights   12/14/16 1300 12/15/16 0529 12/16/16 0437  Weight: 92.1 kg (203 lb) 92.9 kg (204 lb 12.9 oz) 92.7 kg (204 lb 5.9 oz)    Exam: Awake Alert, Oriented *3, No new F.N deficits, Normal affect [place].AT,PERRAL Supple Neck,No JVD, No cervical lymphadenopathy  appriciated.  Symmetrical Chest wall movement, Good air movement bilaterally, CTAB RRR,No Gallops,Rubs or new Murmurs, No Parasternal Heave +ve B.Sounds, Abd Soft, minimal tenderness at the operative site, No organomegaly appriciated, No rebound -guarding or rigidity. No Cyanosis, Clubbing or edema, No new Rash or bruise   PERTINENT RADIOLOGIC STUDIES: Dg Chest 2 View  Result Date: 12/10/2016 CLINICAL DATA:  61 year old female with shortness of breath. EXAM: CHEST  2 VIEW COMPARISON:  None. FINDINGS: The lungs are clear. There is no pleural effusion or pneumothorax. The cardiac silhouette is within normal limits. There is no acute osseous pathology. IMPRESSION: No active cardiopulmonary disease. Electronically Signed   By: Anner Crete M.D.   On: 12/10/2016 01:05   Dg Cholangiogram Operative  Result Date: 12/14/2016 CLINICAL DATA:  61 year old female with a history of cholelithiasis EXAM: INTRAOPERATIVE CHOLANGIOGRAM TECHNIQUE: Cholangiographic images from the C-arm fluoroscopic device were submitted for interpretation post-operatively. Please see the procedural report for the amount of contrast and the fluoroscopy time utilized. COMPARISON:  CT 12/10/2016, ERCP 12/10/2016 FINDINGS: Surgical instruments project over the upper abdomen. There is attempted cannulation of the cystic duct/gallbladder neck, with small amount of extraluminal contrast within the abdomen. No duct opacification on the limited images. . IMPRESSION: Limited images of intraoperative  cholangiogram demonstrates small amount extraluminal contrast. Please refer to the dictated operative report for full details of intraoperative findings and procedure. Electronically Signed   By: Corrie Mckusick D.O.   On: 12/14/2016 07:20   Ct Abdomen Pelvis W Contrast  Result Date: 12/10/2016 CLINICAL DATA:  Unintended weight loss. Abdominal pain. Weakness and nausea. Jaundice. Back pain. Elevated liver function tests. EXAM: CT ABDOMEN AND PELVIS WITH CONTRAST TECHNIQUE: Multidetector CT imaging of the abdomen and pelvis was performed using the standard protocol following bolus administration of intravenous contrast. CONTRAST:  156mL ISOVUE-300 IOPAMIDOL (ISOVUE-300) INJECTION 61% COMPARISON:  None. FINDINGS: LOWER CHEST: Dependent atelectasis. Included heart size is normal. No pericardial effusion. HEPATOBILIARY: Mild intrahepatic biliary dilatation. Liver is otherwise unremarkable. Distended gallbladder, subcentimeter layering faint suspected gallstones. Dilated cystic duct. Proximal Common bile duct is 9 mm. Subcentimeter intermediate density filling defect distal Common bile duct (coronal 87/191, sagittal 92/191). PANCREAS: Normal. SPLEEN: Normal. ADRENALS/URINARY TRACT: Kidneys are orthotopic, demonstrating symmetric enhancement. No nephrolithiasis, hydronephrosis or solid renal masses. The unopacified ureters are normal in course and caliber. Delayed imaging through the kidneys demonstrates symmetric prompt contrast excretion within the proximal urinary collecting system. Urinary bladder is partially distended and unremarkable. Normal adrenal glands. STOMACH/BOWEL: The stomach, small and large bowel are normal in course and caliber without inflammatory changes, sensitivity decreased without oral contrast. Mild sigmoid colonic diverticulosis. Normal appendix. VASCULAR/LYMPHATIC: Aortoiliac vessels are normal in course and caliber. Mild calcific atherosclerosis. No lymphadenopathy by CT size criteria.  REPRODUCTIVE: 4.8 x 4.6 cm calcified RIGHT pelvic mass contiguous with RIGHT adnexae and posterior uterus. Small similarly calcified mass within the uterus. OTHER: No intraperitoneal free fluid or free air. MUSCULOSKELETAL: Nonacute. Mild degenerative change of the spine. Small fat containing umbilical hernia. IMPRESSION: 1. Intra and extra hepatic biliary dilatation to the level of the distal CBD were there is an obstructing gallstone, less likely mass. 2. Cholelithiasis without CT findings of acute cholecystitis. 3. 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 4. Acute findings discussed with and reconfirmed by Dr.DAVID YAO on 12/10/2016 at 2:20 am. Electronically Signed   By: Elon Alas M.D.   On: 12/10/2016 02:22   Dg Ercp  Result Date:  12/14/2016 CLINICAL DATA:  61 year old female with a biliary leak. EXAM: ERCP with stent placement TECHNIQUE: Multiple spot images obtained with the fluoroscopic device and submitted for interpretation post-procedure. FLUOROSCOPY TIME:  Fluoroscopy Time:  0 minutes 33 seconds reported COMPARISON:  Prior year CP images 11 07/27/2016 and 12/10/2016 FINDINGS: Two intraoperative saved images an a cine clip are submitted for review. The images demonstrate a flexible endoscope in the descending duodenum with cannulation of the common bile duct. Cholangiogram is performed confirming successful cannulation of the intrahepatic ducts. On the final image, a plastic biliary stent has been placed. IMPRESSION: ERCP with plastic biliary stent placement as above. These images were submitted for radiologic interpretation only. Please see the procedural report for the amount of contrast and the fluoroscopy time utilized. Electronically Signed   By: Jacqulynn Cadet M.D.   On: 12/14/2016 15:32   Dg Ercp Biliary & Pancreatic Ducts  Result Date: 12/10/2016 CLINICAL DATA:  Bile duct stone EXAM: ERCP TECHNIQUE: Multiple spot  images obtained with the fluoroscopic device and submitted for interpretation post-procedure. FLUOROSCOPY TIME:  Fluoroscopy Time:  1 minutes and 42 seconds Radiation Exposure Index (if provided by the fluoroscopic device): Number of Acquired Spot Images: 4 COMPARISON:  None. FINDINGS: Contrast fills the biliary tree. Balloon stone retrieval is documented. IMPRESSION: See above. These images were submitted for radiologic interpretation only. Please see the procedural report for the amount of contrast and the fluoroscopy time utilized. Electronically Signed   By: Marybelle Killings M.D.   On: 12/10/2016 13:30     PERTINENT LAB RESULTS: CBC: Recent Labs    12/15/16 0615 12/16/16 0540  WBC 7.0 8.4  HGB 11.6* 11.4*  HCT 34.9* 34.3*  PLT 220 248   CMET CMP     Component Value Date/Time   NA 137 12/16/2016 0540   K 3.7 12/16/2016 0540   CL 104 12/16/2016 0540   CO2 26 12/16/2016 0540   GLUCOSE 123 (H) 12/16/2016 0540   BUN 8 12/16/2016 0540   CREATININE 0.49 12/16/2016 0540   CALCIUM 8.2 (L) 12/16/2016 0540   PROT 5.4 (L) 12/16/2016 0540   ALBUMIN 2.5 (L) 12/16/2016 0540   AST 155 (H) 12/16/2016 0540   ALT 343 (H) 12/16/2016 0540   ALKPHOS 183 (H) 12/16/2016 0540   BILITOT 6.0 (H) 12/16/2016 0540   GFRNONAA >60 12/16/2016 0540   GFRAA >60 12/16/2016 0540    GFR Estimated Creatinine Clearance: 87.7 mL/min (by C-G formula based on SCr of 0.49 mg/dL). Recent Labs    12/14/16 0530  LIPASE 29   No results for input(s): CKTOTAL, CKMB, CKMBINDEX, TROPONINI in the last 72 hours. Invalid input(s): POCBNP No results for input(s): DDIMER in the last 72 hours. No results for input(s): HGBA1C in the last 72 hours. No results for input(s): CHOL, HDL, LDLCALC, TRIG, CHOLHDL, LDLDIRECT in the last 72 hours. No results for input(s): TSH, T4TOTAL, T3FREE, THYROIDAB in the last 72 hours.  Invalid input(s): FREET3 No results for input(s): VITAMINB12, FOLATE, FERRITIN, TIBC, IRON, RETICCTPCT in  the last 72 hours. Coags: Recent Labs    12/14/16 1147  INR 1.05   Microbiology: Recent Results (from the past 240 hour(s))  Surgical pcr screen     Status: Abnormal   Collection Time: 12/12/16 10:32 AM  Result Value Ref Range Status   MRSA, PCR POSITIVE (A) NEGATIVE Final    Comment: RESULT CALLED TO, READ BACK BY AND VERIFIED WITH: ORAEGBUNAN,I. RN @1402  ON 11.05.18 BY COHEN,K    Staphylococcus  aureus POSITIVE (A) NEGATIVE Final    Comment: (NOTE) The Xpert SA Assay (FDA approved for NASAL specimens in patients 38 years of age and older), is one component of a comprehensive surveillance program. It is not intended to diagnose infection nor to guide or monitor treatment.     FURTHER DISCHARGE INSTRUCTIONS:  Get Medicines reviewed and adjusted: Please take all your medications with you for your next visit with your Primary MD  Laboratory/radiological data: Please request your Primary MD to go over all hospital tests and procedure/radiological results at the follow up, please ask your Primary MD to get all Hospital records sent to his/her office.  In some cases, they will be blood work, cultures and biopsy results pending at the time of your discharge. Please request that your primary care M.D. goes through all the records of your hospital data and follows up on these results.  Also Note the following: If you experience worsening of your admission symptoms, develop shortness of breath, life threatening emergency, suicidal or homicidal thoughts you must seek medical attention immediately by calling 911 or calling your MD immediately  if symptoms less severe.  You must read complete instructions/literature along with all the possible adverse reactions/side effects for all the Medicines you take and that have been prescribed to you. Take any new Medicines after you have completely understood and accpet all the possible adverse reactions/side effects.   Do not drive when taking  Pain medications or sleeping medications (Benzodaizepines)  Do not take more than prescribed Pain, Sleep and Anxiety Medications. It is not advisable to combine anxiety,sleep and pain medications without talking with your primary care practitioner  Special Instructions: If you have smoked or chewed Tobacco  in the last 2 yrs please stop smoking, stop any regular Alcohol  and or any Recreational drug use.  Wear Seat belts while driving.  Please note: You were cared for by a hospitalist during your hospital stay. Once you are discharged, your primary care physician will handle any further medical issues. Please note that NO REFILLS for any discharge medications will be authorized once you are discharged, as it is imperative that you return to your primary care physician (or establish a relationship with a primary care physician if you do not have one) for your post hospital discharge needs so that they can reassess your need for medications and monitor your lab values.  Total Time spent coordinating discharge including counseling, education and face to face time equals 45 minutes.  Signed: Kandra Graven 12/16/2016 10:12 AM

## 2016-12-16 NOTE — Telephone Encounter (Signed)
The pt will be called in 6 weeks to set up.

## 2016-12-16 NOTE — Progress Notes (Signed)
Ride is here  [employer]  For  JP site cover given.voices no c/o.

## 2016-12-16 NOTE — Progress Notes (Signed)
Physical Therapy Treatment Patient Details Name: Andrea Blackwell MRN: [id number] DOB: 1956/01/11 Today's Date: 12/16/2016    History of Present Illness Pt is a 61 year old female admitted with acute gallstone pancreatitis 2/2 choledocholithiasis      PT Comments    Pt ambulated to/from stairs and practiced safe stair technique. Pt's friend's home has a flight and pt wished to practice prior to d/c.  Pt reports ambulating to nurses station earlier this morning.  Pt's RW in room so adjusted to appropriate height.  Pt feels ready for d/c home later today.   Follow Up Recommendations  Home health PT     Equipment Recommendations  Rolling walker with 5" wheels    Recommendations for Other Services       Precautions / Restrictions Precautions Precaution Comments: R JP drain    Mobility  Bed Mobility               General bed mobility comments: pt up in recliner on arrival  Transfers Overall transfer level: Needs assistance Equipment used: Rolling walker (2 wheeled) Transfers: Sit to/from Stand Sit to Stand: Supervision         General transfer comment: verbal cues for hand placement  Ambulation/Gait Ambulation/Gait assistance: Supervision Ambulation Distance (Feet): 40 Feet Assistive device: Rolling walker (2 wheeled) Gait Pattern/deviations: Step-through pattern;Decreased stride length     General Gait Details: verbal cues for RW positioning, ambulated to/from stairs   Stairs Stairs: Yes   Stair Management: One rail Left;Step to pattern;Forwards Number of Stairs: 10 General stair comments: pt wished to practice flight of stairs, performed halfway with rail and half without rail, slow however no physical assist required, educated on step to for safety at this time; friend can also provide HHA - educated pt on hand placement  Wheelchair Mobility    Modified Rankin (Stroke Patients Only)       Balance                                             Cognition Arousal/Alertness: Awake/alert Behavior During Therapy: WFL for tasks assessed/performed Overall Cognitive Status: Within Functional Limits for tasks assessed                                        Exercises      General Comments        Pertinent Vitals/Pain Pain Assessment: 0-10 Pain Score: 2  Pain Location: surgical site Pain Descriptors / Indicators: Sore Pain Intervention(s): Limited activity within patient's tolerance;Repositioned;Monitored during session    Home Living                      Prior Function            PT Goals (current goals can now be found in the care plan section) Progress towards PT goals: Progressing toward goals    Frequency    Min 3X/week      PT Plan Current plan remains appropriate    Co-evaluation              AM-PAC PT "6 Clicks" Daily Activity  Outcome Measure  Difficulty turning over in bed (including adjusting bedclothes, sheets and blankets)?: None Difficulty moving from lying on back to sitting on the side of  the bed? : A Lot Difficulty sitting down on and standing up from a chair with arms (Blackwell.g., wheelchair, bedside commode, etc,.)?: A Lot Help needed moving to and from a bed to chair (including a wheelchair)?: A Little Help needed walking in hospital room?: A Little Help needed climbing 3-5 steps with a railing? : A Little 6 Click Score: 17    End of Session   Activity Tolerance: Patient tolerated treatment well Patient left: in chair;with call bell/phone within reach Nurse Communication: Mobility status PT Visit Diagnosis: Difficulty in walking, not elsewhere classified (R26.2)     Time: 9201-0071 PT Time Calculation (min) (ACUTE ONLY): 14 min  Charges:  $Gait Training: 8-22 mins                    G Codes:      Andrea Blackwell, PT, DPT 12/16/2016 Pager: [phone]  Andrea Blackwell 12/16/2016, 11:51 AM

## 2016-12-16 NOTE — Progress Notes (Signed)
D/c instructions given w/ verbal understanding.Awaiting ride.

## 2016-12-16 NOTE — Progress Notes (Signed)
12/16/2016   [address], [place] [address] Phone: [phone]  FAX: [phone]      Andrea Blackwell [age]  CARE TEAM:  PCP: Patient, No Pcp Per  Outpatient Care Team: Patient Care Team: Patient, No Pcp Per as PCP - General (General Practice)  Inpatient Treatment Team: Treatment Team: Attending Provider: Jonetta Osgood, MD; Consulting Physician: Edison Pace, Md, MD; Technician: Darrick Grinder, NT; Technician: Lucila Maine, NT; Technician: Demaris Callander, NT; Registered Nurse: Tacey Ruiz, RN; Rounding Team: Redmond Baseman, MD; Registered Nurse: Arville Lime, RN; Physical Therapist: Junius Argyle, PT; Registered Nurse: Leonie Douglas, RN; Case Manager: Dessa Phi, RN   Problem List:   Principal Problem:   Gallstone pancreatitis Active Problems:   Acute calculous cholecystitis   Hypothyroidism   Jaundice   Obesity   Bile duct leak    12/10/2016 Procedure:  ENDOSCOPIC RETROGRADE CHOLANGIOPANCREATOGRAPHY (ERCP)  12/13/2016   POST-OPERATIVE DIAGNOSIS:   Acute on Chronic Calculus Cholecystitis Gallstone pancreatitis Pinhole common bile duct leak. Liver: Probable cholestasis.  Possible steatohepatitis  PROCEDURE:    Laparoscopic cholecystectomy w IOC Partial CBD primary repair Core Liver Biopsy  SURGEON:  [name], MD, FACS.      Assessment/Plan Acute gallstone pancreatitis 2/2 choledocholithiasis   S/p ERCP w/ sphincterotomy 11/3 Dr. Benson Norway  Status post laparocopic cholecystectomy with cholangiogram.  Repair of common bile duct with persistent small leak.  Core liver biopsy  Follow-up ERCP with cholangiogram showing no persistent leak anymore.  Stent placement.  She is improving.  Okay to discharge from surgery standpoint.  Because liver function tests have markedly improved, drainage is serosanguineous, and no proof of persistent bile  leak; then, go ahead and remove drain today.  Discussed with floor nurse who will relay to the patient's nurse.  Order placed   Heartburn/GERD: Protonix to p.o. FEN: NPO, IVF  ID: ciprofloxacin  VTE: SCD's    20 minutes spent in review, evaluation, examination, counseling, and coordination of care.  More than 50% of that time was spent in counseling.  I updated the patient's status to the patient.  Recommendations were made.  Questions were answered.  She expressed understanding & appreciation.   [name], M.D., F.A.C.S. Gastrointestinal and Minimally Invasive Surgery [facility] [address], [place], [place] [address] [phone] Main / Paging   12/16/2016    Subjective: (Chief complaint)  Feeling much better.  Jaundice resolving.  Using walker to get around.  Feeling less pain.  Only mild nausea now.   Objective:  Vital signs:  Vitals:   12/15/16 0529 12/15/16 1459 12/15/16 2125 12/16/16 0437  BP: (!) 135/51 (!) 126/50 (!) 158/65 (!) 154/60  Pulse: 67 77 84 80  Resp: _0 Temp: 97.6 F (36.4 C) 98.1 F (36.7 C) 98.3 F (36.8 C) 98.7 F (37.1 C)  TempSrc: Oral Oral Oral Oral  SpO2: 97% 95% 100% 95%  Weight: 92.9 kg (204 lb 12.9 oz)   92.7 kg (204 lb 5.9 oz)  Height:        Last BM Date: 12/11/16  Intake/Output   Yesterday:  11/08 0701 - 11/09 0700 In: 2520 [P.O.:1320; I.V.:1200] Out: 2500 [Urine:1700; Drains:100] This shift:  No intake/output data recorded.  Bowel function:  Flatus: YES  BM:  YES  Drain: Serosanguinous   Physical Exam:  General: Pt awake/alert/oriented x4 in no acute distress Eyes: PERRL,  normal EOM.  Sclera clear.  + icterus Neuro: CN II-XII intact w/o focal sensory/motor deficits. Lymph: No head/neck/groin lymphadenopathy Psych:  No delerium/psychosis/paranoia HENT: Normocephalic, Mucus membranes moist.  No thrush Neck: Supple, No tracheal deviation Chest: No  chest wall pain w good excursion CV:  Pulses intact.  Regular rhythm MS: Normal AROM mjr joints.  No obvious deformity  Abdomen: Soft.  Nondistended.  Mildly tender at incisions only.  No evidence of peritonitis.  No incarcerated hernias.  Ext:   No deformity.  No mjr edema.  No cyanosis Skin: No petechiae / purpura.  Mildly jaundiced - improved  Results:   Labs: Results for orders placed or performed during the hospital encounter of 12/09/16 (from the past 48 hour(s))  Protime-INR     Status: None   Collection Time: 12/14/16 11:47 AM  Result Value Ref Range   Prothrombin Time 13.6 11.4 - 15.2 seconds   INR 1.05   CBC     Status: Abnormal   Collection Time: 12/15/16  6:15 AM  Result Value Ref Range   WBC 7.0 4.0 - 10.5 K/uL   RBC 3.83 (L) 3.87 - 5.11 MIL/uL   Hemoglobin 11.6 (L) 12.0 - 15.0 g/dL   HCT 34.9 (L) 36.0 - 46.0 %   MCV 91.1 78.0 - 100.0 fL   MCH 30.3 26.0 - 34.0 pg   MCHC 33.2 30.0 - 36.0 g/dL   RDW 14.2 11.5 - 15.5 %   Platelets 220 150 - 400 K/uL  Basic metabolic panel     Status: Abnormal   Collection Time: 12/15/16  6:15 AM  Result Value Ref Range   Sodium 141 135 - 145 mmol/L   Potassium 3.9 3.5 - 5.1 mmol/L   Chloride 108 101 - 111 mmol/L   CO2 28 22 - 32 mmol/L   Glucose, Bld 137 (H) 65 - 99 mg/dL   BUN 7 6 - 20 mg/dL   Creatinine, Ser 0.49 0.44 - 1.00 mg/dL   Calcium 8.9 8.9 - 10.3 mg/dL   GFR calc non Af Amer >60 >60 mL/min   GFR calc Af Amer >60 >60 mL/min    Comment: (NOTE) The eGFR has been calculated using the CKD EPI equation. This calculation has not been validated in all clinical situations. eGFR's persistently <60 mL/min signify possible Chronic Kidney Disease.    Anion gap 5 5 - 15  Hepatic function panel     Status: Abnormal   Collection Time: 12/15/16  6:15 AM  Result Value Ref Range   Total Protein 5.6 (L) 6.5 - 8.1 g/dL   Albumin 2.7 (L) 3.5 - 5.0 g/dL   AST 182 (H) 15 - 41 U/L   ALT 447 (H) 14 - 54 U/L   Alkaline Phosphatase  213 (H) 38 - 126 U/L   Total Bilirubin 6.4 (H) 0.3 - 1.2 mg/dL   Bilirubin, Direct 3.6 (H) 0.1 - 0.5 mg/dL   Indirect Bilirubin 2.8 (H) 0.3 - 0.9 mg/dL  Comprehensive metabolic panel     Status: Abnormal   Collection Time: 12/16/16  5:40 AM  Result Value Ref Range   Sodium 137 135 - 145 mmol/L   Potassium 3.7 3.5 - 5.1 mmol/L   Chloride 104 101 - 111 mmol/L   CO2 26 22 - 32 mmol/L   Glucose, Bld 123 (H) 65 - 99 mg/dL   BUN 8 6 - 20 mg/dL   Creatinine, Ser 0.49 0.44 - 1.00 mg/dL   Calcium 8.2 (L) 8.9 - 10.3  mg/dL   Total Protein 5.4 (L) 6.5 - 8.1 g/dL   Albumin 2.5 (L) 3.5 - 5.0 g/dL   AST 155 (H) 15 - 41 U/L   ALT 343 (H) 14 - 54 U/L   Alkaline Phosphatase 183 (H) 38 - 126 U/L   Total Bilirubin 6.0 (H) 0.3 - 1.2 mg/dL   GFR calc non Af Amer >60 >60 mL/min   GFR calc Af Amer >60 >60 mL/min    Comment: (NOTE) The eGFR has been calculated using the CKD EPI equation. This calculation has not been validated in all clinical situations. eGFR's persistently <60 mL/min signify possible Chronic Kidney Disease.    Anion gap 7 5 - 15  CBC     Status: Abnormal   Collection Time: 12/16/16  5:40 AM  Result Value Ref Range   WBC 8.4 4.0 - 10.5 K/uL   RBC 3.70 (L) 3.87 - 5.11 MIL/uL   Hemoglobin 11.4 (L) 12.0 - 15.0 g/dL   HCT 34.3 (L) 36.0 - 46.0 %   MCV 92.7 78.0 - 100.0 fL   MCH 30.8 26.0 - 34.0 pg   MCHC 33.2 30.0 - 36.0 g/dL   RDW 14.5 11.5 - 15.5 %   Platelets 248 150 - 400 K/uL    Imaging / Studies: Dg Ercp  Result Date: 12/14/2016 CLINICAL DATA:  61 year old female with a biliary leak. EXAM: ERCP with stent placement TECHNIQUE: Multiple spot images obtained with the fluoroscopic device and submitted for interpretation post-procedure. FLUOROSCOPY TIME:  Fluoroscopy Time:  0 minutes 33 seconds reported COMPARISON:  Prior year CP images 11 07/27/2016 and 12/10/2016 FINDINGS: Two intraoperative saved images an a cine clip are submitted for review. The images demonstrate a flexible  endoscope in the descending duodenum with cannulation of the common bile duct. Cholangiogram is performed confirming successful cannulation of the intrahepatic ducts. On the final image, a plastic biliary stent has been placed. IMPRESSION: ERCP with plastic biliary stent placement as above. These images were submitted for radiologic interpretation only. Please see the procedural report for the amount of contrast and the fluoroscopy time utilized. Electronically Signed   By: Jacqulynn Cadet M.D.   On: 12/14/2016 15:32    Medications / Allergies: per chart  Antibiotics: Anti-infectives (From admission, onward)   Start     Dose/Rate Route Frequency Ordered Stop   12/13/16 1722  ciprofloxacin (CIPRO) 400 MG/200ML IVPB    Comments:  Enrigue Catena   : cabinet override      12/13/16 1722 12/13/16 1732   12/13/16 1630  ciprofloxacin (CIPRO) IVPB 400 mg  Status:  Discontinued    Comments:  Pharmacy may adjust dosing strength, interval, or rate of medication as needed for optimal therapy for the patient Send with patient on call to the OR.  Anesthesia to complete antibiotic administration <72mn prior to incision per [facility]   400 mg 200 mL/hr over 60 Minutes Intravenous On call to O.R. 12/13/16 1127 12/13/16 2047   12/13/16 1630  metroNIDAZOLE (FLAGYL) IVPB 500 mg    Comments:  Pharmacy may adjust dosing strength, interval, or rate of medication as needed for optimal therapy for the patient Send with patient on call to the OR.  Anesthesia to complete antibiotic administration <667m prior to incision per [facility]  500 mg 100 mL/hr over 60 Minutes Intravenous On call to O.R. 12/13/16 1127 12/13/16 1733   12/12/16 0715  clindamycin (CLEOCIN) IVPB 900 mg     900 mg 100 mL/hr over 30  Minutes Intravenous On call to O.R. 12/12/16 4403 12/13/16 0559   12/12/16 0715  gentamicin (GARAMYCIN) 370 mg in dextrose 5 % 100 mL IVPB     5 mg/kg  74.1 kg (Adjusted) 109.3 mL/hr over 60 Minutes  Intravenous On call to O.R. 12/12/16 0712 12/13/16 0559   12/10/16 1300  ciprofloxacin (CIPRO) IVPB 400 mg  Status:  Discontinued     400 mg 200 mL/hr over 60 Minutes Intravenous 2 times daily 12/10/16 1217 12/15/16 0801        Note: Portions of this report may have been transcribed using voice recognition software. Every effort was made to ensure accuracy; however, inadvertent computerized transcription errors may be present.   Any transcriptional errors that result from this process are unintentional.     [name], M.D., F.A.C.S. Gastrointestinal and Minimally Invasive Surgery [facility] [address] [place], [place] [address] [phone] Main / Paging   12/16/2016

## 2016-12-21 ENCOUNTER — Ambulatory Visit: Payer: Self-pay | Attending: Internal Medicine | Admitting: Physician Assistant

## 2016-12-21 VITALS — BP 115/75 | HR 71 | Temp 98.4°F | Resp 16 | Wt 198.0 lb

## 2016-12-21 DIAGNOSIS — Z79891 Long term (current) use of opiate analgesic: Secondary | ICD-10-CM | POA: Insufficient documentation

## 2016-12-21 DIAGNOSIS — K8050 Calculus of bile duct without cholangitis or cholecystitis without obstruction: Secondary | ICD-10-CM | POA: Insufficient documentation

## 2016-12-21 DIAGNOSIS — Z880 Allergy status to penicillin: Secondary | ICD-10-CM | POA: Insufficient documentation

## 2016-12-21 DIAGNOSIS — Z79899 Other long term (current) drug therapy: Secondary | ICD-10-CM | POA: Insufficient documentation

## 2016-12-21 DIAGNOSIS — E039 Hypothyroidism, unspecified: Secondary | ICD-10-CM | POA: Insufficient documentation

## 2016-12-21 DIAGNOSIS — K851 Biliary acute pancreatitis without necrosis or infection: Secondary | ICD-10-CM | POA: Insufficient documentation

## 2016-12-21 DIAGNOSIS — F329 Major depressive disorder, single episode, unspecified: Secondary | ICD-10-CM | POA: Insufficient documentation

## 2016-12-21 DIAGNOSIS — F419 Anxiety disorder, unspecified: Secondary | ICD-10-CM | POA: Insufficient documentation

## 2016-12-21 DIAGNOSIS — Z09 Encounter for follow-up examination after completed treatment for conditions other than malignant neoplasm: Secondary | ICD-10-CM

## 2016-12-21 DIAGNOSIS — Z9889 Other specified postprocedural states: Secondary | ICD-10-CM | POA: Insufficient documentation

## 2016-12-21 NOTE — Patient Instructions (Signed)
Please call the GI Team (Dr. Ardis Hughs) for an appt in the next 2 weeks Keep appt with Dr. Johney Maine

## 2016-12-21 NOTE — Progress Notes (Signed)
Andrea Blackwell  JXB:[id number]  [id number]  DOB - 1955/09/24  Chief Complaint  Patient presents with  . Hospitalization Follow-up       Subjective:   Andrea Blackwell is a 61 y.o. female here today for establishment of care. She has a prior history of hypothyroidism. She takes level thyroxine 50 g daily. She recently was diagnosed with depression and anxiety due to adult situational stress and started on Lexapro 10 mg daily. She presented to the hospital on 12/09/2016 with nausea, vomiting and hematemesis. She has lost 15 pounds over the last couple of weeks prior to her admission. She was having right upper quadrant pain without fevers or chills. Her lipase was 2778. AST 228. AST 505. She was found to have acute gallstone pancreatitis with choledocholithiasis. She was provided with supportive care, she was seen by GI and general surgery. She underwent ERCP on 11/3 with removal of a CBD stone and a sphincterotomy, subsequently general surgery was consulted, patient then underwent a laparoscopic cholecystectomy on 11/6, she was found to have a small bile leak intraoperatively. GI  reconsulted, and underwent ERCP with biliary stenting 12/15/16. All drains were removed prior to discharge on 12/16/2016.  She did not require antibiotics at discharge. She was given pain medication, Zofran and Protonix. She rarely uses the pain medication. Her diet is picking up some. She still has a yellowish tinge. No nausea or vomiting. Sore but mobile. Occasionally has to take Zofran for nausea. Has not seen GI or general surgery and follow-up as of yet.   ROS: GEN: denies fever or chills, +weight loss Skin: denies lesions or rashes +jaundice HEENT: denies headache, earache, epistaxis, sore throat, or neck pain LUNGS: denies SHOB, dyspnea, PND, orthopnea CV: denies CP or palpitations ABD:+ abd pain, occasional N or V EXT: denies muscle spasms or swelling; no pain in lower ext, no weakness NEURO: denies numbness or  tingling, denies sz, stroke or TIA   ALLERGIES: Allergies  Allergen Reactions  . Penicillins Anaphylaxis    Has patient had a PCN reaction causing immediate rash, facial/tongue/throat swelling, SOB or lightheadedness with hypotension: yes Has patient had a PCN reaction causing severe rash involving mucus membranes or skin necrosis: no Has patient had a PCN reaction that required hospitalization: yes Has patient had a PCN reaction occurring within the last 10 years: no If all of the above answers are "NO", then may proceed with Cephalosporin use.     PAST MEDICAL HISTORY: Past Medical History:  Diagnosis Date  . Hypothyroidism     PAST SURGICAL HISTORY: Past Surgical History:  Procedure Laterality Date  . OVARY SURGERY      MEDICATIONS AT HOME: Prior to Admission medications   Medication Sig Start Date End Date Taking? Authorizing Provider  escitalopram (LEXAPRO) 10 MG tablet Take 10 mg by mouth at bedtime.   Yes [provider]  levothyroxine (SYNTHROID, LEVOTHROID) 75 MCG tablet Take 75 mcg by mouth daily before breakfast.   Yes [provider]  ondansetron (ZOFRAN) 4 MG tablet Take 1 tablet (4 mg total) every 8 (eight) hours as needed by mouth for nausea or vomiting. 12/16/16  Yes Ghimire, Henreitta Leber, MD  oxyCODONE (OXY IR/ROXICODONE) 5 MG immediate release tablet Take 1 tablet (5 mg total) every 6 (six) hours as needed by mouth for moderate pain, severe pain or breakthrough pain. 12/16/16  Yes Ghimire, Henreitta Leber, MD  pantoprazole (PROTONIX) 40 MG tablet Take 1 tablet (40 mg total) daily by mouth. 12/16/16  Yes  Ghimire, Henreitta Leber, MD  bismuth subsalicylate (PEPTO BISMOL) 262 MG/15ML suspension Take 30 mLs by mouth every 6 (six) hours as needed for indigestion or diarrhea or loose stools.    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 Speech Clear. Not toxic looking. Jaundice. HEENT: Atraumatic and Normocephalic, pupils equally reactive to light and accomodation Neck: supple, no JVD. No cervical lymphadenopathy.  Abdomen: Bowel sounds present, + tender and not distended Extremities: B/L Lower Ext shows no edema, both legs are warm to touch Neurology: Awake alert, and oriented X 3, CN II-XII intact, Non focal Skin:No Rash Wounds:clean and dry, no drainage, no heat   Assessment & Plan  1. Gallstone pancreatitis  -check CBC, BMP, LFTS today  -make appt with GI  -keep appt with gen surg  -cont supportive care/prns  -nutrition referral  2. Hypothyroidism  -cont Levothyroxine  3. Depression m/w anxiety  -cont Lexapro   Financial counselor appt Return in about 2 weeks (around 01/04/2017).  The patient was given clear instructions to go to ER or return to medical center if symptoms don't improve, worsen or new problems develop. The patient verbalized understanding. The patient was told to call to get lab results if they haven't heard anything in the next week.   Total time spent with patient was 31. Greater than 50 % of this visit was spent face to face counseling and coordinating care regarding risk factor modification, compliance importance and encouragement, education related to hospitalization and recovery.  This note has been created with Surveyor, quantity. Any transcriptional errors are unintentional.    Zettie Pho, PA-C [facility] and [facility] [place], [place]   12/21/2016, 10:22 AM

## 2016-12-22 ENCOUNTER — Telehealth: Payer: Self-pay | Admitting: *Deleted

## 2016-12-22 ENCOUNTER — Telehealth: Payer: Self-pay | Admitting: Gastroenterology

## 2016-12-22 ENCOUNTER — Telehealth: Payer: Self-pay | Admitting: Nurse Practitioner

## 2016-12-22 LAB — HEPATIC FUNCTION PANEL
ALBUMIN: 3.6 g/dL (ref 3.5–5.5)
ALT: 379 IU/L — ABNORMAL HIGH (ref 0–32)
AST: 266 IU/L — ABNORMAL HIGH (ref 0–40)
Alkaline Phosphatase: 266 IU/L — ABNORMAL HIGH (ref 39–117)
Bilirubin Total: 3.8 mg/dL — ABNORMAL HIGH (ref 0.0–1.2)
Bilirubin, Direct: 2.74 mg/dL — ABNORMAL HIGH (ref 0.00–0.40)
TOTAL PROTEIN: 6.3 g/dL (ref 6.0–8.5)

## 2016-12-22 LAB — CBC WITH DIFFERENTIAL/PLATELET
BASOS ABS: 0 10*3/uL (ref 0.0–0.2)
BASOS: 0 %
EOS (ABSOLUTE): 0.1 10*3/uL (ref 0.0–0.4)
EOS: 1 %
Hematocrit: 39 % (ref 34.0–46.6)
Hemoglobin: 13.4 g/dL (ref 11.1–15.9)
Immature Grans (Abs): 0 10*3/uL (ref 0.0–0.1)
Immature Granulocytes: 1 %
LYMPHS ABS: 1.1 10*3/uL (ref 0.7–3.1)
Lymphs: 17 %
MCH: 31.1 pg (ref 26.6–33.0)
MCHC: 34.4 g/dL (ref 31.5–35.7)
MCV: 91 fL (ref 79–97)
MONOS ABS: 0.3 10*3/uL (ref 0.1–0.9)
Monocytes: 5 %
NEUTROS ABS: 4.6 10*3/uL (ref 1.4–7.0)
Neutrophils: 76 %
Platelets: 340 10*3/uL (ref 150–379)
RBC: 4.31 x10E6/uL (ref 3.77–5.28)
RDW: 14.2 % (ref 12.3–15.4)
WBC: 6.1 10*3/uL (ref 3.4–10.8)

## 2016-12-22 LAB — BASIC METABOLIC PANEL
BUN/Creatinine Ratio: 15 (ref 9–23)
BUN: 9 mg/dL (ref 6–24)
CALCIUM: 8.8 mg/dL (ref 8.7–10.2)
CO2: 25 mmol/L (ref 20–29)
CREATININE: 0.59 mg/dL (ref 0.57–1.00)
Chloride: 99 mmol/L (ref 96–106)
GFR calc Af Amer: 116 mL/min/{1.73_m2} (ref >59)
GFR, EST NON AFRICAN AMERICAN: 101 mL/min/{1.73_m2} (ref >59)
Glucose: 130 mg/dL — ABNORMAL HIGH (ref 65–99)
POTASSIUM: 4.4 mmol/L (ref 3.5–5.2)
Sodium: 139 mmol/L (ref 134–144)

## 2016-12-22 NOTE — Telephone Encounter (Signed)
The pt will be called in 6 weeks to set up appt. She is aware

## 2016-12-22 NOTE — Telephone Encounter (Signed)
Notes recorded by Brayton Caves, PA-C on 12/22/2016 at 11:26 AM EST Please let her know that most of her labwork is looking better. Unfortunately her liver function tests have worsened some. Please ask her does she have the appointment with the GI doctor scheduled. If not, needs one within 1-2 week. Please make sure this gets scheduled. If for any reason she has abd pain or N or V or color looks worse, got to the ER. Thanks!  Pt aware of message and results of liver function test. She states she hasn't noticed changes in her color: eyes or stool. Color on paper after voiding is orange. She has not had emesis episode but feels that she is nausea from stress of her situation.  Aware of the need for appointment with GI specialist. [place]: Dr. Ardis Hughs.

## 2016-12-22 NOTE — Telephone Encounter (Signed)
Pt called to request a refill oxyCODONE (OXY IR/ROXICODONE) 5 MG immediate release tablet Since she forgot to get it from the PA yesterday on her office visit, please if you auth the refill or not let the Pt knows, please follow up

## 2016-12-22 NOTE — Telephone Encounter (Signed)
Pt was inform of the respond of the pcp, she still want to see her on her appt

## 2016-12-22 NOTE — Telephone Encounter (Signed)
Denied. I do not prescribe OXY IR and will not be able to at her upcoming appointment with me. Please let the patient know before she comes for her office visit.

## 2016-12-22 NOTE — Telephone Encounter (Signed)
Patient calling in regarding this. She has questions regarding appointment. [facility] # [phone]

## 2016-12-23 ENCOUNTER — Telehealth: Payer: Self-pay | Admitting: Nurse Practitioner

## 2016-12-23 NOTE — Telephone Encounter (Signed)
Left message on machine to call back  

## 2016-12-23 NOTE — Telephone Encounter (Signed)
Pt called back to speak with the triage nurse in referent of her pain medication, she asking to please call her  back

## 2016-12-23 NOTE — Telephone Encounter (Signed)
The pt is having under her right breast that is worse, it radiates to her back.  She is unable to take a deep breath.  She had labs at PCP that are in [place].  Please advise if the 6 week appt is ok?

## 2016-12-23 NOTE — Telephone Encounter (Signed)
Pt has concerns about how often to take medication for pain.   She last took medication at 2:30 pm today. Instructed it would be too soon to take another pain reliever.  She was awakened by pain during the night. Educated to stay on top of pain so it does not build,to ambulate, and use pillow to get up and down, when she coughs or sneeze. She had BM this morning, no abdominal hardening or distention, N/V.

## 2016-12-23 NOTE — Telephone Encounter (Signed)
Her liver tests are actually still improving.  6 week follow up is ok. She needs to call Dr. Johney Maine to let him know about her pains.

## 2016-12-26 NOTE — Telephone Encounter (Signed)
Pt said she is returning your call °

## 2016-12-26 NOTE — Telephone Encounter (Signed)
The pt has been advised and will follow up with Dr Johney Maine.

## 2016-12-26 NOTE — Telephone Encounter (Signed)
Called patient today to f/u from last phone call. She states she was able to get refill from Surgeon. She states she has a lot of gas. No N/V. Encourage patient to ambulate. Pt reminded of appointment on next week.

## 2017-01-02 ENCOUNTER — Ambulatory Visit: Payer: Self-pay | Attending: Internal Medicine

## 2017-01-04 ENCOUNTER — Encounter: Payer: Self-pay | Admitting: Nurse Practitioner

## 2017-01-04 ENCOUNTER — Ambulatory Visit: Payer: Self-pay | Attending: Nurse Practitioner | Admitting: Nurse Practitioner

## 2017-01-04 VITALS — BP 120/73 | HR 67 | Temp 98.9°F | Ht 67.0 in | Wt 199.0 lb

## 2017-01-04 DIAGNOSIS — F329 Major depressive disorder, single episode, unspecified: Secondary | ICD-10-CM | POA: Insufficient documentation

## 2017-01-04 DIAGNOSIS — R7309 Other abnormal glucose: Secondary | ICD-10-CM

## 2017-01-04 DIAGNOSIS — E039 Hypothyroidism, unspecified: Secondary | ICD-10-CM | POA: Insufficient documentation

## 2017-01-04 DIAGNOSIS — F419 Anxiety disorder, unspecified: Secondary | ICD-10-CM | POA: Insufficient documentation

## 2017-01-04 DIAGNOSIS — Z880 Allergy status to penicillin: Secondary | ICD-10-CM | POA: Insufficient documentation

## 2017-01-04 DIAGNOSIS — Z9049 Acquired absence of other specified parts of digestive tract: Secondary | ICD-10-CM | POA: Insufficient documentation

## 2017-01-04 DIAGNOSIS — F32A Depression, unspecified: Secondary | ICD-10-CM

## 2017-01-04 DIAGNOSIS — Z79899 Other long term (current) drug therapy: Secondary | ICD-10-CM | POA: Insufficient documentation

## 2017-01-04 DIAGNOSIS — Z79890 Hormone replacement therapy (postmenopausal): Secondary | ICD-10-CM | POA: Insufficient documentation

## 2017-01-04 DIAGNOSIS — R739 Hyperglycemia, unspecified: Secondary | ICD-10-CM | POA: Insufficient documentation

## 2017-01-04 DIAGNOSIS — Z1211 Encounter for screening for malignant neoplasm of colon: Secondary | ICD-10-CM

## 2017-01-04 LAB — POCT GLYCOSYLATED HEMOGLOBIN (HGB A1C): Hemoglobin A1C: 5.5

## 2017-01-04 MED ORDER — ESCITALOPRAM OXALATE 20 MG PO TABS: 20.0000 mg | ORAL_TABLET | Freq: Every day | ORAL | 0 refills | Status: DC

## 2017-01-04 MED ORDER — PANTOPRAZOLE SODIUM 40 MG PO TBEC: 40.0000 mg | DELAYED_RELEASE_TABLET | Freq: Every day | ORAL | 1 refills | Status: DC

## 2017-01-04 MED FILL — ESCITALOPRAM 20 MG TABLET: 20 | 30 days supply | Qty: 30 | Fill #0

## 2017-01-04 MED FILL — ?PANTOPRAZOLE SOD DR 40MG: 40 MG | 30 days supply | Qty: 30 | Fill #0

## 2017-01-04 NOTE — Progress Notes (Signed)
Assessment & Plan:  Andrea Blackwell was seen today for establish care.  Diagnoses and all orders for this visit:  Anxiety and depression Lexapro increased from 10mg  to 20mg  daily. -     escitalopram (LEXAPRO) 20 MG tablet; Take 1 tablet (20 mg total) by mouth at bedtime. Denies SI/HI  Colon cancer screening -     Ambulatory referral to Gastroenterology  Elevated glucose level -     HgB A1c     Patient has been counseled on age-appropriate routine health concerns for screening and prevention. These are reviewed and up-to-date. Referrals have been placed accordingly. Immunizations are up-to-date or declined.    Subjective:   Chief Complaint  Patient presents with  . Establish Care    Patient would like to talk about Protonix and Levopro medications. Patient stated that she have a lot of gas everyday. Patient is concern and would like to know if its okay.    HPI Andrea Blackwell 61 y.o. female presents to office today for anxiety and depression.  December 4th Returns to general surgery for ERCP 12-2016.  Plans to see GI in a few weeks but has not made appointment. Plans to see them "on the 19th if there is an opening for an appointment". Stent was placed November 7th. She currently has minimal nausea. She is weaning herself off of her pain medication. She is still having to take 1 to 2 Tablets at night to help her sleep. Reports still having issues with taking deep breaths due to pain. When she lies on her left side she has an uncomfortable sensatsion (feels like everything is flying around)  Depression Started taking lexapro 10mg . She is losing her home, going through a divorce, having financial issues at this time. She has an appointment with a nutritionist in a few weeks to help with overeating and unhealthy eating. She is also concerned about the results of her biopsy. She is currently taking protonix but does not know the reason she is taking it. I instructed her to take it until she sees GI. She  would like to increase her lexapro which I think is a reasonable request. She denies any SI/HI.  She is also having increased gas. Likely related to surgery.   Hypothyroidism She is medication compliant and denies any hyper or hypothyroid symptoms. Currently taking synthroid 47mcg daily.  Lab Results  Component Value Date   TSH 0.578 12/10/2016      Review of Systems  Constitutional: Negative for fever, malaise/fatigue and weight loss.  HENT: Negative.  Negative for nosebleeds.   Eyes: Negative.  Negative for blurred vision, double vision and photophobia.  Respiratory: Positive for shortness of breath. Negative for cough.   Cardiovascular: Negative.  Negative for chest pain, palpitations and leg swelling.  Gastrointestinal: Positive for abdominal pain. Negative for blood in stool, constipation, diarrhea, heartburn, melena, nausea and vomiting.  Musculoskeletal: Negative.  Negative for myalgias.  Neurological: Negative.  Negative for dizziness, focal weakness, seizures and headaches.  Endo/Heme/Allergies: Negative for environmental allergies.  Psychiatric/Behavioral: Positive for depression. Negative for hallucinations, memory loss, substance abuse and suicidal ideas. The patient is nervous/anxious. The patient does not have insomnia.     Past Medical History:  Diagnosis Date  . Hypothyroidism     Past Surgical History:  Procedure Laterality Date  . ERCP N/A 12/10/2016   Procedure: ENDOSCOPIC RETROGRADE CHOLANGIOPANCREATOGRAPHY (ERCP);  Surgeon: Carol Ada, MD;  Location: [facility];  Service: Endoscopy;  Laterality: N/A;  . ERCP N/A 12/14/2016  Procedure: ENDOSCOPIC RETROGRADE CHOLANGIOPANCREATOGRAPHY (ERCP);  Surgeon: Milus Banister, MD;  Location: [facility];  Service: Endoscopy;  Laterality: N/A;  . LAPAROSCOPIC CHOLECYSTECTOMY SINGLE SITE WITH INTRAOPERATIVE CHOLANGIOGRAM N/A 12/13/2016   Procedure: LAPAROSCOPIC CHOLECYSTECTOMY SINGLE SITE AND LIVER BIOPSY;  Surgeon:  Michael Boston, MD;  Location: [facility];  Service: General;  Laterality: N/A;  . OVARY SURGERY      History reviewed. No pertinent family history.  Social History Reviewed with no changes to be made today.   Outpatient Medications Prior to Visit  Medication Sig Dispense Refill  . levothyroxine (SYNTHROID, LEVOTHROID) 75 MCG tablet Take 75 mcg by mouth daily before breakfast.    . oxyCODONE (OXY IR/ROXICODONE) 5 MG immediate release tablet Take 1 tablet (5 mg total) every 6 (six) hours as needed by mouth for moderate pain, severe pain or breakthrough pain. 20 tablet 0  . escitalopram (LEXAPRO) 10 MG tablet Take 10 mg by mouth at bedtime.    . pantoprazole (PROTONIX) 40 MG tablet Take 1 tablet (40 mg total) daily by mouth. 30 tablet 0  . bismuth subsalicylate (PEPTO BISMOL) 262 MG/15ML suspension Take 30 mLs by mouth every 6 (six) hours as needed for indigestion or diarrhea or loose stools.    . ondansetron (ZOFRAN) 4 MG tablet Take 1 tablet (4 mg total) every 8 (eight) hours as needed by mouth for nausea or vomiting. (Patient not taking: Reported on 01/04/2017) 20 tablet 0   No facility-administered medications prior to visit.     Allergies  Allergen Reactions  . Penicillins Anaphylaxis    Has patient had a PCN reaction causing immediate rash, facial/tongue/throat swelling, SOB or lightheadedness with hypotension: yes Has patient had a PCN reaction causing severe rash involving mucus membranes or skin necrosis: no Has patient had a PCN reaction that required hospitalization: yes Has patient had a PCN reaction occurring within the last 10 years: no If all of the above answers are "NO", then may proceed with Cephalosporin use.        Objective:    BP 120/73 (BP Location: Right Arm, Patient Position: Sitting, Cuff Size: Normal)   Pulse 67   Temp 98.9 F (37.2 C) (Oral)   Ht 5\' 7"  (1.702 m)   Wt 199 lb (90.3 kg)   SpO2 93%   BMI 31.17 kg/m  Wt Readings from Last 3 Encounters:    01/04/17 199 lb (90.3 kg)  12/21/16 198 lb (89.8 kg)  12/16/16 204 lb 5.9 oz (92.7 kg)    Physical Exam  Constitutional: She is oriented to person, place, and time. She appears well-developed and well-nourished. She is cooperative.  HENT:  Head: Normocephalic and atraumatic.  Eyes: EOM are normal.  Neck: Normal range of motion.  Cardiovascular: Normal rate, regular rhythm, normal heart sounds and intact distal pulses. Exam reveals no gallop and no friction rub.  No murmur heard. Pulmonary/Chest: Effort normal and breath sounds normal. No tachypnea. No respiratory distress. She has no decreased breath sounds. She has no wheezes. She has no rhonchi. She has no rales. She exhibits no tenderness.  Abdominal: Soft. Bowel sounds are normal.  Musculoskeletal: Normal range of motion. She exhibits no edema.  Neurological: She is alert and oriented to person, place, and time. Coordination normal.  Skin: Skin is warm and dry.  Psychiatric: She has a normal mood and affect. Her speech is normal and behavior is normal. Judgment and thought content normal. Cognition and memory are normal.  Nursing note and vitals reviewed.  Patient has been counseled extensively about nutrition and exercise as well as the importance of adherence with medications and regular follow-up. The patient was given clear instructions to go to ER or return to medical center if symptoms don't improve, worsen or new problems develop. The patient verbalized understanding.   Follow-up: Return in about 4 weeks (around 02/01/2017) for follow up on lexapro.   Gildardo Pounds, FNP-BC [facility] [place], [place]

## 2017-01-06 ENCOUNTER — Telehealth: Payer: Self-pay

## 2017-01-06 NOTE — Telephone Encounter (Signed)
-----   Message from Gildardo Pounds, NP sent at 01/06/2017  9:12 AM EST ----- Please let patient know she does not have diabetes. A1c is 5.5

## 2017-01-06 NOTE — Telephone Encounter (Signed)
Patient inform of A1C results.  Patient verified DOB.

## 2017-01-06 NOTE — Telephone Encounter (Signed)
Patient informed on A1C result.  Patient verified DOB.

## 2017-01-07 ENCOUNTER — Encounter: Payer: Self-pay | Admitting: Nurse Practitioner

## 2017-01-09 ENCOUNTER — Telehealth: Payer: Self-pay

## 2017-01-09 NOTE — Telephone Encounter (Signed)
-----   Message from Illene Regulus sent at 01/09/2017  3:02 PM EST ----- Hello, We saw this pt in office this pm for f/u by Dr Johney Maine. He is wanting to know when the pt is going to be scheduled for her 6wk f/u w/Dr Ardis Hughs to have the stent removal. Can you let me know when you have this pt scheduled please? The pt is very anxious. Thanks [employer]

## 2017-01-09 NOTE — Telephone Encounter (Signed)
Andrea Blackwell I will let you know as soon as I get it scheduled.

## 2017-01-11 ENCOUNTER — Other Ambulatory Visit: Payer: Self-pay

## 2017-01-11 DIAGNOSIS — K838 Other specified diseases of biliary tract: Secondary | ICD-10-CM

## 2017-01-11 DIAGNOSIS — Z4689 Encounter for fitting and adjustment of other specified devices: Secondary | ICD-10-CM

## 2017-01-11 DIAGNOSIS — K851 Biliary acute pancreatitis without necrosis or infection: Secondary | ICD-10-CM

## 2017-01-11 DIAGNOSIS — R17 Unspecified jaundice: Secondary | ICD-10-CM

## 2017-01-11 NOTE — Telephone Encounter (Signed)
ERCP scheduled, pt instructed and medications reviewed.  Patient instructions mailed to home.  Patient to call with any questions or concerns.  

## 2017-01-12 ENCOUNTER — Encounter: Payer: Self-pay | Admitting: Gastroenterology

## 2017-01-16 ENCOUNTER — Ambulatory Visit: Payer: Self-pay | Admitting: Dietician

## 2017-01-23 ENCOUNTER — Other Ambulatory Visit: Payer: Self-pay | Admitting: Obstetrics and Gynecology

## 2017-01-23 ENCOUNTER — Other Ambulatory Visit: Payer: Self-pay

## 2017-01-23 ENCOUNTER — Encounter (HOSPITAL_COMMUNITY): Payer: Self-pay | Admitting: Emergency Medicine

## 2017-01-23 DIAGNOSIS — Z1231 Encounter for screening mammogram for malignant neoplasm of breast: Secondary | ICD-10-CM

## 2017-02-01 ENCOUNTER — Ambulatory Visit: Payer: Self-pay | Attending: Nurse Practitioner | Admitting: Nurse Practitioner

## 2017-02-01 ENCOUNTER — Encounter: Payer: Self-pay | Admitting: Nurse Practitioner

## 2017-02-01 DIAGNOSIS — Z79890 Hormone replacement therapy (postmenopausal): Secondary | ICD-10-CM | POA: Insufficient documentation

## 2017-02-01 DIAGNOSIS — Z79899 Other long term (current) drug therapy: Secondary | ICD-10-CM | POA: Insufficient documentation

## 2017-02-01 DIAGNOSIS — Z9049 Acquired absence of other specified parts of digestive tract: Secondary | ICD-10-CM | POA: Insufficient documentation

## 2017-02-01 DIAGNOSIS — F419 Anxiety disorder, unspecified: Secondary | ICD-10-CM | POA: Insufficient documentation

## 2017-02-01 DIAGNOSIS — F329 Major depressive disorder, single episode, unspecified: Secondary | ICD-10-CM | POA: Insufficient documentation

## 2017-02-01 DIAGNOSIS — E039 Hypothyroidism, unspecified: Secondary | ICD-10-CM | POA: Insufficient documentation

## 2017-02-01 DIAGNOSIS — Z880 Allergy status to penicillin: Secondary | ICD-10-CM | POA: Insufficient documentation

## 2017-02-01 MED ORDER — ESCITALOPRAM OXALATE 20 MG PO TABS: 20.0000 mg | ORAL_TABLET | Freq: Every day | ORAL | 1 refills | Status: DC

## 2017-02-01 MED FILL — ?PANTOPRAZOLE SOD DR 40MG: 40 MG | 30 days supply | Qty: 30 | Fill #1

## 2017-02-01 MED FILL — ESCITALOPRAM 20 MG TABLET: 20 | 30 days supply | Qty: 30 | Fill #1

## 2017-02-01 NOTE — Anesthesia Preprocedure Evaluation (Addendum)
Anesthesia Evaluation  Patient identified by MRN, date of birth, ID band Patient awake    Reviewed: Allergy & Precautions, NPO status , Patient's Chart, lab work & pertinent test results  History of Anesthesia Complications Negative for: history of anesthetic complications  Airway Mallampati: II  TM Distance: >3 FB Neck ROM: Full    Dental  (+) Caps, Implants, Dental Advisory Given, Loose   Pulmonary neg pulmonary ROS,    breath sounds clear to auscultation       Cardiovascular (-) anginanegative cardio ROS   Rhythm:Regular Rate:Normal     Neuro/Psych negative neurological ROS     GI/Hepatic Neg liver ROS, GERD  Medicated and Controlled,  Endo/Other  Hypothyroidism Morbid obesity  Renal/GU negative Renal ROS     Musculoskeletal   Abdominal (+) + obese,   Peds  Hematology negative hematology ROS (+)   Anesthesia Other Findings   Reproductive/Obstetrics                            Anesthesia Physical Anesthesia Plan  ASA: II  Anesthesia Plan: General   Post-op Pain Management:    Induction: Intravenous  PONV Risk Score and Plan: 3 and Ondansetron, Dexamethasone and Scopolamine patch - Pre-op  Airway Management Planned: Oral ETT  Additional Equipment:   Intra-op Plan:   Post-operative Plan: Extubation in OR  Informed Consent: I have reviewed the patients History and Physical, chart, labs and discussed the procedure including the risks, benefits and alternatives for the proposed anesthesia with the patient or authorized representative who has indicated his/her understanding and acceptance.   Dental advisory given  Plan Discussed with: CRNA and Surgeon  Anesthesia Plan Comments: (Plan routine monitors, GETA)        Anesthesia Quick Evaluation

## 2017-02-01 NOTE — Patient Instructions (Addendum)
Drink at least 60oz of water per day (minimum).    Major Depressive Disorder, Adult Major depressive disorder (MDD) is a mental health condition. MDD often makes you feel sad, hopeless, or helpless. MDD can also cause symptoms in your body. MDD can affect your:  Work.  School.  Relationships.  Other normal activities.  MDD can range from mild to very bad. It may occur once (single episode MDD). It can also occur many times (recurrent MDD). The main symptoms of MDD often include:  Feeling sad, depressed, or irritable most of the time.  Loss of interest.  MDD symptoms also include:  Sleeping too much or too little.  Eating too much or too little.  A change in your weight.  Feeling tired (fatigue) or having low energy.  Feeling worthless.  Feeling guilty.  Trouble making decisions.  Trouble thinking clearly.  Thoughts of suicide or harming others.  Feeling weak.  Feeling agitated.  Keeping yourself from being around other people (isolation).  Follow these instructions at home: Activity  Do these things as told by your doctor: ? Go back to your normal activities. ? Exercise regularly. ? Spend time outdoors. Alcohol  Talk with your doctor about how alcohol can affect your antidepressant medicines.  Do not drink alcohol. Or, limit how much alcohol you drink. ? This means no more than 1 drink a day for nonpregnant women and 2 drinks a day for men. One drink equals one of these:  12 oz of beer.  5 oz of wine.  1 oz of hard liquor. General instructions  Take over-the-counter and prescription medicines only as told by your doctor.  Eat a healthy diet.  Get plenty of sleep.  Find activities that you enjoy. Make time to do them.  Think about joining a support group. Your doctor may be able to suggest a group for you.  Keep all follow-up visits as told by your doctor. This is important. Where to find more information:  [employer] on Mental  Illness: ? www.nami.[place]: ? https://carter.com/  National Suicide Prevention Lifeline: ? (972) 035-5763. This is free, 24-hour help. Contact a doctor if:  Your symptoms get worse.  You have new symptoms. Get help right away if:  You self-harm.  You see, hear, taste, smell, or feel things that are not present (hallucinate). If you ever feel like you may hurt yourself or others, or have thoughts about taking your own life, get help right away. You can go to your nearest emergency department or call:  Your local emergency services (911 in the [place]  A suicide crisis helpline, such as the National Suicide Prevention Lifeline: ? 475-127-7631. This is open 24 hours a day.  This information is not intended to replace advice given to you by your health care provider. Make sure you discuss any questions you have with your health care provider. Document Released: 01/05/2015 Document Revised: 10/11/2015 Document Reviewed: 10/11/2015 Elsevier Interactive Patient Education  2017 [employer].

## 2017-02-01 NOTE — Progress Notes (Signed)
Assessment & Plan:  Andrea Blackwell was seen today for follow-up.  Diagnoses and all orders for this visit:  Anxiety and depression -     escitalopram (LEXAPRO) 20 MG tablet; Take 1 tablet (20 mg total) by mouth at bedtime.    Patient has been counseled on age-appropriate routine health concerns for screening and prevention. These are reviewed and up-to-date. Referrals have been placed accordingly. Immunizations are up-to-date or declined.    Subjective:   Chief Complaint  Patient presents with  . Follow-up    Patient is here for a follow-up. Patient stated she is in pain on her right abdomen due from surgery.    HPI Andrea Blackwell 61 y.o. female presents to office today for follow up to anxiety and depression.  Anxiety and Depression Currently going through a "messy divorce". We increased her lexapro from 10mg  to 20mg  a few weeks ago. She reports improved mood with the increase. We also discussed dietary and exercise modifications to improve mood. She denies SI/HI at this time.    Review of Systems  Constitutional: Positive for malaise/fatigue. Negative for chills, diaphoresis, fever and weight loss.  Respiratory: Negative.  Negative for cough, sputum production, shortness of breath and wheezing.   Cardiovascular: Negative.  Negative for chest pain, palpitations and leg swelling.  Gastrointestinal: Positive for abdominal pain. Negative for constipation, diarrhea, heartburn, melena and vomiting.  Musculoskeletal: Negative.   Neurological: Negative.  Negative for dizziness, tingling, sensory change, speech change, focal weakness, loss of consciousness, weakness and headaches.  Psychiatric/Behavioral: Positive for depression and memory loss. Negative for hallucinations, substance abuse and suicidal ideas. The patient is nervous/anxious. The patient does not have insomnia.     Past Medical History:  Diagnosis Date  . Hypothyroidism   . Pancreatitis     Past Surgical History:  Procedure  Laterality Date  . CHOLECYSTECTOMY    . ERCP N/A 12/10/2016   Procedure: ENDOSCOPIC RETROGRADE CHOLANGIOPANCREATOGRAPHY (ERCP);  Surgeon: Carol Ada, MD;  Location: [facility];  Service: Endoscopy;  Laterality: N/A;  . ERCP N/A 12/14/2016   Procedure: ENDOSCOPIC RETROGRADE CHOLANGIOPANCREATOGRAPHY (ERCP);  Surgeon: Milus Banister, MD;  Location: [facility];  Service: Endoscopy;  Laterality: N/A;  . LAPAROSCOPIC CHOLECYSTECTOMY SINGLE SITE WITH INTRAOPERATIVE CHOLANGIOGRAM N/A 12/13/2016   Procedure: LAPAROSCOPIC CHOLECYSTECTOMY SINGLE SITE AND LIVER BIOPSY;  Surgeon: Michael Boston, MD;  Location: [facility];  Service: General;  Laterality: N/A;  . OVARY SURGERY      History reviewed. No pertinent family history.  Social History Reviewed with no changes to be made today.   Outpatient Medications Prior to Visit  Medication Sig Dispense Refill  . levothyroxine (SYNTHROID, LEVOTHROID) 75 MCG tablet Take 75 mcg by mouth daily before breakfast.    . ondansetron (ZOFRAN) 4 MG tablet Take 1 tablet (4 mg total) every 8 (eight) hours as needed by mouth for nausea or vomiting. 20 tablet 0  . pantoprazole (PROTONIX) 40 MG tablet Take 1 tablet (40 mg total) by mouth daily. 90 tablet 1  . Probiotic CAPS Take 1 capsule by mouth daily.    Marland Kitchen escitalopram (LEXAPRO) 20 MG tablet Take 1 tablet (20 mg total) by mouth at bedtime. 90 tablet 0  . Ascorbic Acid (VITAMIN C PO) Take 1 tablet by mouth daily.    . Multiple Vitamin (MULTIVITAMIN WITH MINERALS) TABS tablet Take 1 tablet by mouth daily.    Marland Kitchen oxyCODONE (OXY IR/ROXICODONE) 5 MG immediate release tablet Take 1 tablet (5 mg total) every 6 (six) hours as needed  by mouth for moderate pain, severe pain or breakthrough pain. (Patient taking differently: Take 2.5 mg by mouth at bedtime as needed for moderate pain, severe pain or breakthrough pain. ) 20 tablet 0   No facility-administered medications prior to visit.     Allergies  Allergen Reactions  .  Penicillins Anaphylaxis and Other (See Comments)    Has patient had a PCN reaction causing immediate rash, facial/tongue/throat swelling, SOB or lightheadedness with hypotension: yes Has patient had a PCN reaction causing severe rash involving mucus membranes or skin necrosis: no Has patient had a PCN reaction that required hospitalization: yes Has patient had a PCN reaction occurring within the last 10 years: no If all of the above answers are "NO", then may proceed with Cephalosporin use.        Objective:    BP 109/64 (BP Location: Right Arm, Cuff Size: Normal)   Pulse 66   Temp 98.5 F (36.9 C) (Oral)   Ht 5\' 6"  (1.676 m)   Wt 199 lb 3.2 oz (90.4 kg)   SpO2 94%   BMI 32.15 kg/m  Wt Readings from Last 3 Encounters:  02/01/17 199 lb 3.2 oz (90.4 kg)  01/04/17 199 lb (90.3 kg)  12/21/16 198 lb (89.8 kg)    Physical Exam  Constitutional: She is oriented to person, place, and time. She appears well-developed and well-nourished.  HENT:  Head: Normocephalic.  Eyes: EOM are normal.  Neck: Normal range of motion. No thyromegaly present.  Cardiovascular: Normal rate, regular rhythm and normal heart sounds. Exam reveals no gallop and no friction rub.  No murmur heard. Pulmonary/Chest: Effort normal and breath sounds normal.  Abdominal: Soft. Bowel sounds are normal.  Musculoskeletal: Normal range of motion.  Lymphadenopathy:    She has no cervical adenopathy.  Neurological: She is alert and oriented to person, place, and time.  Skin: Skin is warm and dry.  Psychiatric: She has a normal mood and affect. Her behavior is normal. Judgment and thought content normal.       Patient has been counseled extensively about nutrition and exercise as well as the importance of adherence with medications and regular follow-up. The patient was given clear instructions to go to ER or return to medical center if symptoms don't improve, worsen or new problems develop. The patient verbalized  understanding.   Follow-up: Return in about 3 months (around 05/02/2017) for anxiety and depression; Gildardo Pounds, FNP-BC [facility] [place], [place]   02/01/2017, 3:26 PM

## 2017-02-01 NOTE — H&P (View-Only) (Signed)
Assessment & Plan:  Andrea Blackwell was seen today for follow-up.  Diagnoses and all orders for this visit:  Anxiety and depression -     escitalopram (LEXAPRO) 20 MG tablet; Take 1 tablet (20 mg total) by mouth at bedtime.    Patient has been counseled on age-appropriate routine health concerns for screening and prevention. These are reviewed and up-to-date. Referrals have been placed accordingly. Immunizations are up-to-date or declined.    Subjective:   Chief Complaint  Patient presents with  . Follow-up    Patient is here for a follow-up. Patient stated she is in pain on her right abdomen due from surgery.    HPI Andrea Blackwell 61 y.o. female presents to office today for follow up to anxiety and depression.  Anxiety and Depression Currently going through a "messy divorce". We increased her lexapro from 10mg  to 20mg  a few weeks ago. She reports improved mood with the increase. We also discussed dietary and exercise modifications to improve mood. She denies SI/HI at this time.    Review of Systems  Constitutional: Positive for malaise/fatigue. Negative for chills, diaphoresis, fever and weight loss.  Respiratory: Negative.  Negative for cough, sputum production, shortness of breath and wheezing.   Cardiovascular: Negative.  Negative for chest pain, palpitations and leg swelling.  Gastrointestinal: Positive for abdominal pain. Negative for constipation, diarrhea, heartburn, melena and vomiting.  Musculoskeletal: Negative.   Neurological: Negative.  Negative for dizziness, tingling, sensory change, speech change, focal weakness, loss of consciousness, weakness and headaches.  Psychiatric/Behavioral: Positive for depression and memory loss. Negative for hallucinations, substance abuse and suicidal ideas. The patient is nervous/anxious. The patient does not have insomnia.     Past Medical History:  Diagnosis Date  . Hypothyroidism   . Pancreatitis     Past Surgical History:  Procedure  Laterality Date  . CHOLECYSTECTOMY    . ERCP N/A 12/10/2016   Procedure: ENDOSCOPIC RETROGRADE CHOLANGIOPANCREATOGRAPHY (ERCP);  Surgeon: Carol Ada, MD;  Location: [facility];  Service: Endoscopy;  Laterality: N/A;  . ERCP N/A 12/14/2016   Procedure: ENDOSCOPIC RETROGRADE CHOLANGIOPANCREATOGRAPHY (ERCP);  Surgeon: Milus Banister, MD;  Location: [facility];  Service: Endoscopy;  Laterality: N/A;  . LAPAROSCOPIC CHOLECYSTECTOMY SINGLE SITE WITH INTRAOPERATIVE CHOLANGIOGRAM N/A 12/13/2016   Procedure: LAPAROSCOPIC CHOLECYSTECTOMY SINGLE SITE AND LIVER BIOPSY;  Surgeon: Michael Boston, MD;  Location: [facility];  Service: General;  Laterality: N/A;  . OVARY SURGERY      History reviewed. No pertinent family history.  Social History Reviewed with no changes to be made today.   Outpatient Medications Prior to Visit  Medication Sig Dispense Refill  . levothyroxine (SYNTHROID, LEVOTHROID) 75 MCG tablet Take 75 mcg by mouth daily before breakfast.    . ondansetron (ZOFRAN) 4 MG tablet Take 1 tablet (4 mg total) every 8 (eight) hours as needed by mouth for nausea or vomiting. 20 tablet 0  . pantoprazole (PROTONIX) 40 MG tablet Take 1 tablet (40 mg total) by mouth daily. 90 tablet 1  . Probiotic CAPS Take 1 capsule by mouth daily.    Marland Kitchen escitalopram (LEXAPRO) 20 MG tablet Take 1 tablet (20 mg total) by mouth at bedtime. 90 tablet 0  . Ascorbic Acid (VITAMIN C PO) Take 1 tablet by mouth daily.    . Multiple Vitamin (MULTIVITAMIN WITH MINERALS) TABS tablet Take 1 tablet by mouth daily.    Marland Kitchen oxyCODONE (OXY IR/ROXICODONE) 5 MG immediate release tablet Take 1 tablet (5 mg total) every 6 (six) hours as needed  by mouth for moderate pain, severe pain or breakthrough pain. (Patient taking differently: Take 2.5 mg by mouth at bedtime as needed for moderate pain, severe pain or breakthrough pain. ) 20 tablet 0   No facility-administered medications prior to visit.     Allergies  Allergen Reactions  .  Penicillins Anaphylaxis and Other (See Comments)    Has patient had a PCN reaction causing immediate rash, facial/tongue/throat swelling, SOB or lightheadedness with hypotension: yes Has patient had a PCN reaction causing severe rash involving mucus membranes or skin necrosis: no Has patient had a PCN reaction that required hospitalization: yes Has patient had a PCN reaction occurring within the last 10 years: no If all of the above answers are "NO", then may proceed with Cephalosporin use.        Objective:    BP 109/64 (BP Location: Right Arm, Cuff Size: Normal)   Pulse 66   Temp 98.5 F (36.9 C) (Oral)   Ht 5\' 6"  (1.676 m)   Wt 199 lb 3.2 oz (90.4 kg)   SpO2 94%   BMI 32.15 kg/m  Wt Readings from Last 3 Encounters:  02/01/17 199 lb 3.2 oz (90.4 kg)  01/04/17 199 lb (90.3 kg)  12/21/16 198 lb (89.8 kg)    Physical Exam  Constitutional: She is oriented to person, place, and time. She appears well-developed and well-nourished.  HENT:  Head: Normocephalic.  Eyes: EOM are normal.  Neck: Normal range of motion. No thyromegaly present.  Cardiovascular: Normal rate, regular rhythm and normal heart sounds. Exam reveals no gallop and no friction rub.  No murmur heard. Pulmonary/Chest: Effort normal and breath sounds normal.  Abdominal: Soft. Bowel sounds are normal.  Musculoskeletal: Normal range of motion.  Lymphadenopathy:    She has no cervical adenopathy.  Neurological: She is alert and oriented to person, place, and time.  Skin: Skin is warm and dry.  Psychiatric: She has a normal mood and affect. Her behavior is normal. Judgment and thought content normal.       Patient has been counseled extensively about nutrition and exercise as well as the importance of adherence with medications and regular follow-up. The patient was given clear instructions to go to ER or return to medical center if symptoms don't improve, worsen or new problems develop. The patient verbalized  understanding.   Follow-up: Return in about 3 months (around 05/02/2017) for anxiety and depression; Gildardo Pounds, FNP-BC [facility] and [place], [place]   02/01/2017, 3:26 PM

## 2017-02-02 ENCOUNTER — Ambulatory Visit (HOSPITAL_COMMUNITY): Payer: Self-pay | Admitting: Certified Registered Nurse Anesthetist

## 2017-02-02 ENCOUNTER — Other Ambulatory Visit: Payer: Self-pay

## 2017-02-02 ENCOUNTER — Encounter (HOSPITAL_COMMUNITY)
Admission: RE | Disposition: A | Discharge status: Home / Self Care | Payer: Self-pay | Source: Ambulatory Visit | Attending: Gastroenterology

## 2017-02-02 ENCOUNTER — Encounter (HOSPITAL_COMMUNITY): Payer: Self-pay

## 2017-02-02 ENCOUNTER — Ambulatory Visit (HOSPITAL_COMMUNITY)
Admission: RE | Admit: 2017-02-02 | Discharge: 2017-02-02 | Disposition: A | Discharge status: Home / Self Care | Payer: Self-pay | Source: Ambulatory Visit | Attending: Gastroenterology | Admitting: Gastroenterology

## 2017-02-02 ENCOUNTER — Ambulatory Visit (HOSPITAL_COMMUNITY): Payer: Self-pay

## 2017-02-02 DIAGNOSIS — Z880 Allergy status to penicillin: Secondary | ICD-10-CM | POA: Insufficient documentation

## 2017-02-02 DIAGNOSIS — Z4689 Encounter for fitting and adjustment of other specified devices: Secondary | ICD-10-CM

## 2017-02-02 DIAGNOSIS — K219 Gastro-esophageal reflux disease without esophagitis: Secondary | ICD-10-CM | POA: Insufficient documentation

## 2017-02-02 DIAGNOSIS — Z79899 Other long term (current) drug therapy: Secondary | ICD-10-CM | POA: Insufficient documentation

## 2017-02-02 DIAGNOSIS — E039 Hypothyroidism, unspecified: Secondary | ICD-10-CM | POA: Insufficient documentation

## 2017-02-02 DIAGNOSIS — K8050 Calculus of bile duct without cholangitis or cholecystitis without obstruction: Secondary | ICD-10-CM | POA: Insufficient documentation

## 2017-02-02 DIAGNOSIS — R17 Unspecified jaundice: Secondary | ICD-10-CM

## 2017-02-02 DIAGNOSIS — K838 Other specified diseases of biliary tract: Secondary | ICD-10-CM

## 2017-02-02 DIAGNOSIS — Z6832 Body mass index (BMI) 32.0-32.9, adult: Secondary | ICD-10-CM | POA: Insufficient documentation

## 2017-02-02 DIAGNOSIS — Z79891 Long term (current) use of opiate analgesic: Secondary | ICD-10-CM | POA: Insufficient documentation

## 2017-02-02 DIAGNOSIS — F418 Other specified anxiety disorders: Secondary | ICD-10-CM | POA: Insufficient documentation

## 2017-02-02 DIAGNOSIS — K851 Biliary acute pancreatitis without necrosis or infection: Secondary | ICD-10-CM

## 2017-02-02 HISTORY — DX: Acute pancreatitis without necrosis or infection, unspecified: K85.90

## 2017-02-02 HISTORY — PX: ENDOSCOPIC RETROGRADE CHOLANGIOPANCREATOGRAPHY (ERCP) WITH PROPOFOL: SHX5810

## 2017-02-02 LAB — HEPATIC FUNCTION PANEL
ALK PHOS: 64 U/L (ref 38–126)
ALT: 15 U/L (ref 14–54)
AST: 16 U/L (ref 15–41)
Albumin: 3.1 g/dL — ABNORMAL LOW (ref 3.5–5.0)
BILIRUBIN DIRECT: 0.2 mg/dL (ref 0.1–0.5)
BILIRUBIN TOTAL: 0.8 mg/dL (ref 0.3–1.2)
Indirect Bilirubin: 0.6 mg/dL (ref 0.3–0.9)
TOTAL PROTEIN: 5.8 g/dL — AB (ref 6.5–8.1)

## 2017-02-02 SURGERY — ENDOSCOPIC RETROGRADE CHOLANGIOPANCREATOGRAPHY (ERCP) WITH PROPOFOL
Anesthesia: General

## 2017-02-02 MED ORDER — CIPROFLOXACIN IN D5W 400 MG/200ML IV SOLN
INTRAVENOUS | Status: AC
  Filled 2017-02-02: qty 200

## 2017-02-02 MED ORDER — DEXAMETHASONE SODIUM PHOSPHATE 10 MG/ML IJ SOLN
INTRAMUSCULAR | Status: DC | PRN
  Administered 2017-02-02: 10 mg via INTRAVENOUS

## 2017-02-02 MED ORDER — PROPOFOL 10 MG/ML IV BOLUS
INTRAVENOUS | Status: DC | PRN
  Administered 2017-02-02: 150 mg via INTRAVENOUS

## 2017-02-02 MED ORDER — INDOMETHACIN 50 MG RE SUPP
RECTAL | Status: DC | PRN
Start: 2017-02-02 — End: 2017-02-02
  Administered 2017-02-02: 100 mg via RECTAL

## 2017-02-02 MED ORDER — MIDAZOLAM HCL 5 MG/5ML IJ SOLN
INTRAMUSCULAR | Status: DC | PRN
  Administered 2017-02-02: 2 mg via INTRAVENOUS

## 2017-02-02 MED ORDER — FENTANYL CITRATE (PF) 100 MCG/2ML IJ SOLN
INTRAMUSCULAR | Status: DC | PRN
  Administered 2017-02-02: 100 ug via INTRAVENOUS

## 2017-02-02 MED ORDER — MIDAZOLAM HCL 2 MG/2ML IJ SOLN
INTRAMUSCULAR | Status: AC
  Filled 2017-02-02: qty 2

## 2017-02-02 MED ORDER — SODIUM CHLORIDE 0.9 % IV SOLN
INTRAVENOUS | Status: DC | PRN
  Administered 2017-02-02: 25 mL

## 2017-02-02 MED ORDER — SUCCINYLCHOLINE CHLORIDE 200 MG/10ML IV SOSY
PREFILLED_SYRINGE | INTRAVENOUS | Status: DC | PRN
  Administered 2017-02-02: 100 mg via INTRAVENOUS

## 2017-02-02 MED ORDER — LACTATED RINGERS IV SOLN
INTRAVENOUS | Status: DC
  Administered 2017-02-02: 07:00:00 via INTRAVENOUS

## 2017-02-02 MED ORDER — SCOPOLAMINE 1 MG/3DAYS TD PT72
MEDICATED_PATCH | TRANSDERMAL | Status: AC
  Filled 2017-02-02: qty 1

## 2017-02-02 MED ORDER — GLUCAGON HCL RDNA (DIAGNOSTIC) 1 MG IJ SOLR
INTRAMUSCULAR | Status: AC
  Filled 2017-02-02: qty 1

## 2017-02-02 MED ORDER — EPHEDRINE SULFATE-NACL 50-0.9 MG/10ML-% IV SOSY
PREFILLED_SYRINGE | INTRAVENOUS | Status: DC | PRN
  Administered 2017-02-02: 15 mg via INTRAVENOUS

## 2017-02-02 MED ORDER — PROPOFOL 10 MG/ML IV BOLUS
INTRAVENOUS | Status: AC
  Filled 2017-02-02: qty 20

## 2017-02-02 MED ORDER — FENTANYL CITRATE (PF) 100 MCG/2ML IJ SOLN
INTRAMUSCULAR | Status: AC
  Filled 2017-02-02: qty 2

## 2017-02-02 MED ORDER — ONDANSETRON HCL 4 MG/2ML IJ SOLN
INTRAMUSCULAR | Status: DC | PRN
  Administered 2017-02-02: 4 mg via INTRAVENOUS

## 2017-02-02 MED ORDER — SODIUM CHLORIDE 0.9 % IV SOLN: INTRAVENOUS | Status: DC

## 2017-02-02 MED ORDER — INDOMETHACIN 50 MG RE SUPP
RECTAL | Status: AC
  Filled 2017-02-02: qty 2

## 2017-02-02 MED ORDER — LIDOCAINE 2% (20 MG/ML) 5 ML SYRINGE
INTRAMUSCULAR | Status: DC | PRN
  Administered 2017-02-02: 80 mg via INTRAVENOUS

## 2017-02-02 MED ORDER — CIPROFLOXACIN IN D5W 400 MG/200ML IV SOLN
INTRAVENOUS | Status: DC | PRN
  Administered 2017-02-02: 400 mg via INTRAVENOUS

## 2017-02-02 NOTE — Discharge Instructions (Signed)

## 2017-02-02 NOTE — Anesthesia Postprocedure Evaluation (Signed)
Anesthesia Post Note  Patient: Andrea Blackwell  Procedure(s) Performed: ENDOSCOPIC RETROGRADE CHOLANGIOPANCREATOGRAPHY (ERCP) WITH PROPOFOL (N/A )     Patient location during evaluation: PACU Anesthesia Type: General Level of consciousness: awake and alert, oriented and patient cooperative Pain management: pain level controlled Vital Signs Assessment: post-procedure vital signs reviewed and stable Respiratory status: spontaneous breathing, nonlabored ventilation and respiratory function stable Cardiovascular status: blood pressure returned to baseline and stable Postop Assessment: no apparent nausea or vomiting Anesthetic complications: no    Last Vitals:  Vitals:   02/02/17 0819 02/02/17 0835  BP: (!) 180/58 (!) 191/66  Pulse:  68  Resp: 18 14  Temp: 36.4 C   SpO2: 100% 100%    Last Pain:  Vitals:   02/02/17 0819  TempSrc: Oral                 Tremaine Earwood,E. Erbie Arment

## 2017-02-02 NOTE — Anesthesia Procedure Notes (Signed)
Procedure Name: Intubation Date/Time: 02/02/2017 7:36 AM Performed by: Montel Clock, CRNA Pre-anesthesia Checklist: Patient identified, Emergency Drugs available, Suction available, Patient being monitored and Timeout performed Patient Re-evaluated:Patient Re-evaluated prior to induction Oxygen Delivery Method: Circle system utilized Preoxygenation: Pre-oxygenation with 100% oxygen Induction Type: IV induction Ventilation: Mask ventilation without difficulty Laryngoscope Size: Mac and 3 Grade View: Grade II Tube type: Oral Tube size: 7.0 mm Number of attempts: 1 Airway Equipment and Method: Stylet Placement Confirmation: ETT inserted through vocal cords under direct vision,  positive ETCO2 and breath sounds checked- equal and bilateral Secured at: 22 cm Tube secured with: Tape Dental Injury: Teeth and Oropharynx as per pre-operative assessment  Comments: Grade 2b view, very bottom of cords visible with cricoid pressure.

## 2017-02-02 NOTE — Transfer of Care (Signed)
Immediate Anesthesia Transfer of Care Note  Patient: Andrea Blackwell  Procedure(s) Performed: ENDOSCOPIC RETROGRADE CHOLANGIOPANCREATOGRAPHY (ERCP) WITH PROPOFOL (N/A )  Patient Location: Endoscopy Unit  Anesthesia Type:General  Level of Consciousness: awake, alert  and patient cooperative  Airway & Oxygen Therapy: Patient Spontanous Breathing and Patient connected to face mask oxygen  Post-op Assessment: Report given to RN and Post -op Vital signs reviewed and stable  Post vital signs: Reviewed and stable  Last Vitals:  Vitals:   02/02/17 0624 02/02/17 0819  BP: (!) 132/54 (!) 180/58  Pulse: (!) 59   Resp: 11 18  Temp: 36.8 C   SpO2: 98% 100%    Last Pain:  Vitals:   02/02/17 0819  TempSrc: Oral         Complications: No apparent anesthesia complications

## 2017-02-02 NOTE — Interval H&P Note (Signed)
History and Physical Interval Note:  02/02/2017 7:07 AM  Andrea Blackwell  has presented today for surgery, with the diagnosis of stent removal  The various methods of treatment have been discussed with the patient and family. After consideration of risks, benefits and other options for treatment, the patient has consented to  Procedure(s): ENDOSCOPIC RETROGRADE CHOLANGIOPANCREATOGRAPHY (ERCP) WITH PROPOFOL (N/A) as a surgical intervention .  The patient's history has been reviewed, patient examined, no change in status, stable for surgery.  I have reviewed the patient's chart and labs.  Questions were answered to the patient's satisfaction.     Milus Banister

## 2017-02-02 NOTE — Op Note (Signed)
[facility] Patient Name: Andrea Blackwell Procedure Date: 02/02/2017 MRN: [id number] Attending MD: Milus Banister , MD Date of Birth: [birth year] CSN: [id number] Age: 61 Admit Type: Outpatient Procedure:                ERCP Indications:              Follow-up of bile leak: ERCP Dr. Benson Norway for CBD                            stones (sphincterotomy and stone removal), followed                            by lap chole Dr. Johney Maine with high suspicion of bile                            duct leak, followed by ERCP Dr. Ardis Hughs placement of                            pastic biliary stent (no overt leak observed) Providers:                Milus Banister, MD, Cleda Daub, RN, William Dalton, Technician Referring MD:              Medicines:                General Anesthesia, Indomethacin 100 mg PR, Cipro                            629 mg IV Complications:            No immediate complications. Estimated blood loss:                            None Estimated Blood Loss:     Estimated blood loss: none. Procedure:                Pre-Anesthesia Assessment:                           - Prior to the procedure, a History and Physical                            was performed, and patient medications and                            allergies were reviewed. The patient's tolerance of                            previous anesthesia was also reviewed. The risks                            and benefits of the procedure and the sedation  options and risks were discussed with the patient.                            All questions were answered, and informed consent                            was obtained. Prior Anticoagulants: The patient has                            taken no previous anticoagulant or antiplatelet                            agents. ASA Grade Assessment: II - A patient with                            mild systemic disease. After  reviewing the risks                            and benefits, the patient was deemed in                            satisfactory condition to undergo the procedure.                           After obtaining informed consent, the scope was                            passed under direct vision. Throughout the                            procedure, the patient's blood pressure, pulse, and                            oxygen saturations were monitored continuously. The                            Duodenoscope was introduced through the mouth, and                            used to inject contrast into and used to cannulate                            the bile duct. The ERCP was accomplished without                            difficulty. The patient tolerated the procedure                            well. Scope In: Scope Out: Findings:      A biliary stent and clips in the RUQ were noted on the scout film. The       previously placed biliary stent was extending into the duodenum, across       the major papilla in good position and  it was removed with a snare.       There was clear evidence of previous biliary sphincterotomy. A 44       Autotome over a .035 hydrawire was used to cannulate the bile duct and       contrast was injected. Chlangiogram revealed slightly slightly dilated       extrahepatic biliary tree (CBD 6-53mm). There appeared to be dye filling       the cystic duct stump adjacent to the common hepatic duct. There was no       convincing extravasation of contrast. I used a 62mm retrieval balloon to       sweep the duct several times, delivering a small amount of bio-debris       into the duodenum. A completion, occlusion cholangiogram again did not       show bile leak extravasation. The pancreatic duct was never cannulated       with a wire or injected with dye. Impression:               - The previously placed plastic biliary stent was                            removed with a snare.  Cholangiogram showed no sign                            of persistent/recurrent bile duct leak Moderate Sedation:      N/A- Per Anesthesia Care Recommendation:           - Discharge patient to home (ambulatory).                           - Follow up with GI as needed. Procedure Code(s):        --- Professional ---                           [phone], Endoscopic retrograde                            cholangiopancreatography (ERCP); with removal of                            foreign body(s) or stent(s) from biliary/pancreatic                            duct(s) Diagnosis Code(s):        --- Professional ---                           X54.00, Encounter for fitting and adjustment of                            other gastrointestinal appliance and device                           K83.8, Other specified diseases of biliary tract CPT copyright 2016 [employer]. All rights reserved. The codes documented in this report are preliminary and upon coder review may  be revised to meet current compliance requirements. Milus Banister, MD  02/02/2017 8:16:30 AM This report has been signed electronically. Number of Addenda: 0

## 2017-02-03 ENCOUNTER — Encounter (HOSPITAL_COMMUNITY): Payer: Self-pay | Admitting: Gastroenterology

## 2017-02-28 ENCOUNTER — Ambulatory Visit (AMBULATORY_SURGERY_CENTER): Payer: Self-pay

## 2017-02-28 ENCOUNTER — Other Ambulatory Visit: Payer: Self-pay

## 2017-02-28 VITALS — Ht 66.0 in | Wt 197.0 lb

## 2017-02-28 DIAGNOSIS — Z1211 Encounter for screening for malignant neoplasm of colon: Secondary | ICD-10-CM

## 2017-02-28 MED ORDER — NA SULFATE-K SULFATE-MG SULF 17.5-3.13-1.6 GM/177ML PO SOLN: 1.0000 | Freq: Once | ORAL | 0 refills | Status: AC

## 2017-02-28 MED FILL — ?PANTOPRAZOLE SOD DR 40MG: 40 MG | 30 days supply | Qty: 30 | Fill #2

## 2017-02-28 MED FILL — SUPREP BOWEL PREP KIT: 17.5-3.13-1 | 1 days supply | Qty: 354 | Fill #0

## 2017-02-28 MED FILL — ESCITALOPRAM 20 MG TABLET: 20 | 30 days supply | Qty: 30 | Fill #2

## 2017-02-28 NOTE — Progress Notes (Signed)
Denies allergies to eggs or soy products. Denies complication of anesthesia or sedation. Denies use of weight loss medication. Denies use of O2.   Emmi instructions declined.  

## 2017-03-01 ENCOUNTER — Encounter: Payer: Self-pay | Admitting: Nurse Practitioner

## 2017-03-01 ENCOUNTER — Ambulatory Visit: Payer: Self-pay | Attending: Nurse Practitioner | Admitting: Nurse Practitioner

## 2017-03-01 VITALS — BP 114/71 | HR 68 | Temp 98.2°F | Resp 12 | Ht 63.0 in | Wt 201.0 lb

## 2017-03-01 DIAGNOSIS — Z9049 Acquired absence of other specified parts of digestive tract: Secondary | ICD-10-CM | POA: Insufficient documentation

## 2017-03-01 DIAGNOSIS — Z6835 Body mass index (BMI) 35.0-35.9, adult: Secondary | ICD-10-CM | POA: Insufficient documentation

## 2017-03-01 DIAGNOSIS — F419 Anxiety disorder, unspecified: Secondary | ICD-10-CM | POA: Insufficient documentation

## 2017-03-01 DIAGNOSIS — E039 Hypothyroidism, unspecified: Secondary | ICD-10-CM | POA: Insufficient documentation

## 2017-03-01 DIAGNOSIS — Z79899 Other long term (current) drug therapy: Secondary | ICD-10-CM | POA: Insufficient documentation

## 2017-03-01 DIAGNOSIS — E669 Obesity, unspecified: Secondary | ICD-10-CM | POA: Insufficient documentation

## 2017-03-01 DIAGNOSIS — F329 Major depressive disorder, single episode, unspecified: Secondary | ICD-10-CM | POA: Insufficient documentation

## 2017-03-01 DIAGNOSIS — Z79890 Hormone replacement therapy (postmenopausal): Secondary | ICD-10-CM | POA: Insufficient documentation

## 2017-03-01 DIAGNOSIS — K219 Gastro-esophageal reflux disease without esophagitis: Secondary | ICD-10-CM | POA: Insufficient documentation

## 2017-03-01 DIAGNOSIS — B9789 Other viral agents as the cause of diseases classified elsewhere: Secondary | ICD-10-CM | POA: Insufficient documentation

## 2017-03-01 DIAGNOSIS — L989 Disorder of the skin and subcutaneous tissue, unspecified: Secondary | ICD-10-CM | POA: Insufficient documentation

## 2017-03-01 DIAGNOSIS — Z880 Allergy status to penicillin: Secondary | ICD-10-CM | POA: Insufficient documentation

## 2017-03-01 DIAGNOSIS — J019 Acute sinusitis, unspecified: Secondary | ICD-10-CM | POA: Insufficient documentation

## 2017-03-01 MED ORDER — TRIAMCINOLONE ACETONIDE 0.5 % EX OINT: 1.0000 "application " | TOPICAL_OINTMENT | Freq: Two times a day (BID) | CUTANEOUS | 0 refills | Status: DC

## 2017-03-01 MED ORDER — FLUTICASONE PROPIONATE 50 MCG/ACT NA SUSP: 2.0000 | Freq: Every day | NASAL | 6 refills | Status: DC

## 2017-03-01 MED FILL — FLUTICASONE PROP 50 MCG SPR: 50 | 30 days supply | Qty: 16 | Fill #0

## 2017-03-01 MED FILL — TRIAMCINOLONE 0.5% OINTMENT: 0.5 | 15 days supply | Qty: 30 | Fill #0

## 2017-03-01 NOTE — Patient Instructions (Addendum)
Postnasal Drip Postnasal drip is the feeling of mucus going down the back of your throat. Mucus is a slimy substance that moistens and cleans your nose and throat, as well as the air pockets in face bones near your forehead and cheeks (sinuses). Small amounts of mucus pass from your nose and sinuses down the back of your throat all the time. This is normal. When you produce too much mucus or the mucus gets too thick, you can feel it. Some common causes of postnasal drip include:  Having more mucus because of: ? A cold or the flu. ? Allergies. ? Cold air. ? Certain medicines.  Having more mucus that is thicker because of: ? A sinus or nasal infection. ? Dry air. ? A food allergy.  Follow these instructions at home: Relieving discomfort  Gargle with a salt-water mixture 3-4 times a day or as needed. To make a salt-water mixture, completely dissolve -1 tsp of salt in 1 cup of warm water.  If the air in your home is dry, use a humidifier to add moisture to the air.  Use a saline spray or container (neti pot) to flush out the nose (nasal irrigation). These methods can help clear away mucus and keep the nasal passages moist. General instructions  Take over-the-counter and prescription medicines only as told by your health care provider.  Follow instructions from your health care provider about eating or drinking restrictions. You may need to avoid caffeine.  Avoid things that you know you are allergic to (allergens), like dust, mold, pollen, pets, or certain foods.  Drink enough fluid to keep your urine pale yellow.  Keep all follow-up visits as told by your health care provider. This is important. Contact a health care provider if:  You have a fever.  You have a sore throat.  You have difficulty swallowing.  You have headache.  You have sinus pain.  You have a cough that does not go away.  The mucus from your nose becomes thick and is green or yellow in color.  You have  cold or flu symptoms that last more than 10 days. Summary  Postnasal drip is the feeling of mucus going down the back of your throat.  If your health care provider approves, use nasal irrigation or a nasal spray 2?4 times a day.  Avoid things that you know you are allergic to (allergens), like dust, mold, pollen, pets, or certain foods. This information is not intended to replace advice given to you by your health care provider. Make sure you discuss any questions you have with your health care provider. Document Released: 05/09/2016 Document Revised: 05/09/2016 Document Reviewed: 05/09/2016 Elsevier Interactive Patient Education  2018 [employer].  Sinus Headache A sinus headache occurs when the paranasal sinuses become clogged or swollen. Paranasal sinuses are air pockets within the bones of the face. Sinus headaches can range from mild to severe. What are the causes? A sinus headache can result from various conditions that affect the sinuses, such as:  Colds.  Sinus infections.  Allergies.  What are the signs or symptoms? The main symptom of this condition is a headache that may feel like pain or pressure in the face, forehead, ears, or upper teeth. People who have a sinus headache often have other symptoms, such as:  Congested or runny nose.  Fever.  Inability to smell.  Weather changes can make symptoms worse. How is this diagnosed? This condition may be diagnosed based on:  A physical exam and  medical history.  Imaging tests, such as a CT scan and MRI, to check for problems with the sinuses.  A specialist may look into the sinuses with a tool that has a camera (endoscopy).  How is this treated? Treatment for this condition depends on the cause.  Sinus pain that is caused by a sinus infection may be treated with antibiotic medicine.  Sinus pain that is caused by allergies may be helped by allergy medicines (antihistamines) and medicated nasal sprays.  Sinus  pain that is caused by congestion may be helped by flushing the nose and sinuses with saline solution.  Follow these instructions at home:  Take medicines only as directed by your health care provider.  If you were prescribed an antibiotic medicine, finish all of it even if you start to feel better.  If you have congestion, use a nasal spray to help reduce pressure.  If directed, apply a warm, moist washcloth to your face to help relieve pain. Contact a health care provider if:  You have headaches more than one time each week.  You have sensitivity to light or sound.  You have a fever.  You feel sick to your stomach (nauseous) or you throw up (vomit).  Your headaches do not get better with treatment. Many people think that they have a sinus headache when they actually have migraines or tension headaches. Get help right away if:  You have vision problems.  You have sudden, severe pain in your face or head.  You have a seizure.  You are confused.  You have a stiff neck. This information is not intended to replace advice given to you by your health care provider. Make sure you discuss any questions you have with your health care provider. Document Released: 03/03/2004 Document Revised: 09/20/2015 Document Reviewed: 01/20/2014 Elsevier Interactive Patient Education  2018 [employer].  Sinusitis, Adult Sinusitis is soreness and inflammation of your sinuses. Sinuses are hollow spaces in the bones around your face. Your sinuses are located:  Around your eyes.  In the middle of your forehead.  Behind your nose.  In your cheekbones.  Your sinuses and nasal passages are lined with a stringy fluid (mucus). Mucus normally drains out of your sinuses. When your nasal tissues become inflamed or swollen, the mucus can become trapped or blocked so air cannot flow through your sinuses. This allows bacteria, viruses, and funguses to grow, which leads to infection. Sinusitis can  develop quickly and last for 7?10 days (acute) or for more than 12 weeks (chronic). Sinusitis often develops after a cold. What are the causes? This condition is caused by anything that creates swelling in the sinuses or stops mucus from draining, including:  Allergies.  Asthma.  Bacterial or viral infection.  Abnormally shaped bones between the nasal passages.  Nasal growths that contain mucus (nasal polyps).  Narrow sinus openings.  Pollutants, such as chemicals or irritants in the air.  A foreign object stuck in the nose.  A fungal infection. This is rare.  What increases the risk? The following factors may make you more likely to develop this condition:  Having allergies or asthma.  Having had a recent cold or respiratory tract infection.  Having structural deformities or blockages in your nose or sinuses.  Having a weak immune system.  Doing a lot of swimming or diving.  Overusing nasal sprays.  Smoking.  What are the signs or symptoms? The main symptoms of this condition are pain and a feeling of pressure around  the affected sinuses. Other symptoms include:  Upper toothache.  Earache.  Headache.  Bad breath.  Decreased sense of smell and taste.  A cough that may get worse at night.  Fatigue.  Fever.  Thick drainage from your nose. The drainage is often green and it may contain pus (purulent).  Stuffy nose or congestion.  Postnasal drip. This is when extra mucus collects in the throat or back of the nose.  Swelling and warmth over the affected sinuses.  Sore throat.  Sensitivity to light.  How is this diagnosed? This condition is diagnosed based on symptoms, a medical history, and a physical exam. To find out if your condition is acute or chronic, your health care provider may:  Look in your nose for signs of nasal polyps.  Tap over the affected sinus to check for signs of infection.  View the inside of your sinuses using an imaging  device that has a light attached (endoscope).  If your health care provider suspects that you have chronic sinusitis, you may also:  Be tested for allergies.  Have a sample of mucus taken from your nose (nasal culture) and checked for bacteria.  Have a mucus sample examined to see if your sinusitis is related to an allergy.  If your sinusitis does not respond to treatment and it lasts longer than 8 weeks, you may have an MRI or CT scan to check your sinuses. These scans also help to determine how severe your infection is. In rare cases, a bone biopsy may be done to rule out more serious types of fungal sinus disease. How is this treated? Treatment for sinusitis depends on the cause and whether your condition is chronic or acute. If a virus is causing your sinusitis, your symptoms will go away on their own within 10 days. You may be given medicines to relieve your symptoms, including:  Topical nasal decongestants. They shrink swollen nasal passages and let mucus drain from your sinuses.  Antihistamines. These drugs block inflammation that is triggered by allergies. This can help to ease swelling in your nose and sinuses.  Topical nasal corticosteroids. These are nasal sprays that ease inflammation and swelling in your nose and sinuses.  Nasal saline washes. These rinses can help to get rid of thick mucus in your nose.  If your condition is caused by bacteria, you will be given an antibiotic medicine. If your condition is caused by a fungus, you will be given an antifungal medicine. Surgery may be needed to correct underlying conditions, such as narrow nasal passages. Surgery may also be needed to remove polyps. Follow these instructions at home: Medicines  Take, use, or apply over-the-counter and prescription medicines only as told by your health care provider. These may include nasal sprays.  If you were prescribed an antibiotic medicine, take it as told by your health care provider. Do  not stop taking the antibiotic even if you start to feel better. Hydrate and Humidify  Drink enough water to keep your urine clear or pale yellow. Staying hydrated will help to thin your mucus.  Use a cool mist humidifier to keep the humidity level in your home above 50%.  Inhale steam for 10-15 minutes, 3-4 times a day or as told by your health care provider. You can do this in the bathroom while a hot shower is running.  Limit your exposure to cool or dry air. Rest  Rest as much as possible.  Sleep with your head raised (elevated).  Make sure  to get enough sleep each night. General instructions  Apply a warm, moist washcloth to your face 3-4 times a day or as told by your health care provider. This will help with discomfort.  Wash your hands often with soap and water to reduce your exposure to viruses and other germs. If soap and water are not available, use hand sanitizer.  Do not smoke. Avoid being around people who are smoking (secondhand smoke).  Keep all follow-up visits as told by your health care provider. This is important. Contact a health care provider if:  You have a fever.  Your symptoms get worse.  Your symptoms do not improve within 10 days. Get help right away if:  You have a severe headache.  You have persistent vomiting.  You have pain or swelling around your face or eyes.  You have vision problems.  You develop confusion.  Your neck is stiff.  You have trouble breathing. This information is not intended to replace advice given to you by your health care provider. Make sure you discuss any questions you have with your health care provider. Document Released: 01/24/2005 Document Revised: 09/20/2015 Document Reviewed: 11/19/2014 Elsevier Interactive Patient Education  [employer].

## 2017-03-01 NOTE — Progress Notes (Signed)
Assessment & Plan:  Andrea Blackwell was seen today for sinusitis.  Diagnoses and all orders for this visit:  Acute viral sinusitis -     fluticasone (FLONASE) 50 MCG/ACT nasal spray; Place 2 sprays into both nostrils daily. May use humidifier for nasal congestion May alternate acetaminophen with ibuprofen for pain and fever relief. Remember to stay hydrated and drink plenty of water.  Wash hands frequently with soap and water to help prevent the spread of infection.   Skin lesion -     triamcinolone ointment (KENALOG) 0.5 %; Apply 1 application topically 2 (two) times daily. -     Ambulatory referral to Dermatology  Obesity (BMI 30-39.9) Discussed diet and exercise for person with BMI >25. Instructed: You must burn more calories than you eat. Losing 5 percent of your body weight should be considered a success. In the longer term, losing more than 15 percent of your body weight and staying at this weight is an extremely good result. However, keep in mind that even losing 5 percent of your body weight leads to important health benefits, so try not to get discouraged if you're not able to lose more than this. Will recheck weight in 3-6 months.  Patient has been counseled on age-appropriate routine health concerns for screening and prevention. These are reviewed and up-to-date. Referrals have been placed accordingly. Immunizations are up-to-date or declined.    Subjective:   Chief Complaint  Patient presents with  . Sinusitis    Patient stated she have a bad headaches mostly at night, nasal drainage, pressure under her eyes, and diarrhea. Patient stated it started over the weekend and try OTC medication to help. Patient is concern with  a bruise on her left thigh and it's been there for 3 months already.    HPI Andrea Blackwell 62 y.o. female presents to office today with complaints of upper respiratory symptoms.     Sinusitis Patient presents with symptoms of what she believes to be a sinus  infection.   Her symptoms include fluid "and crusting" in her ears, feeling like her head is in a cloud, nausea and diarrhea x 2 days (now resolved), exhaustion, dizziness, difficulty sleeping, rhinorrhea.  She reports her roommate was recently diagnosed with a sinus infection and was given antibiotics and she would like antibiotics as well. She denies any fever. Onset of symptoms was 3 days ago, unchanged since that time. She also c/o sinus pressure for the past 3 days.  She is drinking plenty of fluids. Evaluation to date: none. Treatment to date: cough suppressants and decongestants. She has been taking zyrtec D and OTC acetominophen flu and cold.   Skin Lesion She reports a macular lesion with onset 3 months ago. Lesion has increased in size and is dark in color. She denies any injury to the area or previous history of rashes or lesions. She denies any bleeding or pain.   Review of Systems  Constitutional: Positive for chills and malaise/fatigue. Negative for fever.  HENT: Positive for congestion, sinus pain and sore throat. Negative for ear discharge, ear pain and hearing loss.   Eyes: Negative.  Negative for blurred vision.  Respiratory: Positive for cough. Negative for sputum production, shortness of breath and wheezing.   Cardiovascular: Negative.  Negative for chest pain, orthopnea and leg swelling.  Gastrointestinal: Positive for diarrhea and nausea. Negative for abdominal pain and vomiting.  Musculoskeletal: Positive for myalgias.  Skin: Positive for rash. Negative for itching.  Neurological: Positive for dizziness and headaches.  Negative for focal weakness.  Endo/Heme/Allergies: Negative for environmental allergies.  Psychiatric/Behavioral: The patient has insomnia.     Past Medical History:  Diagnosis Date  . Allergy   . Anxiety   . Cataract   . Depression   . GERD (gastroesophageal reflux disease)   . Hypothyroidism   . Pancreatitis     Past Surgical History:  Procedure  Laterality Date  . CHOLECYSTECTOMY    . ENDOSCOPIC RETROGRADE CHOLANGIOPANCREATOGRAPHY (ERCP) WITH PROPOFOL N/A 02/02/2017   Procedure: ENDOSCOPIC RETROGRADE CHOLANGIOPANCREATOGRAPHY (ERCP) WITH PROPOFOL;  Surgeon: Milus Banister, MD;  Location: [facility] ENDOSCOPY;  Service: Endoscopy;  Laterality: N/A;  . ERCP N/A 12/10/2016   Procedure: ENDOSCOPIC RETROGRADE CHOLANGIOPANCREATOGRAPHY (ERCP);  Surgeon: Carol Ada, MD;  Location: [facility];  Service: Endoscopy;  Laterality: N/A;  . ERCP N/A 12/14/2016   Procedure: ENDOSCOPIC RETROGRADE CHOLANGIOPANCREATOGRAPHY (ERCP);  Surgeon: Milus Banister, MD;  Location: [facility];  Service: Endoscopy;  Laterality: N/A;  . LAPAROSCOPIC CHOLECYSTECTOMY SINGLE SITE WITH INTRAOPERATIVE CHOLANGIOGRAM N/A 12/13/2016   Procedure: LAPAROSCOPIC CHOLECYSTECTOMY SINGLE SITE AND LIVER BIOPSY;  Surgeon: Michael Boston, MD;  Location: [facility] ORS;  Service: General;  Laterality: N/A;  . OVARY SURGERY      Family History  Problem Relation Age of Onset  . Diabetes Maternal Grandfather   . Colon cancer Neg Hx   . Esophageal cancer Neg Hx   . Liver cancer Neg Hx   . Pancreatic cancer Neg Hx   . Rectal cancer Neg Hx   . Stomach cancer Neg Hx     Social History Reviewed with no changes to be made today.   Outpatient Medications Prior to Visit  Medication Sig Dispense Refill  . cetirizine (ZYRTEC) 10 MG chewable tablet Chew 10 mg by mouth daily.    Marland Kitchen escitalopram (LEXAPRO) 20 MG tablet Take 1 tablet (20 mg total) by mouth at bedtime. 90 tablet 1  . levothyroxine (SYNTHROID, LEVOTHROID) 75 MCG tablet Take 75 mcg by mouth daily before breakfast.    . ondansetron (ZOFRAN) 4 MG tablet Take 1 tablet (4 mg total) every 8 (eight) hours as needed by mouth for nausea or vomiting. 20 tablet 0  . pantoprazole (PROTONIX) 40 MG tablet Take 1 tablet (40 mg total) by mouth daily. 90 tablet 1  . Probiotic CAPS Take 1 capsule by mouth daily.    Marland Kitchen acetaminophen (TYLENOL) 500 MG tablet  Take 500 mg by mouth every 6 (six) hours as needed.    . Ascorbic Acid (VITAMIN C PO) Take 1 tablet by mouth daily.    . Multiple Vitamin (MULTIVITAMIN WITH MINERALS) TABS tablet Take 1 tablet by mouth daily.     No facility-administered medications prior to visit.     Allergies  Allergen Reactions  . Penicillins Anaphylaxis and Other (See Comments)    Has patient had a PCN reaction causing immediate rash, facial/tongue/throat swelling, SOB or lightheadedness with hypotension: yes Has patient had a PCN reaction causing severe rash involving mucus membranes or skin necrosis: no Has patient had a PCN reaction that required hospitalization: yes Has patient had a PCN reaction occurring within the last 10 years: no If all of the above answers are "NO", then may proceed with Cephalosporin use.        Objective:    BP 114/71 (BP Location: Right Arm, Patient Position: Sitting, Cuff Size: Normal)   Pulse 68   Temp 98.2 F (36.8 C) (Oral)   Resp 12   Ht 5\' 3"  (1.6 m)  Wt 201 lb (91.2 kg)   SpO2 98%   BMI 35.61 kg/m  Wt Readings from Last 3 Encounters:  03/02/17 196 lb 9.6 oz (89.2 kg)  03/01/17 201 lb (91.2 kg)  02/28/17 197 lb (89.4 kg)    Physical Exam  Constitutional: She is oriented to person, place, and time. She appears well-developed and well-nourished. She does not appear ill. No distress.  HENT:  Head: Normocephalic.  Right Ear: Hearing, tympanic membrane, external ear and ear canal normal. No middle ear effusion.  Left Ear: Hearing, tympanic membrane, external ear and ear canal normal.  No middle ear effusion.  Nose: Mucosal edema and rhinorrhea present. Right sinus exhibits no maxillary sinus tenderness and no frontal sinus tenderness. Left sinus exhibits no maxillary sinus tenderness and no frontal sinus tenderness.  Mouth/Throat: Mucous membranes are normal. No oropharyngeal exudate, posterior oropharyngeal edema, posterior oropharyngeal erythema or tonsillar  abscesses.  Eyes: EOM are normal.  Neck: Normal range of motion. No thyromegaly present.  Cardiovascular: Normal rate and regular rhythm. Exam reveals no gallop and no friction rub.  No murmur heard. Pulmonary/Chest: Effort normal and breath sounds normal. No respiratory distress. She has no wheezes. She has no rales. She exhibits no tenderness.  Abdominal: Soft. Bowel sounds are normal.  Musculoskeletal: Normal range of motion.  Lymphadenopathy:    She has no cervical adenopathy.  Neurological: She is alert and oriented to person, place, and time.  Skin: Skin is warm, dry and intact.     1cm macular smooth round lesion. Brown in color. Nontender to palpation. No obvious signs of infection.   Psychiatric: She has a normal mood and affect. Her speech is normal and behavior is normal. Judgment and thought content normal. Cognition and memory are normal.      Patient has been counseled extensively about nutrition and exercise as well as the importance of adherence with medications and regular follow-up. The patient was given clear instructions to go to ER or return to medical center if symptoms don't improve, worsen or new problems develop. The patient verbalized understanding.   Follow-up: Return if symptoms worsen or fail to improve.   Gildardo Pounds, FNP-BC [facility], [place]   03/05/2017, 8:54 PM

## 2017-03-02 ENCOUNTER — Encounter (HOSPITAL_COMMUNITY): Payer: Self-pay

## 2017-03-02 ENCOUNTER — Ambulatory Visit
Admission: RE | Admit: 2017-03-02 | Discharge: 2017-03-02 | Disposition: A | Discharge status: Home / Self Care | Payer: No Typology Code available for payment source | Source: Ambulatory Visit | Attending: Obstetrics and Gynecology | Admitting: Obstetrics and Gynecology

## 2017-03-02 ENCOUNTER — Ambulatory Visit (HOSPITAL_COMMUNITY)
Admission: RE | Admit: 2017-03-02 | Discharge: 2017-03-02 | Disposition: A | Discharge status: Home / Self Care | Payer: Self-pay | Source: Ambulatory Visit | Attending: Obstetrics and Gynecology | Admitting: Obstetrics and Gynecology

## 2017-03-02 VITALS — BP 122/80 | Ht 66.0 in | Wt 196.6 lb

## 2017-03-02 DIAGNOSIS — Z1231 Encounter for screening mammogram for malignant neoplasm of breast: Secondary | ICD-10-CM

## 2017-03-02 DIAGNOSIS — Z01419 Encounter for gynecological examination (general) (routine) without abnormal findings: Secondary | ICD-10-CM

## 2017-03-02 NOTE — Patient Instructions (Signed)
Explained breast self awareness with Niylah Warrick. Let patient know [facility] will cover Pap smears and HPV typing every 5 years unless has a history of abnormal Pap smears. Referred patient to the [place] for a screening mammogram. Appointment scheduled for Thursday, March 02, 2017 at 1410. Let patient know will follow up with her within the next couple weeks with results of Pap smear by letter or phone. Andrea Blackwell verbalized understanding.  Joeangel Jeanpaul, Arvil Chaco, RN 3:27 PM

## 2017-03-02 NOTE — Progress Notes (Signed)
No complaints today.   Pap Smear: Pap smear completed today. Last Pap smear was around 5 years ago and normal per patient. Per patient has a history of an abnormal Pap smear when she was in her early 20's that a follow-up Pap smear was completed. Per patient all Pap smears have been normal since follow-up. No Pap smear results are in Epic.  Physical exam: Breasts Breasts symmetrical. No skin abnormalities bilateral breasts. No nipple retraction bilateral breasts. No nipple discharge bilateral breasts. No lymphadenopathy. No lumps palpated bilateral breasts. No complaints of pain or tenderness on exam. Referred patient to the [facility] for a screening mammogram. Appointment scheduled for Thursday, March 02, 2017 at 1410.  Pelvic/Bimanual   Ext Genitalia No lesions, no swelling and no discharge observed on external genitalia.         Vagina Vagina pink and normal texture. No lesions or discharge observed in vagina.          Cervix Cervix is present. Cervix pink and of normal texture. Cervix friable. No discharge observed.     Uterus Uterus is present and palpable. Uterus in normal position and normal size.        Adnexae Bilateral ovaries present and palpable. No tenderness on palpation.          Rectovaginal No rectal exam completed today since patient had no rectal complaints. No skin abnormalities observed on exam.    Smoking History: Patient is a former smoker that per patient quit in 1995.  Patient Navigation: Patient education provided. Access to services provided for patient through [facility] program.   Colorectal Cancer Screening: Patient is scheduled to have a colonoscopy completed on 03/14/2017. No complaints today.

## 2017-03-03 ENCOUNTER — Encounter: Payer: Self-pay | Admitting: Gastroenterology

## 2017-03-03 ENCOUNTER — Encounter (HOSPITAL_COMMUNITY): Payer: Self-pay | Admitting: *Deleted

## 2017-03-05 ENCOUNTER — Encounter: Payer: Self-pay | Admitting: Nurse Practitioner

## 2017-03-06 LAB — CYTOLOGY - PAP
DIAGNOSIS: NEGATIVE
HPV: NOT DETECTED

## 2017-03-13 ENCOUNTER — Telehealth: Payer: Self-pay | Admitting: Gastroenterology

## 2017-03-13 NOTE — Telephone Encounter (Signed)
The pt has been advised that she can continue with procedure as scheduled even with rectal bleeding.

## 2017-03-15 ENCOUNTER — Encounter: Payer: Self-pay | Admitting: Gastroenterology

## 2017-03-15 ENCOUNTER — Other Ambulatory Visit: Payer: Self-pay

## 2017-03-15 ENCOUNTER — Ambulatory Visit (AMBULATORY_SURGERY_CENTER): Payer: Self-pay | Admitting: Gastroenterology

## 2017-03-15 VITALS — BP 120/57 | HR 52 | Temp 96.8°F | Resp 13 | Ht 63.0 in | Wt 201.0 lb

## 2017-03-15 DIAGNOSIS — Z1212 Encounter for screening for malignant neoplasm of rectum: Secondary | ICD-10-CM

## 2017-03-15 DIAGNOSIS — D120 Benign neoplasm of cecum: Secondary | ICD-10-CM

## 2017-03-15 DIAGNOSIS — K388 Other specified diseases of appendix: Secondary | ICD-10-CM

## 2017-03-15 DIAGNOSIS — K649 Unspecified hemorrhoids: Secondary | ICD-10-CM

## 2017-03-15 DIAGNOSIS — D121 Benign neoplasm of appendix: Secondary | ICD-10-CM

## 2017-03-15 DIAGNOSIS — Z1211 Encounter for screening for malignant neoplasm of colon: Secondary | ICD-10-CM

## 2017-03-15 MED ORDER — SODIUM CHLORIDE 0.9 % IV SOLN: 500.0000 mL | Freq: Once | INTRAVENOUS | Status: DC

## 2017-03-15 NOTE — Op Note (Signed)
[facility] Patient Name: Andrea Blackwell Procedure Date: 03/15/2017 9:01 AM MRN: [id number] Endoscopist: Milus Banister , MD Age: 62 Referring MD:  Date of Birth: Jul 07, 1955 Gender: Female Account #: [id number] Procedure:                Colonoscopy Indications:              Screening for colorectal malignant neoplasm Medicines:                Monitored Anesthesia Care Procedure:                Pre-Anesthesia Assessment:                           - Prior to the procedure, a History and Physical                            was performed, and patient medications and                            allergies were reviewed. The patient's tolerance of                            previous anesthesia was also reviewed. The risks                            and benefits of the procedure and the sedation                            options and risks were discussed with the patient.                            All questions were answered, and informed consent                            was obtained. Prior Anticoagulants: The patient has                            taken no previous anticoagulant or antiplatelet                            agents. ASA Grade Assessment: II - A patient with                            mild systemic disease. After reviewing the risks                            and benefits, the patient was deemed in                            satisfactory condition to undergo the procedure.                           After obtaining informed consent, the colonoscope  was passed under direct vision. Throughout the                            procedure, the patient's blood pressure, pulse, and                            oxygen saturations were monitored continuously. The                            Colonoscope was introduced through the anus and                            advanced to the the cecum, identified by                            appendiceal orifice and  ileocecal valve. The                            colonoscopy was performed without difficulty. The                            patient tolerated the procedure well. The quality                            of the bowel preparation was good. The ileocecal                            valve, appendiceal orifice, and rectum were                            photographed. Scope In: 9:02:52 AM Scope Out: 9:17:15 AM Scope Withdrawal Time: 0 hours 11 minutes 22 seconds  Total Procedure Duration: 0 hours 14 minutes 23 seconds  Findings:                 A 10 mm polyp was found in the appendiceal orifice.                            The polyp was sessile. The polyp was removed with a                            cold snare. Resection and retrieval were complete.                           External and internal hemorrhoids were found. The                            hemorrhoids were small.                           The exam was otherwise without abnormality on                            direct and retroflexion views. Complications:  No immediate complications. Estimated blood loss:                            None. Estimated Blood Loss:     Estimated blood loss: none. Impression:               - One 10 mm polyp at the appendiceal orifice,                            removed with a cold snare. Resected and retrieved.                           - External and internal hemorrhoids.                           - The examination was otherwise normal on direct                            and retroflexion views. Recommendation:           - Patient has a contact number available for                            emergencies. The signs and symptoms of potential                            delayed complications were discussed with the                            patient. Return to normal activities tomorrow.                            Written discharge instructions were provided to the                             patient.                           - Resume previous diet.                           - Continue present medications.                           You will receive a letter within 2-3 weeks with the                            pathology results and my final recommendations.                           If the polyp(s) is proven to be 'pre-cancerous' on                            pathology, you will need repeat colonoscopy in 3  years. If the polyp(s) is NOT 'precancerous' on                            pathology then you should repeat colon cancer                            screening in 10 years with colonoscopy without need                            for colon cancer screening by any method prior to                            then (including stool testing). Milus Banister, MD 03/15/2017 9:21:14 AM This report has been signed electronically.

## 2017-03-15 NOTE — Patient Instructions (Signed)
YOU HAD AN ENDOSCOPIC PROCEDURE TODAY AT THE [facility]:   Refer to the procedure report that was given to you for any specific questions about what was found during the examination.  If the procedure report does not answer your questions, please call your gastroenterologist to clarify.  If you requested that your care partner not be given the details of your procedure findings, then the procedure report has been included in a sealed envelope for you to review at your convenience later.  YOU SHOULD EXPECT: Some feelings of bloating in the abdomen. Passage of more gas than usual.  Walking can help get rid of the air that was put into your GI tract during the procedure and reduce the bloating. If you had a lower endoscopy (such as a colonoscopy or flexible sigmoidoscopy) you may notice spotting of blood in your stool or on the toilet paper. If you underwent a bowel prep for your procedure, you may not have a normal bowel movement for a few days.  Please Note:  You might notice some irritation and congestion in your nose or some drainage.  This is from the oxygen used during your procedure.  There is no need for concern and it should clear up in a day or so.  SYMPTOMS TO REPORT IMMEDIATELY:   Following lower endoscopy (colonoscopy or flexible sigmoidoscopy):  Excessive amounts of blood in the stool  Significant tenderness or worsening of abdominal pains  Swelling of the abdomen that is new, acute  Fever of 100F or higher    For urgent or emergent issues, a gastroenterologist can be reached at any hour by calling [phone].   DIET:  We do recommend a small meal at first, but then you may proceed to your regular diet.  Drink plenty of fluids but you should avoid alcoholic beverages for 24 hours.  ACTIVITY:  You should plan to take it easy for the rest of today and you should NOT DRIVE or use heavy machinery until tomorrow (because of the sedation medicines used during the test).     FOLLOW UP: Our staff will call the number listed on your records the next business day following your procedure to check on you and address any questions or concerns that you may have regarding the information given to you following your procedure. If we do not reach you, we will leave a message.  However, if you are feeling well and you are not experiencing any problems, there is no need to return our call.  We will assume that you have returned to your regular daily activities without incident.  If any biopsies were taken you will be contacted by phone or by letter within the next 1-3 weeks.  Please call us at [phone] if you have not heard about the biopsies in 3 weeks.    SIGNATURES/CONFIDENTIALITY: You and/or your care partner have signed paperwork which will be entered into your electronic medical record.  These signatures attest to the fact that that the information above on your After Visit Summary has been reviewed and is understood.  Full responsibility of the confidentiality of this discharge information lies with you and/or your care-partner.   Resume medications. Information given on polyps and hemorrhoids. 

## 2017-03-15 NOTE — Progress Notes (Signed)
Called to room to assist during endoscopic procedure.  Patient ID and intended procedure confirmed with present staff. Received instructions for my participation in the procedure from the performing physician.  

## 2017-03-15 NOTE — Progress Notes (Signed)
Pt's states no medical or surgical changes since previsit or office visit. 

## 2017-03-15 NOTE — Progress Notes (Signed)
Report given to PACU, vss 

## 2017-03-16 ENCOUNTER — Telehealth: Payer: Self-pay | Admitting: *Deleted

## 2017-03-16 MED FILL — ?DOXYCYCLINE 100MG TABLET: 100 | 10 days supply | Qty: 20 | Fill #0

## 2017-03-16 NOTE — Telephone Encounter (Signed)
  Follow up Call-  Call back number 03/15/2017  Post procedure Call Back phone  # [phone]  Permission to leave phone message Yes     Patient questions:  Do you have a fever, pain , or abdominal swelling? No. Pain Score  0 *  Have you tolerated food without any problems? Yes.    Have you been able to return to your normal activities? Yes.    Do you have any questions about your discharge instructions: Diet   No. Medications  No. Follow up visit  No.  Do you have questions or concerns about your Care? No.  Actions: * If pain score is 4 or above: No action needed, pain <4.

## 2017-03-21 ENCOUNTER — Encounter: Payer: Self-pay | Admitting: Gastroenterology

## 2017-03-21 ENCOUNTER — Encounter (HOSPITAL_COMMUNITY): Payer: Self-pay | Admitting: *Deleted

## 2017-03-21 NOTE — Progress Notes (Signed)
Letter mailed to patient with negative pap smear results. Next pap smear due in five years.  

## 2017-03-23 MED FILL — ?DOXYCYCLINE 100MG TABLET: 100 | 10 days supply | Qty: 20 | Fill #1

## 2017-03-24 ENCOUNTER — Encounter: Payer: Self-pay | Admitting: Nurse Practitioner

## 2017-03-24 ENCOUNTER — Ambulatory Visit: Payer: Self-pay | Attending: Nurse Practitioner | Admitting: Nurse Practitioner

## 2017-03-24 VITALS — BP 118/77 | HR 63 | Temp 98.6°F | Ht 63.0 in | Wt 195.0 lb

## 2017-03-24 DIAGNOSIS — E039 Hypothyroidism, unspecified: Secondary | ICD-10-CM | POA: Insufficient documentation

## 2017-03-24 DIAGNOSIS — Z880 Allergy status to penicillin: Secondary | ICD-10-CM | POA: Insufficient documentation

## 2017-03-24 DIAGNOSIS — Z9049 Acquired absence of other specified parts of digestive tract: Secondary | ICD-10-CM | POA: Insufficient documentation

## 2017-03-24 DIAGNOSIS — X58XXXA Exposure to other specified factors, initial encounter: Secondary | ICD-10-CM | POA: Insufficient documentation

## 2017-03-24 DIAGNOSIS — Z79899 Other long term (current) drug therapy: Secondary | ICD-10-CM | POA: Insufficient documentation

## 2017-03-24 DIAGNOSIS — Z79890 Hormone replacement therapy (postmenopausal): Secondary | ICD-10-CM | POA: Insufficient documentation

## 2017-03-24 DIAGNOSIS — F329 Major depressive disorder, single episode, unspecified: Secondary | ICD-10-CM | POA: Insufficient documentation

## 2017-03-24 DIAGNOSIS — S8011XA Contusion of right lower leg, initial encounter: Secondary | ICD-10-CM | POA: Insufficient documentation

## 2017-03-24 DIAGNOSIS — F419 Anxiety disorder, unspecified: Secondary | ICD-10-CM | POA: Insufficient documentation

## 2017-03-24 DIAGNOSIS — K219 Gastro-esophageal reflux disease without esophagitis: Secondary | ICD-10-CM | POA: Insufficient documentation

## 2017-03-24 DIAGNOSIS — T148XXA Other injury of unspecified body region, initial encounter: Secondary | ICD-10-CM

## 2017-03-24 NOTE — Patient Instructions (Addendum)
Hematoma A hematoma is a collection of blood. The collection of blood can turn into a hard, painful lump under the skin. Your skin may turn blue or yellow if the hematoma is close to the surface of the skin. Most hematomas get better in a few days to weeks. Some hematomas are serious and need medical care. Hematomas can be very small or very big. Follow these instructions at home:  Apply ice to the injured area: ? Put ice in a plastic bag. ? Place a towel between your skin and the bag. ? Leave the ice on for 20 minutes, 2-3 times a day for the first 1 to 2 days.  After the first 2 days, switch to using warm packs on the injured area.  Raise (elevate) the injured area to lessen pain and puffiness (swelling). You may also wrap the area with an elastic bandage. Make sure the bandage is not wrapped too tight.  If you have a painful hematoma on your leg or foot, you may use crutches for a couple days.  Only take medicines as told by your doctor. Get help right away if:  Your pain gets worse.  Your pain is not controlled with medicine.  You have a fever.  Your puffiness gets worse.  Your skin turns more blue or yellow.  Your skin over the hematoma breaks or starts bleeding.  Your hematoma is in your chest or belly (abdomen) and you are short of breath, feel weak, or have a change in consciousness.  Your hematoma is on your scalp and you have a headache that gets worse or a change in alertness or consciousness. This information is not intended to replace advice given to you by your health care provider. Make sure you discuss any questions you have with your health care provider. Document Released: 03/03/2004 Document Revised: 07/02/2015 Document Reviewed: 07/04/2012 Elsevier Interactive Patient Education  2017 [place] [employer].  Deep Vein Thrombosis Deep vein thrombosis (DVT) is a condition in which a blood clot forms in a deep vein, such as a lower leg, thigh, or arm vein. A clot is  blood that has thickened into a gel or solid. This condition is dangerous. It can lead to serious and even life-threatening complications if the clot travels to the lungs and causes a blockage (pulmonary embolism). It can also damage veins in the leg. This can result in leg pain, swelling, discoloration, and sores (post-thrombotic syndrome). What are the causes? This condition may be caused by:  A slowdown of blood flow.  Damage to a vein.  A condition that makes blood clot more easily.  What increases the risk? The following factors may make you more likely to develop this condition:  Being overweight.  Being elderly, especially over age 35.  Sitting or lying down for more than four hours.  Lack of physical activity (sedentary lifestyle).  Being pregnant, giving birth, or having recently given birth.  Taking medicines that contain estrogen.  Smoking.  A history of any of the following: ? Blood clots or blood clotting disease. ? Peripheral vascular disease. ? Inflammatory bowel disease. ? Cancer. ? Heart disease. ? Genetic conditions that affect how blood clots. ? Neurological diseases that affect the legs (leg paresis). ? Injury. ? Major or lengthy surgery. ? A central line placed inside a large vein.  What are the signs or symptoms? Symptoms of this condition include:  Swelling, pain, or tenderness in an arm or leg.  Warmth, redness, or discoloration in an arm or  leg.  If the clot is in your leg, symptoms may be more noticeable or worse when you stand or walk. Some people do not have any symptoms. How is this diagnosed? This condition is diagnosed with:  A medical history.  A physical exam.  Tests, such as: ? Blood tests. These are done to see how your blood clots. ? Imaging tests. These are done to check for clots. Tests may include:  Ultrasound.  CT scan.  MRI.  X-ray.  Venogram. For this test, X-rays are taken after a dye is injected into a  vein.  How is this treated? Treatment for this condition depends on the cause, your risk for bleeding or developing more clots, and any medical conditions you have. Treatment may include:  Taking blood thinners (also called anticoagulants). These medicines may be taken by mouth, injected under the skin, or injected through an IV tube (catheter). These medicines prevent clots from forming.  Injecting medicine that dissolves blood clots into the affected vein (catheter-directed thrombolysis).  Having surgery. Surgery may be done to: ? Remove the clot. ? Place a filter in a large vein to catch blood clots before they reach the lungs.  Some treatments may be continued for up to six months. Follow these instructions at home: If you are taking an oral blood thinner:  Take the medicine exactly as told by your health care provider. Some blood thinners need to be taken at the same time every day. Do not skip a dose.  Ask your health care provider about what foods and drugs interact with the medicine.  Ask about possible side effects. General instructions  Blood thinners can cause easy bruising and difficulty stopping bleeding. Because of this, if you are taking or were given a blood thinner: ? Hold pressure over cuts for longer than usual. ? Tell your dentist and other health care providers that you are taking blood thinners before having any procedures that can cause bleeding. ? Avoid contact sports.  Take over-the-counter and prescription medicines only as told by your health care provider.  Return to your normal activities as told by your health care provider. Ask your health care provider what activities are safe for you.  Wear compression stockings if recommended by your health care provider.  Keep all follow-up visits as told by your health care provider. This is important. How is this prevented? To lower your risk of developing this condition again:  For 30 or more minutes every  day, do an activity that: ? Involves moving your arms and legs. ? Increases your heart rate.  When traveling for longer than four hours: ? Exercise your arms and legs every hour. ? Drink plenty of water. ? Avoid drinking alcohol.  Avoid sitting or lying for a long time without moving your legs.  Stay a healthy weight.  If you are a woman who is older than age 73, avoid unnecessary use of medicines that contain estrogen.  Do not use any products that contain nicotine or tobacco, such as cigarettes and e-cigarettes. This is especially important if you take estrogen medicines. If you need help quitting, ask your health care provider.  Contact a health care provider if:  You miss a dose of your blood thinner.  You have nausea, vomiting, or diarrhea that lasts for more than one day.  Your menstrual period is heavier than usual.  You have unusual bruising. Get help right away if:  You have new or increased pain, swelling, or redness in an  arm or leg.  You have numbness or tingling in an arm or leg.  You have shortness of breath.  You have chest pain.  You have a rapid or irregular heartbeat.  You feel light-headed or dizzy.  You cough up blood.  There is blood in your vomit, stool, or urine.  You have a serious fall or accident, or you hit your head.  You have a severe headache or confusion.  You have a cut that will not stop bleeding. These symptoms may represent a serious problem that is an emergency. Do not wait to see if the symptoms will go away. Get medical help right away. Call your local emergency services (911 in the [place] Do not drive yourself to the hospital. Summary  DVT is a condition in which a blood clot forms in a deep vein, such as a lower leg, thigh, or arm vein.  Symptoms can include swelling, warmth, pain, and redness in your leg or arm.  Treatment may include taking blood thinners, injecting medicine that dissolves blood clots,wearing  compression stockings, or surgery.  If you are prescribed blood thinners, take them exactly as told. This information is not intended to replace advice given to you by your health care provider. Make sure you discuss any questions you have with your health care provider. Document Released: 01/24/2005 Document Revised: 02/27/2016 Document Reviewed: 02/27/2016 Elsevier Interactive Patient Education  2018 [employer].

## 2017-03-24 NOTE — Progress Notes (Signed)
Assessment & Plan:  [name] was seen today for leg injury.  Diagnoses and all orders for this visit:  Bruising Continue to monitor for improvement Apply ice to the area for pain relief Avoid overuse of NSAIDs  Patient has been counseled on age-appropriate routine health concerns for screening and prevention. These are reviewed and up-to-date. Referrals have been placed accordingly. Immunizations are up-to-date or declined.    Subjective:   Chief Complaint  Patient presents with  . Leg Injury    Patient stated she have a big bruise on her right leg and started 4 days ago. Patient stated no major pain but little tender when touch. Patient is concern with the bruises on her leg and do not know why it appear.    HPI Andrea Blackwell 62 y.o. female presents to office today with concern of a bruise on the back of her calf. She recently had a colonoscopy performed on 03-15-2017. She noticed the bruising after her procedure but is unsure how many days after the procedure that she noticed it. She states initially the area was sore with a small palpable "lump" a few days after the procedure and then the bruise appeared. She is concerned it may be a DVT as her ex husband was diagnosed with multiple DVTs after an orthopedic surgery.   Review of Systems  Constitutional: Negative.  Negative for chills, fever, malaise/fatigue and weight loss.  Respiratory: Negative for cough, sputum production, shortness of breath and wheezing.   Cardiovascular: Negative.  Negative for chest pain, orthopnea and leg swelling.  Musculoskeletal: Negative for falls and joint pain.  Skin:       SEE HPI  Neurological: Negative for dizziness and headaches.  Endo/Heme/Allergies: Does not bruise/bleed easily.    Past Medical History:  Diagnosis Date  . Allergy   . Anxiety   . Cataract   . Depression   . GERD (gastroesophageal reflux disease)   . Hypothyroidism   . Pancreatitis     Past Surgical History:  Procedure  Laterality Date  . CHOLECYSTECTOMY    . ENDOSCOPIC RETROGRADE CHOLANGIOPANCREATOGRAPHY (ERCP) WITH PROPOFOL N/A 02/02/2017   Procedure: ENDOSCOPIC RETROGRADE CHOLANGIOPANCREATOGRAPHY (ERCP) WITH PROPOFOL;  Surgeon: Milus Banister, MD;  Location: [facility];  Service: Endoscopy;  Laterality: N/A;  . ERCP N/A 12/10/2016   Procedure: ENDOSCOPIC RETROGRADE CHOLANGIOPANCREATOGRAPHY (ERCP);  Surgeon: Carol Ada, MD;  Location: [facility];  Service: Endoscopy;  Laterality: N/A;  . ERCP N/A 12/14/2016   Procedure: ENDOSCOPIC RETROGRADE CHOLANGIOPANCREATOGRAPHY (ERCP);  Surgeon: Milus Banister, MD;  Location: [facility];  Service: Endoscopy;  Laterality: N/A;  . LAPAROSCOPIC CHOLECYSTECTOMY SINGLE SITE WITH INTRAOPERATIVE CHOLANGIOGRAM N/A 12/13/2016   Procedure: LAPAROSCOPIC CHOLECYSTECTOMY SINGLE SITE AND LIVER BIOPSY;  Surgeon: Michael Boston, MD;  Location: [facility];  Service: General;  Laterality: N/A;  . OVARY SURGERY      Family History  Problem Relation Age of Onset  . Diabetes Maternal Grandfather   . Colon cancer Neg Hx   . Esophageal cancer Neg Hx   . Liver cancer Neg Hx   . Pancreatic cancer Neg Hx   . Rectal cancer Neg Hx   . Stomach cancer Neg Hx   . Breast cancer Neg Hx   . Prostate cancer Neg Hx     Social History Reviewed with no changes to be made today.   Outpatient Medications Prior to Visit  Medication Sig Dispense Refill  . escitalopram (LEXAPRO) 20 MG tablet Take 1 tablet (20 mg total) by mouth at  bedtime. 90 tablet 1  . levothyroxine (SYNTHROID, LEVOTHROID) 75 MCG tablet Take 75 mcg by mouth daily before breakfast.    . acetaminophen (TYLENOL) 500 MG tablet Take 500 mg by mouth every 6 (six) hours as needed.    . Ascorbic Acid (VITAMIN C PO) Take 1 tablet by mouth daily.    . cetirizine (ZYRTEC) 10 MG chewable tablet Chew 10 mg by mouth daily.    . fluticasone (FLONASE) 50 MCG/ACT nasal spray Place 2 sprays into both nostrils daily. (Patient not taking:  Reported on 03/24/2017) 16 g 6  . Multiple Vitamin (MULTIVITAMIN WITH MINERALS) TABS tablet Take 1 tablet by mouth daily.    . ondansetron (ZOFRAN) 4 MG tablet Take 1 tablet (4 mg total) every 8 (eight) hours as needed by mouth for nausea or vomiting. (Patient not taking: Reported on 03/24/2017) 20 tablet 0  . pantoprazole (PROTONIX) 40 MG tablet Take 1 tablet (40 mg total) by mouth daily. (Patient not taking: Reported on 03/24/2017) 90 tablet 1  . Probiotic CAPS Take 1 capsule by mouth daily.    Marland Kitchen triamcinolone ointment (KENALOG) 0.5 % Apply 1 application topically 2 (two) times daily. (Patient not taking: Reported on 03/15/2017) 30 g 0   Facility-Administered Medications Prior to Visit  Medication Dose Route Frequency Provider Last Rate Last Dose  . 0.9 %  sodium chloride infusion  500 mL Intravenous Once Milus Banister, MD        Allergies  Allergen Reactions  . Penicillins Anaphylaxis and Other (See Comments)    Has patient had a PCN reaction causing immediate rash, facial/tongue/throat swelling, SOB or lightheadedness with hypotension: yes Has patient had a PCN reaction causing severe rash involving mucus membranes or skin necrosis: no Has patient had a PCN reaction that required hospitalization: yes Has patient had a PCN reaction occurring within the last 10 years: no If all of the above answers are "NO", then may proceed with Cephalosporin use.        Objective:    BP 118/77 (BP Location: Right Arm, Patient Position: Sitting, Cuff Size: Normal)   Pulse 63   Temp 98.6 F (37 C) (Oral)   Ht 5\' 3"  (1.6 m)   Wt 195 lb (88.5 kg)   SpO2 96%   BMI 34.54 kg/m  Wt Readings from Last 3 Encounters:  03/24/17 195 lb (88.5 kg)  03/15/17 201 lb (91.2 kg)  03/02/17 196 lb 9.6 oz (89.2 kg)    Physical Exam  Constitutional: She is oriented to person, place, and time. She appears well-developed and well-nourished. She is cooperative.  HENT:  Head: Normocephalic and atraumatic.    Cardiovascular: Normal rate, regular rhythm, normal heart sounds and intact distal pulses. Exam reveals no gallop and no friction rub.  No murmur heard. Pulmonary/Chest: Effort normal and breath sounds normal. No tachypnea. No respiratory distress. She has no decreased breath sounds. She has no wheezes. She has no rhonchi. She has no rales. She exhibits no tenderness.  Musculoskeletal: Normal range of motion. She exhibits tenderness. She exhibits no edema or deformity.       Right lower leg: She exhibits tenderness. She exhibits no bony tenderness, no swelling, no edema, no deformity and no laceration.       Legs: There is bruising to the posterior calf. Skin is greenish yellow with dark purple/bluish discoloration at the center of the bruise. There is mild pain with firm palpation and 1cm firm nodule at the center of the affected area. There  is no erythema, area is not warm to touch and there is no obvious swelling of the RLE to note.   Neurological: She is alert and oriented to person, place, and time. Coordination normal.  Skin: Skin is warm and dry. No erythema. No pallor.  Psychiatric: She has a normal mood and affect. Her behavior is normal. Judgment and thought content normal.  Nursing note and vitals reviewed.      Patient has been counseled extensively about nutrition and exercise as well as the importance of adherence with medications and regular follow-up. The patient was given clear instructions to go to ER or return to medical center if symptoms don't improve, worsen or new problems develop. The patient verbalized understanding.   Follow-up: No Follow-up on file.   Gildardo Pounds, FNP-BC [facility] [place]   03/24/2017, 1:37 PM

## 2017-03-29 MED FILL — ESCITALOPRAM 20 MG TABLET: 20 | 30 days supply | Qty: 30 | Fill #0

## 2017-04-10 ENCOUNTER — Ambulatory Visit: Payer: No Typology Code available for payment source | Admitting: Nurse Practitioner

## 2017-04-10 ENCOUNTER — Telehealth: Payer: Self-pay

## 2017-04-10 ENCOUNTER — Encounter: Payer: Self-pay | Admitting: Nurse Practitioner

## 2017-04-10 ENCOUNTER — Ambulatory Visit: Payer: Self-pay | Attending: Nurse Practitioner | Admitting: Nurse Practitioner

## 2017-04-10 VITALS — BP 100/67 | HR 67 | Temp 99.2°F | Ht 63.0 in | Wt 195.4 lb

## 2017-04-10 DIAGNOSIS — R6889 Other general symptoms and signs: Secondary | ICD-10-CM | POA: Insufficient documentation

## 2017-04-10 DIAGNOSIS — J111 Influenza due to unidentified influenza virus with other respiratory manifestations: Secondary | ICD-10-CM | POA: Insufficient documentation

## 2017-04-10 DIAGNOSIS — K219 Gastro-esophageal reflux disease without esophagitis: Secondary | ICD-10-CM | POA: Insufficient documentation

## 2017-04-10 DIAGNOSIS — Z79899 Other long term (current) drug therapy: Secondary | ICD-10-CM | POA: Insufficient documentation

## 2017-04-10 DIAGNOSIS — Z880 Allergy status to penicillin: Secondary | ICD-10-CM | POA: Insufficient documentation

## 2017-04-10 DIAGNOSIS — E039 Hypothyroidism, unspecified: Secondary | ICD-10-CM | POA: Insufficient documentation

## 2017-04-10 DIAGNOSIS — F329 Major depressive disorder, single episode, unspecified: Secondary | ICD-10-CM | POA: Insufficient documentation

## 2017-04-10 DIAGNOSIS — F419 Anxiety disorder, unspecified: Secondary | ICD-10-CM | POA: Insufficient documentation

## 2017-04-10 MED ORDER — BUSPIRONE HCL 5 MG PO TABS: 15.0000 mg | ORAL_TABLET | Freq: Three times a day (TID) | ORAL | 1 refills | Status: DC

## 2017-04-10 MED ORDER — BUSPIRONE HCL 15 MG PO TABS: 15.0000 mg | ORAL_TABLET | Freq: Three times a day (TID) | ORAL | 1 refills | Status: DC

## 2017-04-10 NOTE — Progress Notes (Signed)
Assessment & Plan:  Andrea Blackwell was seen today for anxiety.  Diagnoses and all orders for this visit:  Anxiety -     busPIRone (BUSPAR) 5 MG tablet; Take 3 tablets (15 mg total) by mouth 3 (three) times daily. Continue lexapro as prescribed. She declines referral for therapist   Flu-like symptoms -     Influenza A and B, RT PCR  Patient has been counseled on age-appropriate routine health concerns for screening and prevention. These are reviewed and up-to-date. Referrals have been placed accordingly. Immunizations are up-to-date or declined.    Subjective:   Chief Complaint  Patient presents with  . Anxiety    Patient is here for anxiety.    HPI Andrea Blackwell 62 y.o. female presents to office today with flu like symptoms and complaints of increased stress and anxiety.  Flu Like Symptoms Onset 2 days ago. She received the flu vaccine 4 months ago.  Symptoms include fatigue, malaise hypersomnia, sore throat, headache, bilateral ear pain, fever, cough, runny nose. She has been taking OTC cold and flu medicine with no relief of symptoms. She has not been in contact with any other sick individuals.    Anxiety She has been taking her friend's xanax and today is requesting "something for anxiety". Currently she is going through a very bitter divorce and endorses increased stress and anxiety. She is still taking her lexapro which seems to help her mood but has not helped to relieve her anxiety.  Depression screen PHQ 2/9 04/10/2017  Decreased Interest 0  Down, Depressed, Hopeless 1  PHQ - 2 Score 1  Altered sleeping 3  Tired, decreased energy 3  Change in appetite 3  Feeling bad or failure about yourself  0  Trouble concentrating 1  Moving slowly or fidgety/restless 0  Suicidal thoughts 0  PHQ-9 Score 11    Review of Systems  Constitutional: Positive for fever and malaise/fatigue.  HENT: Positive for congestion, ear pain and sore throat.        Coryza  Eyes: Negative for blurred vision  and double vision.  Respiratory: Positive for cough. Negative for sputum production and shortness of breath.   Cardiovascular: Negative.  Negative for chest pain and palpitations.  Gastrointestinal: Negative for nausea and vomiting.  Neurological: Positive for headaches.  Psychiatric/Behavioral: Positive for depression. The patient is nervous/anxious.     Past Medical History:  Diagnosis Date  . Allergy   . Anxiety   . Cataract   . Depression   . GERD (gastroesophageal reflux disease)   . Hypothyroidism   . Pancreatitis     Past Surgical History:  Procedure Laterality Date  . CHOLECYSTECTOMY    . ENDOSCOPIC RETROGRADE CHOLANGIOPANCREATOGRAPHY (ERCP) WITH PROPOFOL N/A 02/02/2017   Procedure: ENDOSCOPIC RETROGRADE CHOLANGIOPANCREATOGRAPHY (ERCP) WITH PROPOFOL;  Surgeon: Milus Banister, MD;  Location: [facility];  Service: Endoscopy;  Laterality: N/A;  . ERCP N/A 12/10/2016   Procedure: ENDOSCOPIC RETROGRADE CHOLANGIOPANCREATOGRAPHY (ERCP);  Surgeon: Carol Ada, MD;  Location: [facility];  Service: Endoscopy;  Laterality: N/A;  . ERCP N/A 12/14/2016   Procedure: ENDOSCOPIC RETROGRADE CHOLANGIOPANCREATOGRAPHY (ERCP);  Surgeon: Milus Banister, MD;  Location: [facility];  Service: Endoscopy;  Laterality: N/A;  . LAPAROSCOPIC CHOLECYSTECTOMY SINGLE SITE WITH INTRAOPERATIVE CHOLANGIOGRAM N/A 12/13/2016   Procedure: LAPAROSCOPIC CHOLECYSTECTOMY SINGLE SITE AND LIVER BIOPSY;  Surgeon: Michael Boston, MD;  Location: [facility];  Service: General;  Laterality: N/A;  . OVARY SURGERY      Family History  Problem Relation Age of Onset  .  Diabetes Maternal Grandfather   . Colon cancer Neg Hx   . Esophageal cancer Neg Hx   . Liver cancer Neg Hx   . Pancreatic cancer Neg Hx   . Rectal cancer Neg Hx   . Stomach cancer Neg Hx   . Breast cancer Neg Hx   . Prostate cancer Neg Hx     Social History Reviewed with no changes to be made today.   Outpatient Medications Prior to Visit    Medication Sig Dispense Refill  . acetaminophen (TYLENOL) 500 MG tablet Take 500 mg by mouth every 6 (six) hours as needed.    . Ascorbic Acid (VITAMIN C PO) Take 1 tablet by mouth daily.    . cetirizine (ZYRTEC) 10 MG chewable tablet Chew 10 mg by mouth daily.    Marland Kitchen escitalopram (LEXAPRO) 20 MG tablet Take 1 tablet (20 mg total) by mouth at bedtime. 90 tablet 1  . levothyroxine (SYNTHROID, LEVOTHROID) 75 MCG tablet Take 75 mcg by mouth daily before breakfast.    . Multiple Vitamin (MULTIVITAMIN WITH MINERALS) TABS tablet Take 1 tablet by mouth daily.    . Probiotic CAPS Take 1 capsule by mouth daily.    . fluticasone (FLONASE) 50 MCG/ACT nasal spray Place 2 sprays into both nostrils daily. (Patient not taking: Reported on 03/24/2017) 16 g 6  . ondansetron (ZOFRAN) 4 MG tablet Take 1 tablet (4 mg total) every 8 (eight) hours as needed by mouth for nausea or vomiting. (Patient not taking: Reported on 03/24/2017) 20 tablet 0  . pantoprazole (PROTONIX) 40 MG tablet Take 1 tablet (40 mg total) by mouth daily. (Patient not taking: Reported on 03/24/2017) 90 tablet 1  . triamcinolone ointment (KENALOG) 0.5 % Apply 1 application topically 2 (two) times daily. (Patient not taking: Reported on 03/15/2017) 30 g 0   Facility-Administered Medications Prior to Visit  Medication Dose Route Frequency Provider Last Rate Last Dose  . 0.9 %  sodium chloride infusion  500 mL Intravenous Once Milus Banister, MD        Allergies  Allergen Reactions  . Penicillins Anaphylaxis and Other (See Comments)    Has patient had a PCN reaction causing immediate rash, facial/tongue/throat swelling, SOB or lightheadedness with hypotension: yes Has patient had a PCN reaction causing severe rash involving mucus membranes or skin necrosis: no Has patient had a PCN reaction that required hospitalization: yes Has patient had a PCN reaction occurring within the last 10 years: no If all of the above answers are "NO", then may proceed  with Cephalosporin use.        Objective:    BP 100/67 (BP Location: Right Arm, Patient Position: Sitting, Cuff Size: Normal)   Pulse 67   Temp 99.2 F (37.3 C) (Oral)   Ht 5\' 3"  (1.6 m)   Wt 195 lb 6.4 oz (88.6 kg)   SpO2 96%   BMI 34.61 kg/m  Wt Readings from Last 3 Encounters:  04/10/17 195 lb 6.4 oz (88.6 kg)  03/24/17 195 lb (88.5 kg)  03/15/17 201 lb (91.2 kg)    Physical Exam  Constitutional: She is oriented to person, place, and time. She appears well-developed and well-nourished. She appears ill.  HENT:  Head: Normocephalic.  Right Ear: Hearing, external ear and ear canal normal. A middle ear effusion is present.  Left Ear: Hearing, external ear and ear canal normal. A middle ear effusion is present.  Nose: Mucosal edema and rhinorrhea present. Right sinus exhibits no maxillary sinus tenderness  and no frontal sinus tenderness. Left sinus exhibits no maxillary sinus tenderness and no frontal sinus tenderness.  Mouth/Throat: Uvula is midline and mucous membranes are normal. Posterior oropharyngeal erythema present. No oropharyngeal exudate, posterior oropharyngeal edema or tonsillar abscesses.  Eyes: EOM are normal.  Neck: Normal range of motion. No thyromegaly present.  Cardiovascular: Normal rate and regular rhythm. Exam reveals no gallop and no friction rub.  No murmur heard. Pulmonary/Chest: Effort normal and breath sounds normal. No respiratory distress. She has no wheezes. She has no rales. She exhibits no tenderness.  Abdominal: Soft. Bowel sounds are normal.  Musculoskeletal: Normal range of motion.  Lymphadenopathy:    She has no cervical adenopathy.  Neurological: She is alert and oriented to person, place, and time.  Skin: Skin is warm and dry.  Psychiatric: She has a normal mood and affect. Her behavior is normal. Judgment and thought content normal.       Patient has been counseled extensively about nutrition and exercise as well as the importance of  adherence with medications and regular follow-up. The patient was given clear instructions to go to ER or return to medical center if symptoms don't improve, worsen or new problems develop. The patient verbalized understanding.   Follow-up: Return if symptoms worsen or fail to improve.   Gildardo Pounds, FNP-BC [facility] [place], [place]   04/11/2017, 9:04 PM

## 2017-04-10 NOTE — Patient Instructions (Addendum)
Buspirone tablets What is this medicine? BUSPIRONE (byoo SPYE rone) is used to treat anxiety disorders. This medicine may be used for other purposes; ask your health care provider or pharmacist if you have questions. COMMON BRAND NAME(S): BuSpar What should I tell my health care provider before I take this medicine? They need to know if you have any of these conditions: -kidney or liver disease -an unusual or allergic reaction to buspirone, other medicines, foods, dyes, or preservatives -pregnant or trying to get pregnant -breast-feeding How should I use this medicine? Take this medicine by mouth with a glass of water. Follow the directions on the prescription label. You may take this medicine with or without food. To ensure that this medicine always works the same way for you, you should take it either always with or always without food. Take your doses at regular intervals. Do not take your medicine more often than directed. Do not stop taking except on the advice of your doctor or health care professional. Talk to your pediatrician regarding the use of this medicine in children. Special care may be needed. Overdosage: If you think you have taken too much of this medicine contact a poison control center or emergency room at once. NOTE: This medicine is only for you. Do not share this medicine with others. What if I miss a dose? If you miss a dose, take it as soon as you can. If it is almost time for your next dose, take only that dose. Do not take double or extra doses. What may interact with this medicine? Do not take this medicine with any of the following medications: -linezolid -MAOIs like Carbex, Eldepryl, Marplan, Nardil, and Parnate -methylene blue -procarbazine This medicine may also interact with the following medications: -diazepam -digoxin -diltiazem -erythromycin -grapefruit juice -haloperidol -medicines for mental depression or mood problems -medicines for seizures like  carbamazepine, phenobarbital and phenytoin -nefazodone -other medications for anxiety -rifampin -ritonavir -some antifungal medicines like itraconazole, ketoconazole, and voriconazole -verapamil -warfarin This list may not describe all possible interactions. Give your health care provider a list of all the medicines, herbs, non-prescription drugs, or dietary supplements you use. Also tell them if you smoke, drink alcohol, or use illegal drugs. Some items may interact with your medicine. What should I watch for while using this medicine? Visit your doctor or health care professional for regular checks on your progress. It may take 1 to 2 weeks before your anxiety gets better. You may get drowsy or dizzy. Do not drive, use machinery, or do anything that needs mental alertness until you know how this drug affects you. Do not stand or sit up quickly, especially if you are an older patient. This reduces the risk of dizzy or fainting spells. Alcohol can make you more drowsy and dizzy. Avoid alcoholic drinks. What side effects may I notice from receiving this medicine? Side effects that you should report to your doctor or health care professional as soon as possible: -blurred vision or other vision changes -chest pain -confusion -difficulty breathing -feelings of hostility or anger -muscle aches and pains -numbness or tingling in hands or feet -ringing in the ears -skin rash and itching -vomiting -weakness Side effects that usually do not require medical attention (report to your doctor or health care professional if they continue or are bothersome): -disturbed dreams, nightmares -headache -nausea -restlessness or nervousness -sore throat and nasal congestion -stomach upset This list may not describe all possible side effects. Call your doctor for medical advice about side   effects. You may report side effects to FDA at 1-800-FDA-1088. Where should I keep my medicine? Keep out of the reach  of children. Store at room temperature below 30 degrees C (86 degrees F). Protect from light. Keep container tightly closed. Throw away any unused medicine after the expiration date. NOTE: This sheet is a summary. It may not cover all possible information. If you have questions about this medicine, talk to your doctor, pharmacist, or health care provider.  2018 Elsevier/Gold Standard (2009-09-03 18:06:11)  Generalized Anxiety Disorder, Adult Generalized anxiety disorder (GAD) is a mental health disorder. People with this condition constantly worry about everyday events. Unlike normal anxiety, worry related to GAD is not triggered by a specific event. These worries also do not fade or get better with time. GAD interferes with life functions, including relationships, work, and school. GAD can vary from mild to severe. People with severe GAD can have intense waves of anxiety with physical symptoms (panic attacks). What are the causes? The exact cause of GAD is not known. What increases the risk? This condition is more likely to develop in:  Women.  People who have a family history of anxiety disorders.  People who are very shy.  People who experience very stressful life events, such as the death of a loved one.  People who have a very stressful family environment.  What are the signs or symptoms? People with GAD often worry excessively about many things in their lives, such as their health and family. They may also be overly concerned about:  Doing well at work.  Being on time.  Natural disasters.  Friendships.  Physical symptoms of GAD include:  Fatigue.  Muscle tension or having muscle twitches.  Trembling or feeling shaky.  Being easily startled.  Feeling like your heart is pounding or racing.  Feeling out of breath or like you cannot take a deep breath.  Having trouble falling asleep or staying asleep.  Sweating.  Nausea, diarrhea, or irritable bowel syndrome  (IBS).  Headaches.  Trouble concentrating or remembering facts.  Restlessness.  Irritability.  How is this diagnosed? Your health care provider can diagnose GAD based on your symptoms and medical history. You will also have a physical exam. The health care provider will ask specific questions about your symptoms, including how severe they are, when they started, and if they come and go. Your health care provider may ask you about your use of alcohol or drugs, including prescription medicines. Your health care provider may refer you to a mental health specialist for further evaluation. Your health care provider will do a thorough examination and may perform additional tests to rule out other possible causes of your symptoms. To be diagnosed with GAD, a person must have anxiety that:  Is out of his or her control.  Affects several different aspects of his or her life, such as work and relationships.  Causes distress that makes him or her unable to take part in normal activities.  Includes at least three physical symptoms of GAD, such as restlessness, fatigue, trouble concentrating, irritability, muscle tension, or sleep problems.  Before your health care provider can confirm a diagnosis of GAD, these symptoms must be present more days than they are not, and they must last for six months or longer. How is this treated? The following therapies are usually used to treat GAD:  Medicine. Antidepressant medicine is usually prescribed for long-term daily control. Antianxiety medicines may be added in severe cases, especially when panic attacks  occur.  Talk therapy (psychotherapy). Certain types of talk therapy can be helpful in treating GAD by providing support, education, and guidance. Options include: ? Cognitive behavioral therapy (CBT). People learn coping skills and techniques to ease their anxiety. They learn to identify unrealistic or negative thoughts and behaviors and to replace them  with positive ones. ? Acceptance and commitment therapy (ACT). This treatment teaches people how to be mindful as a way to cope with unwanted thoughts and feelings. ? Biofeedback. This process trains you to manage your body's response (physiological response) through breathing techniques and relaxation methods. You will work with a therapist while machines are used to monitor your physical symptoms.  Stress management techniques. These include yoga, meditation, and exercise.  A mental health specialist can help determine which treatment is best for you. Some people see improvement with one type of therapy. However, other people require a combination of therapies. Follow these instructions at home:  Take over-the-counter and prescription medicines only as told by your health care provider.  Try to maintain a normal routine.  Try to anticipate stressful situations and allow extra time to manage them.  Practice any stress management or self-calming techniques as taught by your health care provider.  Do not punish yourself for setbacks or for not making progress.  Try to recognize your accomplishments, even if they are small.  Keep all follow-up visits as told by your health care provider. This is important. Contact a health care provider if:  Your symptoms do not get better.  Your symptoms get worse.  You have signs of depression, such as: ? A persistently sad, cranky, or irritable mood. ? Loss of enjoyment in activities that used to bring you joy. ? Change in weight or eating. ? Changes in sleeping habits. ? Avoiding friends or family members. ? Loss of energy for normal tasks. ? Feelings of guilt or worthlessness. Get help right away if:  You have serious thoughts about hurting yourself or others. If you ever feel like you may hurt yourself or others, or have thoughts about taking your own life, get help right away. You can go to your nearest emergency department or  call:  Your local emergency services (911 in the [place]  A suicide crisis helpline, such as the [facility] at (724)872-2961. This is open 24 hours a day.  Summary  Generalized anxiety disorder (GAD) is a mental health disorder that involves worry that is not triggered by a specific event.  People with GAD often worry excessively about many things in their lives, such as their health and family.  GAD may cause physical symptoms such as restlessness, trouble concentrating, sleep problems, frequent sweating, nausea, diarrhea, headaches, and trembling or muscle twitching.  A mental health specialist can help determine which treatment is best for you. Some people see improvement with one type of therapy. However, other people require a combination of therapies. This information is not intended to replace advice given to you by your health care provider. Make sure you discuss any questions you have with your health care provider. Document Released: 05/21/2012 Document Revised: 12/15/2015 Document Reviewed: 12/15/2015 Elsevier Interactive Patient Education  2018 [place] After being diagnosed with an anxiety disorder, you may be relieved to know why you have felt or behaved a certain way. It is natural to also feel overwhelmed about the treatment ahead and what it will mean for your life. With care and support, you can manage this condition and  recover from it. How to cope with anxiety Dealing with stress Stress is your body's reaction to life changes and events, both good and bad. Stress can last just a few hours or it can be ongoing. Stress can play a major role in anxiety, so it is important to learn both how to cope with stress and how to think about it differently. Talk with your health care provider or a counselor to learn more about stress reduction. He or she may suggest some stress reduction techniques, such as:  Music therapy. This  can include creating or listening to music that you enjoy and that inspires you.  Mindfulness-based meditation. This involves being aware of your normal breaths, rather than trying to control your breathing. It can be done while sitting or walking.  Centering prayer. This is a kind of meditation that involves focusing on a word, phrase, or sacred image that is meaningful to you and that brings you peace.  Deep breathing. To do this, expand your stomach and inhale slowly through your nose. Hold your breath for 3-5 seconds. Then exhale slowly, allowing your stomach muscles to relax.  Self-talk. This is a skill where you identify thought patterns that lead to anxiety reactions and correct those thoughts.  Muscle relaxation. This involves tensing muscles then relaxing them.  Choose a stress reduction technique that fits your lifestyle and personality. Stress reduction techniques take time and practice. Set aside 5-15 minutes a day to do them. Therapists can offer training in these techniques. The training may be covered by some insurance plans. Other things you can do to manage stress include:  Keeping a stress diary. This can help you learn what triggers your stress and ways to control your response.  Thinking about how you respond to certain situations. You may not be able to control everything, but you can control your reaction.  Making time for activities that help you relax, and not feeling guilty about spending your time in this way.  Therapy combined with coping and stress-reduction skills provides the best chance for successful treatment. Medicines Medicines can help ease symptoms. Medicines for anxiety include:  Anti-anxiety drugs.  Antidepressants.  Beta-blockers.  Medicines may be used as the main treatment for anxiety disorder, along with therapy, or if other treatments are not working. Medicines should be prescribed by a health care provider. Relationships Relationships can  play a big part in helping you recover. Try to spend more time connecting with trusted friends and family members. Consider going to couples counseling, taking family education classes, or going to family therapy. Therapy can help you and others better understand the condition. How to recognize changes in your condition Everyone has a different response to treatment for anxiety. Recovery from anxiety happens when symptoms decrease and stop interfering with your daily activities at home or work. This may mean that you will start to:  Have better concentration and focus.  Sleep better.  Be less irritable.  Have more energy.  Have improved memory.  It is important to recognize when your condition is getting worse. Contact your health care provider if your symptoms interfere with home or work and you do not feel like your condition is improving. Where to find help and support: You can get help and support from these sources:  Self-help groups.  Online and [employer].  A trusted spiritual leader.  Couples counseling.  Family education classes.  Family therapy.  Follow these instructions at home:  Eat a healthy diet that includes  plenty of vegetables, fruits, whole grains, low-fat dairy products, and lean protein. Do not eat a lot of foods that are high in solid fats, added sugars, or salt.  Exercise. Most adults should do the following: ? Exercise for at least 150 minutes each week. The exercise should increase your heart rate and make you sweat (moderate-intensity exercise). ? Strengthening exercises at least twice a week.  Cut down on caffeine, tobacco, alcohol, and other potentially harmful substances.  Get the right amount and quality of sleep. Most adults need 7-9 hours of sleep each night.  Make choices that simplify your life.  Take over-the-counter and prescription medicines only as told by your health care provider.  Avoid caffeine, alcohol, and certain  over-the-counter cold medicines. These may make you feel worse. Ask your pharmacist which medicines to avoid.  Keep all follow-up visits as told by your health care provider. This is important. Questions to ask your health care provider  Would I benefit from therapy?  How often should I follow up with a health care provider?  How long do I need to take medicine?  Are there any long-term side effects of my medicine?  Are there any alternatives to taking medicine? Contact a health care provider if:  You have a hard time staying focused or finishing daily tasks.  You spend many hours a day feeling worried about everyday life.  You become exhausted by worry.  You start to have headaches, feel tense, or have nausea.  You urinate more than normal.  You have diarrhea. Get help right away if:  You have a racing heart and shortness of breath.  You have thoughts of hurting yourself or others. If you ever feel like you may hurt yourself or others, or have thoughts about taking your own life, get help right away. You can go to your nearest emergency department or call:  Your local emergency services (911 in the [place]  A suicide crisis helpline, such as the [place] at 934-462-4994. This is open 24-hours a day.  Summary  Taking steps to deal with stress can help calm you.  Medicines cannot cure anxiety disorders, but they can help ease symptoms.  Family, friends, and partners can play a big part in helping you recover from an anxiety disorder. This information is not intended to replace advice given to you by your health care provider. Make sure you discuss any questions you have with your health care provider. Document Released: 01/19/2016 Document Revised: 01/19/2016 Document Reviewed: 01/19/2016 Elsevier Interactive Patient Education  [employer].

## 2017-04-10 NOTE — Telephone Encounter (Signed)
Met with the patient when she was in the clinic today. She had questions about Legal Aid as she stated that she was denied medicaid and is not sure why.  She completed a referral for [facility] and the referral was faxed to Abelino Derrick # [phone]

## 2017-04-11 ENCOUNTER — Telehealth: Payer: Self-pay | Admitting: Nurse Practitioner

## 2017-04-11 ENCOUNTER — Encounter: Payer: Self-pay | Admitting: Nurse Practitioner

## 2017-04-11 MED ORDER — BUSPIRONE HCL 5 MG PO TABS: 15.0000 mg | ORAL_TABLET | Freq: Three times a day (TID) | ORAL | 1 refills | Status: AC

## 2017-04-11 NOTE — Telephone Encounter (Signed)
Patient called and requested for lu results. Please fu.

## 2017-04-11 NOTE — Telephone Encounter (Signed)
Will forward to PCP 

## 2017-04-11 NOTE — Telephone Encounter (Signed)
Flu

## 2017-04-12 ENCOUNTER — Telehealth: Payer: Self-pay

## 2017-04-12 ENCOUNTER — Other Ambulatory Visit: Payer: Self-pay | Admitting: Nurse Practitioner

## 2017-04-12 LAB — INFLUENZA A AND B, RT PCR
Influenza A PCR: NEGATIVE
Influenza B PCR: NEGATIVE

## 2017-04-12 NOTE — Telephone Encounter (Signed)
Results are not back at this time 

## 2017-04-12 NOTE — Telephone Encounter (Signed)
This CM spoke to Abelino Derrick, [facility]  to inquire about the status of the referral. It was just received and they have not yet contacted the patient.

## 2017-04-14 NOTE — Telephone Encounter (Signed)
CMA called patient to inform lab results were negative for Flu. Pt's understood and stated she still don't feel better, and everyone in her house is sick.

## 2017-04-17 ENCOUNTER — Ambulatory Visit: Payer: Self-pay | Attending: Nurse Practitioner

## 2017-04-17 ENCOUNTER — Other Ambulatory Visit: Payer: Self-pay | Admitting: Nurse Practitioner

## 2017-04-17 MED ORDER — AZITHROMYCIN 250 MG PO TABS: ORAL_TABLET | ORAL | 0 refills | Status: DC

## 2017-04-17 NOTE — Telephone Encounter (Signed)
I have sent in a z pack to the pharmacy for her to take. It was sent to [facility]

## 2017-04-18 NOTE — Telephone Encounter (Signed)
CMA spoke to patient and inform her Rx has been sent.  Patient understood and stated she feels a little better now.

## 2017-04-26 MED FILL — ESCITALOPRAM 20 MG TABLET: 20 | 30 days supply | Qty: 30 | Fill #1

## 2017-05-02 MED FILL — CLINDAMYCIN HCL 150 MG CAPS: 150 | 7 days supply | Qty: 28 | Fill #0

## 2017-05-03 ENCOUNTER — Ambulatory Visit: Payer: Self-pay | Admitting: Nurse Practitioner

## 2017-05-10 ENCOUNTER — Telehealth: Payer: Self-pay

## 2017-05-10 NOTE — Telephone Encounter (Signed)
As per Abelino Derrick, [employer] [facility], a decision is pending regarding medicaid

## 2017-05-29 ENCOUNTER — Ambulatory Visit: Payer: Self-pay | Admitting: Nurse Practitioner

## 2017-05-29 MED FILL — ESCITALOPRAM 20 MG TABLET: 20 | 30 days supply | Qty: 30 | Fill #2

## 2017-05-31 ENCOUNTER — Ambulatory Visit: Payer: Self-pay | Attending: Nurse Practitioner | Admitting: Physician Assistant

## 2017-05-31 VITALS — BP 110/68 | HR 75 | Temp 98.5°F | Resp 18 | Ht 67.0 in | Wt 197.0 lb

## 2017-05-31 DIAGNOSIS — E039 Hypothyroidism, unspecified: Secondary | ICD-10-CM | POA: Insufficient documentation

## 2017-05-31 DIAGNOSIS — F419 Anxiety disorder, unspecified: Secondary | ICD-10-CM | POA: Insufficient documentation

## 2017-05-31 DIAGNOSIS — K219 Gastro-esophageal reflux disease without esophagitis: Secondary | ICD-10-CM | POA: Insufficient documentation

## 2017-05-31 DIAGNOSIS — Z79899 Other long term (current) drug therapy: Secondary | ICD-10-CM | POA: Insufficient documentation

## 2017-05-31 DIAGNOSIS — Z880 Allergy status to penicillin: Secondary | ICD-10-CM | POA: Insufficient documentation

## 2017-05-31 DIAGNOSIS — Z79890 Hormone replacement therapy (postmenopausal): Secondary | ICD-10-CM | POA: Insufficient documentation

## 2017-05-31 DIAGNOSIS — F329 Major depressive disorder, single episode, unspecified: Secondary | ICD-10-CM | POA: Insufficient documentation

## 2017-05-31 DIAGNOSIS — L03316 Cellulitis of umbilicus: Secondary | ICD-10-CM | POA: Insufficient documentation

## 2017-05-31 MED ORDER — LEVOTHYROXINE SODIUM 150 MCG PO TABS: 150.0000 ug | ORAL_TABLET | Freq: Every day | ORAL | 3 refills | Status: DC

## 2017-05-31 MED ORDER — LEVOTHYROXINE SODIUM 75 MCG PO TABS: 75.0000 ug | ORAL_TABLET | Freq: Every day | ORAL | 4 refills | Status: DC

## 2017-05-31 MED ORDER — MUPIROCIN 2 % EX OINT: TOPICAL_OINTMENT | CUTANEOUS | 0 refills | Status: DC

## 2017-05-31 MED FILL — LEVOTHYROXINE 150 MCG TAB: 150 | 30 days supply | Qty: 30 | Fill #0

## 2017-05-31 MED FILL — MUPIROCIN 2% OINTMENT: 2 | 10 days supply | Qty: 22 | Fill #0

## 2017-05-31 NOTE — Progress Notes (Signed)
Andrea Blackwell, is a 62 y.o. female  GXQ:[id number]  [id number]:[id number]  DOB - 26-Jan-1956  Subjective:  Chief Complaint and HPI: Andrea Blackwell is a 62 y.o. female here today for resolving infection and redness around her umbilicus.  She used neosporin which seemed to help.  No f/c.     ROS:   Constitutional:  No f/c, No night sweats, No unexplained weight loss. EENT:  No vision changes, No blurry vision, No hearing changes. No mouth, throat, or ear problems.  Respiratory: No cough, No SOB Cardiac: No CP, no palpitations GI:  No abd pain, No N/V/D. GU: No Urinary s/sx Musculoskeletal: No joint pain Neuro: No headache, no dizziness, no motor weakness.  Skin: No rash Endocrine:  No polydipsia. No polyuria.  Psych: Denies SI/HI  No problems updated.  ALLERGIES: Allergies  Allergen Reactions  . Penicillins Anaphylaxis and Other (See Comments)    Has patient had a PCN reaction causing immediate rash, facial/tongue/throat swelling, SOB or lightheadedness with hypotension: yes Has patient had a PCN reaction causing severe rash involving mucus membranes or skin necrosis: no Has patient had a PCN reaction that required hospitalization: yes Has patient had a PCN reaction occurring within the last 10 years: no If all of the above answers are "NO", then may proceed with Cephalosporin use.     PAST MEDICAL HISTORY: Past Medical History:  Diagnosis Date  . Allergy   . Anxiety   . Cataract   . Depression   . GERD (gastroesophageal reflux disease)   . Hypothyroidism   . Pancreatitis     MEDICATIONS AT HOME: Prior to Admission medications   Medication Sig Start Date End Date Taking? Authorizing Provider  acetaminophen (TYLENOL) 500 MG tablet Take 500 mg by mouth every 6 (six) hours as needed.    [provider]  Ascorbic Acid (VITAMIN C PO) Take 1 tablet by mouth daily.    [provider]  azithromycin (ZITHROMAX) 250 MG tablet Take 2 (two) tablets by mouth on day 1 then  take 1 (one) tablet by mouth on days 2-5. 04/17/17   Gildardo Pounds, NP  cetirizine (ZYRTEC) 10 MG chewable tablet Chew 10 mg by mouth daily.    [provider]  escitalopram (LEXAPRO) 20 MG tablet Take 1 tablet (20 mg total) by mouth at bedtime. 02/01/17 05/02/17  Gildardo Pounds, NP  fluticasone (FLONASE) 50 MCG/ACT nasal spray Place 2 sprays into both nostrils daily. Patient not taking: Reported on 03/24/2017 03/01/17   Gildardo Pounds, NP  levothyroxine (LEVOXYL) 150 MCG tablet Take 1 tablet (150 mcg total) by mouth daily before breakfast. 05/31/17   Argentina Donovan, PA-C  Multiple Vitamin (MULTIVITAMIN WITH MINERALS) TABS tablet Take 1 tablet by mouth daily.    [provider]  mupirocin ointment (BACTROBAN) 2 % Apply bid X 1 week to AA 05/31/17   Argentina Donovan, PA-C  ondansetron (ZOFRAN) 4 MG tablet Take 1 tablet (4 mg total) every 8 (eight) hours as needed by mouth for nausea or vomiting. Patient not taking: Reported on 03/24/2017 12/16/16   Jonetta Osgood, MD  pantoprazole (PROTONIX) 40 MG tablet Take 1 tablet (40 mg total) by mouth daily. Patient not taking: Reported on 03/24/2017 01/04/17 04/04/17  Gildardo Pounds, NP  Probiotic CAPS Take 1 capsule by mouth daily.    [provider]  triamcinolone ointment (KENALOG) 0.5 % Apply 1 application topically 2 (two) times daily. Patient not taking: Reported on 03/15/2017 03/01/17  Gildardo Pounds, NP     Objective:  EXAM:   There were no vitals filed for this visit.  General appearance : A&OX3. NAD. Non-toxic-appearing HEENT: Atraumatic and Normocephalic.  PERRLA.  Neck: supple, no JVD. No cervical lymphadenopathy. No thyromegaly Chest/Lungs:  Breathing-non-labored, Good air entry bilaterally, breath sounds normal without rales, rhonchi, or wheezing  CVS: S1 S2 regular, no murmurs, gallops, rubs  Abdomen: Bowel sounds present, Non tender and not distended with no gaurding, rigidity or rebound.  No  erythema around her umbilicus currently Extremities: Bilateral Lower Ext shows no edema, both legs are warm to touch with = pulse throughout Neurology:  CN II-XII grossly intact, Non focal.   Psych:  TP linear. J/I WNL. Normal speech. Appropriate eye contact and affect.  Skin:  No Rash  Data Review Lab Results  Component Value Date   HGBA1C 5.5 01/04/2017     Assessment & Plan   1. Cellulitis of umbilicus If needed she can use mupiricin and stop neosporin-however, currently, I dont think she needs to apply anything as it seems to have resolved - mupirocin ointment (BACTROBAN) 2 %; Apply bid X 1 week to AA  Dispense: 22 g; Refill: 0  Patient told me she was supposed to be taking 135mcg of levoxyl and that her dosing was inadvertently changed at her hospital stay in November 2018.  She has been taking her ex-husband's prescription at 18mcg of levoxyl.  I sent her a prescription for this.  However, upon reviewing her hospital records, I do not see the 161mcg ever documented, so we have recommended she take the 7mcg(1/2 tablet of the levoxyl 130mcg bc she had already picked up the prescription and paid for it).  Per her medical record, I do not see documentation at higher than 71mcg.  Take 65mcg and we will recheck the level in 2 months.    Patient have been counseled extensively about nutrition and exercise  Return in about 2 months (around 07/31/2017) for [name]; TSH check.  The patient was given clear instructions to go to ER or return to medical center if symptoms don't improve, worsen or new problems develop. The patient verbalized understanding. The patient was told to call to get lab results if they haven't heard anything in the next week.     Freeman Caldron, PA-C [facility] and [place], [place]   05/31/2017, 3:13 PM

## 2017-06-12 ENCOUNTER — Ambulatory Visit: Payer: Self-pay | Attending: Internal Medicine | Admitting: Internal Medicine

## 2017-06-12 ENCOUNTER — Encounter: Payer: Self-pay | Admitting: Internal Medicine

## 2017-06-12 VITALS — BP 102/66 | HR 67 | Temp 98.6°F | Resp 16 | Wt 196.4 lb

## 2017-06-12 DIAGNOSIS — Z7951 Long term (current) use of inhaled steroids: Secondary | ICD-10-CM | POA: Insufficient documentation

## 2017-06-12 DIAGNOSIS — Z6830 Body mass index (BMI) 30.0-30.9, adult: Secondary | ICD-10-CM | POA: Insufficient documentation

## 2017-06-12 DIAGNOSIS — Z79899 Other long term (current) drug therapy: Secondary | ICD-10-CM | POA: Insufficient documentation

## 2017-06-12 DIAGNOSIS — F419 Anxiety disorder, unspecified: Secondary | ICD-10-CM | POA: Insufficient documentation

## 2017-06-12 DIAGNOSIS — Z79890 Hormone replacement therapy (postmenopausal): Secondary | ICD-10-CM | POA: Insufficient documentation

## 2017-06-12 DIAGNOSIS — L988 Other specified disorders of the skin and subcutaneous tissue: Secondary | ICD-10-CM | POA: Insufficient documentation

## 2017-06-12 DIAGNOSIS — E669 Obesity, unspecified: Secondary | ICD-10-CM | POA: Insufficient documentation

## 2017-06-12 DIAGNOSIS — Z87891 Personal history of nicotine dependence: Secondary | ICD-10-CM | POA: Insufficient documentation

## 2017-06-12 DIAGNOSIS — R238 Other skin changes: Secondary | ICD-10-CM

## 2017-06-12 DIAGNOSIS — E039 Hypothyroidism, unspecified: Secondary | ICD-10-CM | POA: Insufficient documentation

## 2017-06-12 DIAGNOSIS — F329 Major depressive disorder, single episode, unspecified: Secondary | ICD-10-CM | POA: Insufficient documentation

## 2017-06-12 NOTE — Progress Notes (Signed)
Patient ID: Andrea Blackwell, female    DOB: [birth year]  MRN: [id number]  CC: Cellulitis   Subjective: Andrea Blackwell is a 62 y.o. female who presents for UC visit. Her concerns today include:   Seen 1.5 wk ago for irritation of skin around umbilicus.  Reports being dx with cellulitis and told to use Bactroban ointment. Reports using this and cleaning umbilicus with Q-tip but feels it is not getting better.  Area still red.  Reports using some type of oil to clean umbilicus prior to this.  No drainage, abdominal pain, fever.  She is very anxious about her symptoms.  Patient Active Problem List   Diagnosis Date Noted  . Encounter for removal of biliary stent   . Anxiety and depression 02/01/2017  . Bile duct leak   . Acute calculous cholecystitis 12/13/2016  . Jaundice 12/13/2016  . Obesity 12/13/2016  . Hypothyroidism   . Gallstone pancreatitis 12/10/2016     Current Outpatient Medications on File Prior to Visit  Medication Sig Dispense Refill  . acetaminophen (TYLENOL) 500 MG tablet Take 500 mg by mouth every 6 (six) hours as needed.    . Ascorbic Acid (VITAMIN C PO) Take 1 tablet by mouth daily.    . cetirizine (ZYRTEC) 10 MG chewable tablet Chew 10 mg by mouth daily.    Marland Kitchen escitalopram (LEXAPRO) 20 MG tablet Take 1 tablet (20 mg total) by mouth at bedtime. 90 tablet 1  . fluticasone (FLONASE) 50 MCG/ACT nasal spray Place 2 sprays into both nostrils daily. 16 g 6  . levothyroxine (SYNTHROID) 75 MCG tablet Take 1 tablet (75 mcg total) by mouth daily before breakfast. 30 tablet 4  . Multiple Vitamin (MULTIVITAMIN WITH MINERALS) TABS tablet Take 1 tablet by mouth daily.    . mupirocin ointment (BACTROBAN) 2 % Apply bid X 1 week to AA 22 g 0  . ondansetron (ZOFRAN) 4 MG tablet Take 1 tablet (4 mg total) every 8 (eight) hours as needed by mouth for nausea or vomiting. (Patient not taking: Reported on 06/12/2017) 20 tablet 0  . pantoprazole (PROTONIX) 40 MG tablet Take 1 tablet (40 mg total) by  mouth daily. (Patient not taking: Reported on 03/24/2017) 90 tablet 1  . Probiotic CAPS Take 1 capsule by mouth daily.    Marland Kitchen triamcinolone ointment (KENALOG) 0.5 % Apply 1 application topically 2 (two) times daily. (Patient not taking: Reported on 03/15/2017) 30 g 0   Current Facility-Administered Medications on File Prior to Visit  Medication Dose Route Frequency Provider Last Rate Last Dose  . 0.9 %  sodium chloride infusion  500 mL Intravenous Once Milus Banister, MD        Allergies  Allergen Reactions  . Penicillins Anaphylaxis and Other (See Comments)    Has patient had a PCN reaction causing immediate rash, facial/tongue/throat swelling, SOB or lightheadedness with hypotension: yes Has patient had a PCN reaction causing severe rash involving mucus membranes or skin necrosis: no Has patient had a PCN reaction that required hospitalization: yes Has patient had a PCN reaction occurring within the last 10 years: no If all of the above answers are "NO", then may proceed with Cephalosporin use.     Social History   Socioeconomic History  . Marital status: Divorced    Spouse name: Not on file  . Number of children: Not on file  . Years of education: Not on file  . Highest education level: Not on file  Occupational History  . Not  on file  Social Needs  . Financial resource strain: Not on file  . Food insecurity:    Worry: Not on file    Inability: Not on file  . Transportation needs:    Medical: Not on file    Non-medical: Not on file  Tobacco Use  . Smoking status: Former Smoker    Last attempt to quit: 1995    Years since quitting: 24.3  . Smokeless tobacco: Never Used  Substance and Sexual Activity  . Alcohol use: Yes    Comment: occasional  . Drug use: No  . Sexual activity: Never  Lifestyle  . Physical activity:    Days per week: 2 days    Minutes per session: 30 min  . Stress: Only a little  Relationships  . Social connections:    Talks on phone: More than  three times a week    Gets together: More than three times a week    Attends religious service: More than 4 times per year    Active member of club or organization: No    Attends meetings of clubs or organizations: Never    Relationship status: Divorced  . Intimate partner violence:    Fear of current or ex partner: No    Emotionally abused: No    Physically abused: No    Forced sexual activity: No  Other Topics Concern  . Not on file  Social History Narrative  . Not on file    Family History  Problem Relation Age of Onset  . Diabetes Maternal Grandfather   . Colon cancer Neg Hx   . Esophageal cancer Neg Hx   . Liver cancer Neg Hx   . Pancreatic cancer Neg Hx   . Rectal cancer Neg Hx   . Stomach cancer Neg Hx   . Breast cancer Neg Hx   . Prostate cancer Neg Hx     Past Surgical History:  Procedure Laterality Date  . CHOLECYSTECTOMY    . ENDOSCOPIC RETROGRADE CHOLANGIOPANCREATOGRAPHY (ERCP) WITH PROPOFOL N/A 02/02/2017   Procedure: ENDOSCOPIC RETROGRADE CHOLANGIOPANCREATOGRAPHY (ERCP) WITH PROPOFOL;  Surgeon: Milus Banister, MD;  Location: [facility];  Service: Endoscopy;  Laterality: N/A;  . ERCP N/A 12/10/2016   Procedure: ENDOSCOPIC RETROGRADE CHOLANGIOPANCREATOGRAPHY (ERCP);  Surgeon: Carol Ada, MD;  Location: [facility];  Service: Endoscopy;  Laterality: N/A;  . ERCP N/A 12/14/2016   Procedure: ENDOSCOPIC RETROGRADE CHOLANGIOPANCREATOGRAPHY (ERCP);  Surgeon: Milus Banister, MD;  Location: [facility];  Service: Endoscopy;  Laterality: N/A;  . LAPAROSCOPIC CHOLECYSTECTOMY SINGLE SITE WITH INTRAOPERATIVE CHOLANGIOGRAM N/A 12/13/2016   Procedure: LAPAROSCOPIC CHOLECYSTECTOMY SINGLE SITE AND LIVER BIOPSY;  Surgeon: Michael Boston, MD;  Location: [facility];  Service: General;  Laterality: N/A;  . OVARY SURGERY      ROS: Review of Systems Neg except as above  PHYSICAL EXAM: BP 102/66   Pulse 67   Temp 98.6 F (37 C) (Oral)   Resp 16   Wt 196 lb 6.4 oz (89.1  kg)   SpO2 97%   BMI 30.76 kg/m   Physical Exam  General appearance - alert, well appearing, and in no distress Mental status -pt very anxious Skin - Umbilicus: no erythema, edema, warmth or tenderness.    Picture of Umbilicus  ASSESSMENT AND PLAN: 1. Skin irritation -reassurance given.  Advised to stop the abx ointment and stop excessive cleaning with Q-tip.  Patient was given the opportunity to ask questions.  Patient verbalized understanding of the plan and was able to  repeat key elements of the plan.   No orders of the defined types were placed in this encounter.    Requested Prescriptions    No prescriptions requested or ordered in this encounter    Return if symptoms worsen or fail to improve.  Karle Plumber, MD, FACP

## 2017-06-12 NOTE — Patient Instructions (Signed)
Stop using the  Antibiotic cream.  Stop all manipulation of umbilicus.

## 2017-07-12 ENCOUNTER — Telehealth: Payer: Self-pay

## 2017-07-12 NOTE — Telephone Encounter (Signed)
As per Abelino Derrick, [employer], the decision about medicaid is still pending.

## 2017-07-17 ENCOUNTER — Ambulatory Visit: Payer: Self-pay | Attending: Family Medicine

## 2017-07-17 MED FILL — ESCITALOPRAM 20 MG TABLET: 20 | 30 days supply | Qty: 30 | Fill #3

## 2017-07-17 MED FILL — LEVOTHYROXINE 150 MCG TAB: 150 | 30 days supply | Qty: 30 | Fill #1

## 2017-07-28 ENCOUNTER — Telehealth: Payer: Self-pay | Admitting: Nurse Practitioner

## 2017-07-28 NOTE — Telephone Encounter (Signed)
Patient called you back.

## 2017-08-01 ENCOUNTER — Telehealth: Payer: Self-pay

## 2017-08-01 ENCOUNTER — Ambulatory Visit: Payer: Self-pay | Attending: Nurse Practitioner | Admitting: Nurse Practitioner

## 2017-08-01 ENCOUNTER — Encounter: Payer: Self-pay | Admitting: Nurse Practitioner

## 2017-08-01 VITALS — BP 103/69 | HR 65 | Temp 99.2°F | Ht 67.0 in | Wt 192.0 lb

## 2017-08-01 DIAGNOSIS — R899 Unspecified abnormal finding in specimens from other organs, systems and tissues: Secondary | ICD-10-CM

## 2017-08-01 DIAGNOSIS — Z880 Allergy status to penicillin: Secondary | ICD-10-CM | POA: Insufficient documentation

## 2017-08-01 DIAGNOSIS — F329 Major depressive disorder, single episode, unspecified: Secondary | ICD-10-CM | POA: Insufficient documentation

## 2017-08-01 DIAGNOSIS — Z9889 Other specified postprocedural states: Secondary | ICD-10-CM | POA: Insufficient documentation

## 2017-08-01 DIAGNOSIS — Z79899 Other long term (current) drug therapy: Secondary | ICD-10-CM | POA: Insufficient documentation

## 2017-08-01 DIAGNOSIS — F419 Anxiety disorder, unspecified: Secondary | ICD-10-CM | POA: Insufficient documentation

## 2017-08-01 DIAGNOSIS — Z9049 Acquired absence of other specified parts of digestive tract: Secondary | ICD-10-CM | POA: Insufficient documentation

## 2017-08-01 DIAGNOSIS — E039 Hypothyroidism, unspecified: Secondary | ICD-10-CM | POA: Insufficient documentation

## 2017-08-01 DIAGNOSIS — R799 Abnormal finding of blood chemistry, unspecified: Secondary | ICD-10-CM | POA: Insufficient documentation

## 2017-08-01 DIAGNOSIS — Z833 Family history of diabetes mellitus: Secondary | ICD-10-CM | POA: Insufficient documentation

## 2017-08-01 DIAGNOSIS — K219 Gastro-esophageal reflux disease without esophagitis: Secondary | ICD-10-CM | POA: Insufficient documentation

## 2017-08-01 MED ORDER — LEVOTHYROXINE SODIUM 75 MCG PO TABS
75.0000 ug | ORAL_TABLET | Freq: Every day | ORAL | 1 refills | Status: DC
Start: 2017-08-01 — End: 2017-08-01

## 2017-08-01 MED ORDER — BUSPIRONE HCL 15 MG PO TABS: 15.0000 mg | ORAL_TABLET | Freq: Three times a day (TID) | ORAL | 1 refills | Status: DC

## 2017-08-01 MED ORDER — ESCITALOPRAM OXALATE 20 MG PO TABS: 20.0000 mg | ORAL_TABLET | Freq: Every day | ORAL | 1 refills | Status: DC

## 2017-08-01 MED FILL — busPIRone HCL 15 MG TABS: 15 | 30 days supply | Qty: 90 | Fill #0

## 2017-08-01 NOTE — Telephone Encounter (Signed)
This CM met with the patient when she was in the clinic today. Informed her that this CM didn't try to contact her on 07/28/17. She then said that it was probably a reminder call about her appointment today.

## 2017-08-01 NOTE — Progress Notes (Signed)
Assessment & Plan:  Andrea Blackwell was seen today for follow-up.  Diagnoses and all orders for this visit:  Anxiety and depression -     escitalopram (LEXAPRO) 20 MG tablet; Take 1 tablet (20 mg total) by mouth at bedtime. -     busPIRone (BUSPAR) 15 MG tablet; Take 1 tablet (15 mg total) by mouth 3 (three) times daily. Do not skip or miss any doses unless otherwise notified by provider   Hypothyroidism, unspecified type -     TSH  Abnormal laboratory test result -     CMP14+EGFR     Patient has been counseled on age-appropriate routine health concerns for screening and prevention. These are reviewed and up-to-date. Referrals have been placed accordingly. Immunizations are up-to-date or declined.    Subjective:   Chief Complaint  Patient presents with  . Follow-up    Pt. is here for thyroid re-check. Pt.said her umbilical infection is much better.    HPI Andrea Blackwell 62 y.o. female presents to office today for follow-up to anxiety and depression as well as for repeat TSH due to her chronic history of hypothyroidism.  Anxiety and Depression Well controlled on BuSpar as needed and Lexapro 20 mg daily.  She denies any current thoughts of suicidal ideation. She is recently divorced after a very stressful divorce process.  Depression screen PHQ 2/9 04/10/2017  Decreased Interest 0  Down, Depressed, Hopeless 1  PHQ - 2 Score 1  Altered sleeping 3  Tired, decreased energy 3  Change in appetite 3  Feeling bad or failure about yourself  0  Trouble concentrating 1  Moving slowly or fidgety/restless 0  Suicidal thoughts 0  PHQ-9 Score 11     Hypothyroidism Patient presents for follow up of thyroid function.  She currently denies any  symptoms of hypo-or hyperthyroidism.  The problem has been stable.  Previous thyroid studies include TSH. The hypothyroidism is due to hypothyroidism.  Review of Systems  Constitutional: Negative for fever, malaise/fatigue and weight loss.  HENT: Negative.   Negative for nosebleeds.   Eyes: Negative.  Negative for blurred vision, double vision and photophobia.  Respiratory: Negative.  Negative for cough and shortness of breath.   Cardiovascular: Negative.  Negative for chest pain, palpitations and leg swelling.  Gastrointestinal: Negative.  Negative for heartburn, nausea and vomiting.  Musculoskeletal: Negative.  Negative for myalgias.  Neurological: Negative.  Negative for dizziness, focal weakness, seizures and headaches.  Psychiatric/Behavioral: Positive for depression. Negative for suicidal ideas. The patient is nervous/anxious.     Past Medical History:  Diagnosis Date  . Allergy   . Anxiety   . Cataract   . Depression   . GERD (gastroesophageal reflux disease)   . Hypothyroidism   . Pancreatitis     Past Surgical History:  Procedure Laterality Date  . CHOLECYSTECTOMY    . ENDOSCOPIC RETROGRADE CHOLANGIOPANCREATOGRAPHY (ERCP) WITH PROPOFOL N/A 02/02/2017   Procedure: ENDOSCOPIC RETROGRADE CHOLANGIOPANCREATOGRAPHY (ERCP) WITH PROPOFOL;  Surgeon: Milus Banister, MD;  Location: [facility];  Service: Endoscopy;  Laterality: N/A;  . ERCP N/A 12/10/2016   Procedure: ENDOSCOPIC RETROGRADE CHOLANGIOPANCREATOGRAPHY (ERCP);  Surgeon: Carol Ada, MD;  Location: [facility];  Service: Endoscopy;  Laterality: N/A;  . ERCP N/A 12/14/2016   Procedure: ENDOSCOPIC RETROGRADE CHOLANGIOPANCREATOGRAPHY (ERCP);  Surgeon: Milus Banister, MD;  Location: [facility];  Service: Endoscopy;  Laterality: N/A;  . LAPAROSCOPIC CHOLECYSTECTOMY SINGLE SITE WITH INTRAOPERATIVE CHOLANGIOGRAM N/A 12/13/2016   Procedure: LAPAROSCOPIC CHOLECYSTECTOMY SINGLE SITE AND LIVER BIOPSY;  Surgeon:  Michael Boston, MD;  Location: [facility];  Service: General;  Laterality: N/A;  . OVARY SURGERY      Family History  Problem Relation Age of Onset  . Diabetes Maternal Grandfather   . Colon cancer Neg Hx   . Esophageal cancer Neg Hx   . Liver cancer Neg Hx   . Pancreatic  cancer Neg Hx   . Rectal cancer Neg Hx   . Stomach cancer Neg Hx   . Breast cancer Neg Hx   . Prostate cancer Neg Hx     Social History Reviewed with no changes to be made today.   Outpatient Medications Prior to Visit  Medication Sig Dispense Refill  . Probiotic CAPS Take 1 capsule by mouth daily.    Marland Kitchen levothyroxine (SYNTHROID) 75 MCG tablet Take 1 tablet (75 mcg total) by mouth daily before breakfast. 30 tablet 4  . acetaminophen (TYLENOL) 500 MG tablet Take 500 mg by mouth every 6 (six) hours as needed.    . Ascorbic Acid (VITAMIN C PO) Take 1 tablet by mouth daily.    . cetirizine (ZYRTEC) 10 MG chewable tablet Chew 10 mg by mouth daily.    . fluticasone (FLONASE) 50 MCG/ACT nasal spray Place 2 sprays into both nostrils daily. (Patient not taking: Reported on 08/01/2017) 16 g 6  . Multiple Vitamin (MULTIVITAMIN WITH MINERALS) TABS tablet Take 1 tablet by mouth daily.    . pantoprazole (PROTONIX) 40 MG tablet Take 1 tablet (40 mg total) by mouth daily. (Patient not taking: Reported on 03/24/2017) 90 tablet 1  . triamcinolone ointment (KENALOG) 0.5 % Apply 1 application topically 2 (two) times daily. (Patient not taking: Reported on 03/15/2017) 30 g 0  . escitalopram (LEXAPRO) 20 MG tablet Take 1 tablet (20 mg total) by mouth at bedtime. 90 tablet 1  . mupirocin ointment (BACTROBAN) 2 % Apply bid X 1 week to AA (Patient not taking: Reported on 08/01/2017) 22 g 0  . ondansetron (ZOFRAN) 4 MG tablet Take 1 tablet (4 mg total) every 8 (eight) hours as needed by mouth for nausea or vomiting. (Patient not taking: Reported on 06/12/2017) 20 tablet 0   Facility-Administered Medications Prior to Visit  Medication Dose Route Frequency Provider Last Rate Last Dose  . 0.9 %  sodium chloride infusion  500 mL Intravenous Once Milus Banister, MD        Allergies  Allergen Reactions  . Penicillins Anaphylaxis and Other (See Comments)    Has patient had a PCN reaction causing immediate rash,  facial/tongue/throat swelling, SOB or lightheadedness with hypotension: yes Has patient had a PCN reaction causing severe rash involving mucus membranes or skin necrosis: no Has patient had a PCN reaction that required hospitalization: yes Has patient had a PCN reaction occurring within the last 10 years: no If all of the above answers are "NO", then may proceed with Cephalosporin use.        Objective:    BP 103/69 (BP Location: Right Arm, Patient Position: Sitting, Cuff Size: Large)   Pulse 65   Temp 99.2 F (37.3 C) (Oral)   Ht '5\' 7"'  (1.702 m)   Wt 192 lb (87.1 kg)   SpO2 94%   BMI 30.07 kg/m  Wt Readings from Last 3 Encounters:  08/01/17 192 lb (87.1 kg)  06/12/17 196 lb 6.4 oz (89.1 kg)  05/31/17 197 lb (89.4 kg)    Physical Exam  Constitutional: She is oriented to person, place, and time. She appears well-developed and  well-nourished. She is cooperative.  HENT:  Head: Normocephalic and atraumatic.  Eyes: EOM are normal.  Neck: Normal range of motion.  Cardiovascular: Normal rate, regular rhythm and normal heart sounds. Exam reveals no gallop and no friction rub.  No murmur heard. Pulmonary/Chest: Effort normal and breath sounds normal. No tachypnea. No respiratory distress. She has no decreased breath sounds. She has no wheezes. She has no rhonchi. She has no rales. She exhibits no tenderness.  Abdominal: Bowel sounds are normal.  Musculoskeletal: Normal range of motion. She exhibits no edema.  Neurological: She is alert and oriented to person, place, and time. Coordination normal.  Skin: Skin is warm and dry.  Psychiatric: She has a normal mood and affect. Her behavior is normal. Judgment and thought content normal.  Nursing note and vitals reviewed.     Patient has been counseled extensively about nutrition and exercise as well as the importance of adherence with medications and regular follow-up. The patient was given clear instructions to go to ER or return to  medical center if symptoms don't improve, worsen or new problems develop. The patient verbalized understanding.   Follow-up: Return in about 3 months (around 11/01/2017) for hypothyroidism .   Gildardo Pounds, FNP-BC [facility] and [place], [place]   08/01/2017, 1:01 PM

## 2017-08-02 LAB — CMP14+EGFR
ALBUMIN: 4.2 g/dL (ref 3.6–4.8)
ALK PHOS: 57 IU/L (ref 39–117)
ALT: 18 IU/L (ref 0–32)
AST: 18 IU/L (ref 0–40)
Albumin/Globulin Ratio: 2.1 (ref 1.2–2.2)
BUN / CREAT RATIO: 15 (ref 12–28)
BUN: 10 mg/dL (ref 8–27)
Bilirubin Total: 0.5 mg/dL (ref 0.0–1.2)
CO2: 25 mmol/L (ref 20–29)
CREATININE: 0.66 mg/dL (ref 0.57–1.00)
Calcium: 9.4 mg/dL (ref 8.7–10.3)
Chloride: 105 mmol/L (ref 96–106)
GFR, EST AFRICAN AMERICAN: 111 mL/min/{1.73_m2} (ref >59)
GFR, EST NON AFRICAN AMERICAN: 96 mL/min/{1.73_m2} (ref >59)
GLOBULIN, TOTAL: 2 g/dL (ref 1.5–4.5)
Glucose: 97 mg/dL (ref 65–99)
Potassium: 4.4 mmol/L (ref 3.5–5.2)
Sodium: 141 mmol/L (ref 134–144)
Total Protein: 6.2 g/dL (ref 6.0–8.5)

## 2017-08-02 LAB — TSH: TSH: 1.26 u[IU]/mL (ref 0.450–4.500)

## 2017-09-15 ENCOUNTER — Ambulatory Visit: Payer: Self-pay | Attending: Nurse Practitioner | Admitting: Nurse Practitioner

## 2017-09-15 ENCOUNTER — Encounter: Payer: Self-pay | Admitting: Nurse Practitioner

## 2017-09-15 VITALS — BP 116/77 | HR 70 | Temp 99.1°F | Ht 67.0 in | Wt 196.6 lb

## 2017-09-15 DIAGNOSIS — F32A Depression, unspecified: Secondary | ICD-10-CM

## 2017-09-15 DIAGNOSIS — Z79899 Other long term (current) drug therapy: Secondary | ICD-10-CM | POA: Insufficient documentation

## 2017-09-15 DIAGNOSIS — R0989 Other specified symptoms and signs involving the circulatory and respiratory systems: Secondary | ICD-10-CM

## 2017-09-15 DIAGNOSIS — Z880 Allergy status to penicillin: Secondary | ICD-10-CM | POA: Insufficient documentation

## 2017-09-15 DIAGNOSIS — F419 Anxiety disorder, unspecified: Secondary | ICD-10-CM | POA: Insufficient documentation

## 2017-09-15 DIAGNOSIS — K219 Gastro-esophageal reflux disease without esophagitis: Secondary | ICD-10-CM | POA: Insufficient documentation

## 2017-09-15 DIAGNOSIS — R51 Headache: Secondary | ICD-10-CM | POA: Insufficient documentation

## 2017-09-15 DIAGNOSIS — F329 Major depressive disorder, single episode, unspecified: Secondary | ICD-10-CM | POA: Insufficient documentation

## 2017-09-15 DIAGNOSIS — E039 Hypothyroidism, unspecified: Secondary | ICD-10-CM | POA: Insufficient documentation

## 2017-09-15 MED ORDER — LEVOTHYROXINE SODIUM 150 MCG PO TABS: 150.0000 ug | ORAL_TABLET | Freq: Every day | ORAL | 0 refills | Status: DC

## 2017-09-15 MED ORDER — FLUTICASONE PROPIONATE 50 MCG/ACT NA SUSP: 2.0000 | Freq: Every day | NASAL | 6 refills | Status: DC

## 2017-09-15 MED ORDER — ESCITALOPRAM OXALATE 20 MG PO TABS: 20.0000 mg | ORAL_TABLET | Freq: Every day | ORAL | 1 refills | Status: DC

## 2017-09-15 MED FILL — FLUTICASONE PROP 50 MCG SPR: 50 | 30 days supply | Qty: 16 | Fill #0

## 2017-09-15 MED FILL — LEVOTHYROXINE 150 MCG TAB: 150 | 30 days supply | Qty: 30 | Fill #0

## 2017-09-15 MED FILL — ESCITALOPRAM 20 MG TABLET: 20 | 30 days supply | Qty: 30 | Fill #0

## 2017-09-15 NOTE — Progress Notes (Signed)
Assessment & Plan:  Andrea Blackwell was seen today for headache.  Diagnoses and all orders for this visit:  Upper respiratory symptom -     fluticasone (FLONASE) 50 MCG/ACT nasal spray; Place 2 sprays into both nostrils daily. -     Influenza A and B, RT PCR -     CBC  Anxiety and depression -     escitalopram (LEXAPRO) 20 MG tablet; Take 1 tablet (20 mg total) by mouth at bedtime. Do not skip medication or abruptly stop.  Hypothyroidism, unspecified type -     levothyroxine (LEVOXYL) 150 MCG tablet; Take 1 tablet (150 mcg total) by mouth daily before breakfast.    Patient has been counseled on age-appropriate routine health concerns for screening and prevention. These are reviewed and up-to-date. Referrals have been placed accordingly. Immunizations are up-to-date or declined.    Subjective:   Chief Complaint  Patient presents with  . Headache    Pt. stated she has been feeling horrible, with headaches, her head feel stuffy, cloudy, dizzy, and nauseous.    HPI Andrea Blackwell 62 y.o. female presents to office today with generalized symptoms of headache, congestion, dizziness and nausea.    Upper Respiratory Ilness Patient complains of symptoms of a URI. Symptoms include headaches, fatigue, congestion, dizziness, fever and chills. Onset of symptoms was several days ago, gradually worsening since that time. She also c/o achiness and coryza for the past several days .  She is very dehydrated. Evaluation to date: none. Treatment to date: none.   Hypothyroidism She tells me today she was diagnosed with hypothyroidism years ago and has been taking her husband's levoxyl for some years now. I have requested she release her past medical records so that I may verify that she has a true history of hypothyroidism. I was not aware she had any history of hypothyroidism until we spoke today. She states "Yeaj I just hadn't mentioned it to you". She denies any symptoms of hypo or hyperthyroidism.    Review  of Systems  Constitutional: Positive for chills, fever and malaise/fatigue.  HENT: Positive for congestion and sinus pain. Negative for ear discharge, ear pain, hearing loss and sore throat.   Eyes: Negative.   Respiratory: Positive for cough. Negative for sputum production, shortness of breath and wheezing.   Cardiovascular: Negative.  Negative for chest pain, orthopnea and leg swelling.  Gastrointestinal: Positive for nausea. Negative for abdominal pain, diarrhea and vomiting.  Neurological: Positive for headaches. Negative for dizziness and focal weakness.  Endo/Heme/Allergies: Negative for environmental allergies.  Psychiatric/Behavioral: Positive for depression. The patient is nervous/anxious.     Past Medical History:  Diagnosis Date  . Allergy   . Anxiety   . Cataract   . Depression   . GERD (gastroesophageal reflux disease)   . Hypothyroidism   . Pancreatitis     Past Surgical History:  Procedure Laterality Date  . CHOLECYSTECTOMY    . ENDOSCOPIC RETROGRADE CHOLANGIOPANCREATOGRAPHY (ERCP) WITH PROPOFOL N/A 02/02/2017   Procedure: ENDOSCOPIC RETROGRADE CHOLANGIOPANCREATOGRAPHY (ERCP) WITH PROPOFOL;  Surgeon: Milus Banister, MD;  Location: [facility];  Service: Endoscopy;  Laterality: N/A;  . ERCP N/A 12/10/2016   Procedure: ENDOSCOPIC RETROGRADE CHOLANGIOPANCREATOGRAPHY (ERCP);  Surgeon: Carol Ada, MD;  Location: [facility];  Service: Endoscopy;  Laterality: N/A;  . ERCP N/A 12/14/2016   Procedure: ENDOSCOPIC RETROGRADE CHOLANGIOPANCREATOGRAPHY (ERCP);  Surgeon: Milus Banister, MD;  Location: [facility];  Service: Endoscopy;  Laterality: N/A;  . LAPAROSCOPIC CHOLECYSTECTOMY SINGLE SITE WITH INTRAOPERATIVE CHOLANGIOGRAM N/A  12/13/2016   Procedure: LAPAROSCOPIC CHOLECYSTECTOMY SINGLE SITE AND LIVER BIOPSY;  Surgeon: Michael Boston, MD;  Location: [facility];  Service: General;  Laterality: N/A;  . OVARY SURGERY      Family History  Problem Relation Age of Onset  .  Diabetes Maternal Grandfather   . Colon cancer Neg Hx   . Esophageal cancer Neg Hx   . Liver cancer Neg Hx   . Pancreatic cancer Neg Hx   . Rectal cancer Neg Hx   . Stomach cancer Neg Hx   . Breast cancer Neg Hx   . Prostate cancer Neg Hx     Social History Reviewed with no changes to be made today.   Outpatient Medications Prior to Visit  Medication Sig Dispense Refill  . acetaminophen (TYLENOL) 500 MG tablet Take 500 mg by mouth every 6 (six) hours as needed.    . Ascorbic Acid (VITAMIN C PO) Take 1 tablet by mouth daily.    . cetirizine (ZYRTEC) 10 MG chewable tablet Chew 10 mg by mouth daily.    . Multiple Vitamin (MULTIVITAMIN WITH MINERALS) TABS tablet Take 1 tablet by mouth daily.    . Probiotic CAPS Take 1 capsule by mouth daily.    Marland Kitchen escitalopram (LEXAPRO) 20 MG tablet Take 1 tablet (20 mg total) by mouth at bedtime. 90 tablet 1  . busPIRone (BUSPAR) 15 MG tablet Take 1 tablet (15 mg total) by mouth 3 (three) times daily. (Patient not taking: Reported on 09/15/2017) 30 tablet 1  . pantoprazole (PROTONIX) 40 MG tablet Take 1 tablet (40 mg total) by mouth daily. (Patient not taking: Reported on 03/24/2017) 90 tablet 1  . triamcinolone ointment (KENALOG) 0.5 % Apply 1 application topically 2 (two) times daily. (Patient not taking: Reported on 03/15/2017) 30 g 0  . fluticasone (FLONASE) 50 MCG/ACT nasal spray Place 2 sprays into both nostrils daily. (Patient not taking: Reported on 08/01/2017) 16 g 6   Facility-Administered Medications Prior to Visit  Medication Dose Route Frequency Provider Last Rate Last Dose  . 0.9 %  sodium chloride infusion  500 mL Intravenous Once Milus Banister, MD        Allergies  Allergen Reactions  . Penicillins Anaphylaxis and Other (See Comments)    Has patient had a PCN reaction causing immediate rash, facial/tongue/throat swelling, SOB or lightheadedness with hypotension: yes Has patient had a PCN reaction causing severe rash involving mucus  membranes or skin necrosis: no Has patient had a PCN reaction that required hospitalization: yes Has patient had a PCN reaction occurring within the last 10 years: no If all of the above answers are "NO", then may proceed with Cephalosporin use.        Objective:    BP 116/77 (BP Location: Right Arm, Patient Position: Sitting, Cuff Size: Large)   Pulse 70   Temp 99.1 F (37.3 C) (Oral)   Ht 5\' 7"  (1.702 m)   Wt 196 lb 9.6 oz (89.2 kg)   SpO2 97%   BMI 30.79 kg/m  Wt Readings from Last 3 Encounters:  09/15/17 196 lb 9.6 oz (89.2 kg)  08/01/17 192 lb (87.1 kg)  06/12/17 196 lb 6.4 oz (89.1 kg)    Physical Exam  Constitutional: She is oriented to person, place, and time. She appears well-developed and well-nourished. She has a sickly appearance. She does not appear ill.  HENT:  Head: Normocephalic.  Right Ear: Hearing, external ear and ear canal normal. A middle ear effusion is present.  Left Ear: Hearing, external ear and ear canal normal. A middle ear effusion is present.  Nose: Mucosal edema and rhinorrhea present. Right sinus exhibits maxillary sinus tenderness. Right sinus exhibits no frontal sinus tenderness. Left sinus exhibits maxillary sinus tenderness. Left sinus exhibits no frontal sinus tenderness.  Mouth/Throat: Mucous membranes are normal. No oropharyngeal exudate, posterior oropharyngeal edema, posterior oropharyngeal erythema or tonsillar abscesses.  Eyes: EOM are normal.  Neck: Normal range of motion. No thyromegaly present.  Cardiovascular: Normal rate and regular rhythm. Exam reveals no gallop and no friction rub.  No murmur heard. Pulmonary/Chest: Effort normal and breath sounds normal. No respiratory distress. She has no wheezes. She has no rales. She exhibits no tenderness.  Abdominal: Soft. Bowel sounds are normal.  Musculoskeletal: Normal range of motion.  Lymphadenopathy:    She has no cervical adenopathy.  Neurological: She is alert and oriented to  person, place, and time.  Skin: Skin is warm and dry.  Psychiatric: She has a normal mood and affect. Her behavior is normal. Judgment normal. She expresses no homicidal and no suicidal ideation. She expresses no suicidal plans and no homicidal plans.       Patient has been counseled extensively about nutrition and exercise as well as the importance of adherence with medications and regular follow-up. The patient was given clear instructions to go to ER or return to medical center if symptoms don't improve, worsen or new problems develop. The patient verbalized understanding.   Follow-up: Return in about 3 months (around 12/16/2017).   Gildardo Pounds, FNP-BC [facility] and [place], [place]   09/15/2017, 1:16 PM

## 2017-09-15 NOTE — Patient Instructions (Signed)
INSTRUCTIONS: use a humidifier for nasal congestion Drink plenty of fluids, rest and wash hands frequently to avoid the spread of infection Alternate tylenol and Motrin for relief of fever   Upper Respiratory Infection, Adult Most upper respiratory infections (URIs) are a viral infection of the air passages leading to the lungs. A URI affects the nose, throat, and upper air passages. The most common type of URI is nasopharyngitis and is typically referred to as "the common cold." URIs run their course and usually go away on their own. Most of the time, a URI does not require medical attention, but sometimes a bacterial infection in the upper airways can follow a viral infection. This is called a secondary infection. Sinus and middle ear infections are common types of secondary upper respiratory infections. Bacterial pneumonia can also complicate a URI. A URI can worsen asthma and chronic obstructive pulmonary disease (COPD). Sometimes, these complications can require emergency medical care and may be life threatening. What are the causes? Almost all URIs are caused by viruses. A virus is a type of germ and can spread from one person to another. What increases the risk? You may be at risk for a URI if:  You smoke.  You have chronic heart or lung disease.  You have a weakened defense (immune) system.  You are very young or very old.  You have nasal allergies or asthma.  You work in crowded or poorly ventilated areas.  You work in health care facilities or schools.  What are the signs or symptoms? Symptoms typically develop 2-3 days after you come in contact with a cold virus. Most viral URIs last 7-10 days. However, viral URIs from the influenza virus (flu virus) can last 14-18 days and are typically more severe. Symptoms may include:  Runny or stuffy (congested) nose.  Sneezing.  Cough.  Sore throat.  Headache.  Fatigue.  Fever.  Loss of appetite.  Pain in your forehead,  behind your eyes, and over your cheekbones (sinus pain).  Muscle aches.  How is this diagnosed? Your health care provider may diagnose a URI by:  Physical exam.  Tests to check that your symptoms are not due to another condition such as: ? Strep throat. ? Sinusitis. ? Pneumonia. ? Asthma.  How is this treated? A URI goes away on its own with time. It cannot be cured with medicines, but medicines may be prescribed or recommended to relieve symptoms. Medicines may help:  Reduce your fever.  Reduce your cough.  Relieve nasal congestion.  Follow these instructions at home:  Take medicines only as directed by your health care provider.  Gargle warm saltwater or take cough drops to comfort your throat as directed by your health care provider.  Use a warm mist humidifier or inhale steam from a shower to increase air moisture. This may make it easier to breathe.  Drink enough fluid to keep your urine clear or pale yellow.  Eat soups and other clear broths and maintain good nutrition.  Rest as needed.  Return to work when your temperature has returned to normal or as your health care provider advises. You may need to stay home longer to avoid infecting others. You can also use a face mask and careful hand washing to prevent spread of the virus.  Increase the usage of your inhaler if you have asthma.  Do not use any tobacco products, including cigarettes, chewing tobacco, or electronic cigarettes. If you need help quitting, ask your health care provider. How  is this prevented? The best way to protect yourself from getting a cold is to practice good hygiene.  Avoid oral or hand contact with people with cold symptoms.  Wash your hands often if contact occurs.  There is no clear evidence that vitamin C, vitamin E, echinacea, or exercise reduces the chance of developing a cold. However, it is always recommended to get plenty of rest, exercise, and practice good nutrition. Contact  a health care provider if:  You are getting worse rather than better.  Your symptoms are not controlled by medicine.  You have chills.  You have worsening shortness of breath.  You have brown or red mucus.  You have yellow or brown nasal discharge.  You have pain in your face, especially when you bend forward.  You have a fever.  You have swollen neck glands.  You have pain while swallowing.  You have white areas in the back of your throat. Get help right away if:  You have severe or persistent: ? Headache. ? Ear pain. ? Sinus pain. ? Chest pain.  You have chronic lung disease and any of the following: ? Wheezing. ? Prolonged cough. ? Coughing up blood. ? A change in your usual mucus.  You have a stiff neck.  You have changes in your: ? Vision. ? Hearing. ? Thinking. ? Mood. This information is not intended to replace advice given to you by your health care provider. Make sure you discuss any questions you have with your health care provider. Document Released: 07/20/2000 Document Revised: 09/27/2015 Document Reviewed: 05/01/2013 Elsevier Interactive Patient Education  [employer].

## 2017-09-19 LAB — CBC
Hematocrit: 42.9 % (ref 34.0–46.6)
Hemoglobin: 14.5 g/dL (ref 11.1–15.9)
MCH: 30.1 pg (ref 26.6–33.0)
MCHC: 33.8 g/dL (ref 31.5–35.7)
MCV: 89 fL (ref 79–97)
PLATELETS: 221 10*3/uL (ref 150–450)
RBC: 4.82 x10E6/uL (ref 3.77–5.28)
RDW: 12.4 % (ref 12.3–15.4)
WBC: 3.7 10*3/uL (ref 3.4–10.8)

## 2017-09-19 LAB — INFLUENZA A AND B, RT PCR
INFLUENZA A PCR: NEGATIVE
INFLUENZA B PCR: NEGATIVE

## 2017-11-01 ENCOUNTER — Ambulatory Visit: Payer: Self-pay | Admitting: Nurse Practitioner

## 2017-11-01 ENCOUNTER — Telehealth: Payer: Self-pay

## 2017-11-01 NOTE — Telephone Encounter (Signed)
As per Andrea Blackwell, [employer] [place], the medicaid application was denied and they would like to appeal but have not had a response back from the patient about an appeal despite sending her a 10 day letter.

## 2017-11-08 MED FILL — LEVOTHYROXINE 150 MCG TAB: 150 | 30 days supply | Qty: 30 | Fill #1

## 2017-11-08 MED FILL — ESCITALOPRAM 20 MG TABLET: 20 | 30 days supply | Qty: 30 | Fill #1

## 2017-12-20 ENCOUNTER — Encounter: Payer: Self-pay | Admitting: Nurse Practitioner

## 2017-12-20 ENCOUNTER — Ambulatory Visit: Payer: Self-pay | Attending: Nurse Practitioner | Admitting: Nurse Practitioner

## 2017-12-20 VITALS — BP 112/68 | HR 73 | Temp 99.0°F | Ht 67.0 in | Wt 199.0 lb

## 2017-12-20 DIAGNOSIS — Z79899 Other long term (current) drug therapy: Secondary | ICD-10-CM | POA: Insufficient documentation

## 2017-12-20 DIAGNOSIS — E039 Hypothyroidism, unspecified: Secondary | ICD-10-CM | POA: Insufficient documentation

## 2017-12-20 DIAGNOSIS — F329 Major depressive disorder, single episode, unspecified: Secondary | ICD-10-CM | POA: Insufficient documentation

## 2017-12-20 DIAGNOSIS — Z880 Allergy status to penicillin: Secondary | ICD-10-CM | POA: Insufficient documentation

## 2017-12-20 DIAGNOSIS — F419 Anxiety disorder, unspecified: Secondary | ICD-10-CM | POA: Insufficient documentation

## 2017-12-20 DIAGNOSIS — K219 Gastro-esophageal reflux disease without esophagitis: Secondary | ICD-10-CM | POA: Insufficient documentation

## 2017-12-20 DIAGNOSIS — E782 Mixed hyperlipidemia: Secondary | ICD-10-CM | POA: Insufficient documentation

## 2017-12-20 MED FILL — LEVOTHYROXINE 75 MCG TABLET: 75 | 30 days supply | Qty: 30 | Fill #0

## 2017-12-20 MED FILL — ESCITALOPRAM 20 MG TABLET: 20 | 30 days supply | Qty: 30 | Fill #2

## 2017-12-20 MED FILL — FLUTICASONE PROP 50 MCG SPR: 50 | 30 days supply | Qty: 16 | Fill #1

## 2017-12-20 NOTE — Progress Notes (Signed)
Assessment & Plan:  Andrea Blackwell was seen today for follow-up.  Diagnoses and all orders for this visit:  Hypothyroidism, unspecified type -     TSH Continue current prescription as prescribed  Mixed hyperlipidemia -     Lipid panel Not well controlled.   Patient has been counseled on age-appropriate routine health concerns for screening and prevention. These are reviewed and up-to-date. Referrals have been placed accordingly. Immunizations are up-to-date or declined.    Subjective:   Chief Complaint  Patient presents with  . Follow-up    Pt. is follow-up on thyroid.    HPI Andrea Blackwell 62 y.o. female presents to office today for follow up to hypothyroidism and HPL.   Hypothyroidism Patient presents for evaluation of thyroid function. She denies weight changes, heat/cold intolerance, bowel/skin changes or CVS symptoms. She does endorse fatigue. The problem has been stable.  Previous thyroid studies include TSH, triiodothyronine free and total T4. The hypothyroidism is due to hypothyroidism. She is currently taking levothyroxine 121mcg daily as prescribed.   Hyperlipidemia Patient presents for follow up to hyperlipidemia.  She currently does not take any lipid lowering agents.  States she took omega 3 in the past but stopped taking it over a year ago. She is not diet compliant and denies exertional chest pressure/discomfort, poor exercise tolerance and skin xanthelasma or any previous statin intolerance including myalgias. Lipid panel pending.  Lab Results  Component Value Date   CHOL 279 (H) 12/10/2016   Lab Results  Component Value Date   HDL 21 (L) 12/10/2016   Lab Results  Component Value Date   LDLCALC 216 (H) 12/10/2016   Lab Results  Component Value Date   TRIG 209 (H) 12/10/2016   Lab Results  Component Value Date   CHOLHDL 13.3 12/10/2016    Review of Systems  Constitutional: Positive for malaise/fatigue. Negative for fever and weight loss.  HENT: Negative.   Negative for nosebleeds.   Eyes: Negative.  Negative for blurred vision, double vision and photophobia.  Respiratory: Negative.  Negative for cough and shortness of breath.   Cardiovascular: Negative.  Negative for chest pain, palpitations and leg swelling.  Gastrointestinal: Negative.  Negative for heartburn, nausea and vomiting.  Musculoskeletal: Negative.  Negative for myalgias.  Neurological: Negative.  Negative for dizziness, focal weakness, seizures and headaches.  Psychiatric/Behavioral: Negative.  Negative for suicidal ideas.    Past Medical History:  Diagnosis Date  . Allergy   . Anxiety   . Cataract   . Depression   . GERD (gastroesophageal reflux disease)   . Hypothyroidism   . Pancreatitis     Past Surgical History:  Procedure Laterality Date  . CHOLECYSTECTOMY    . ENDOSCOPIC RETROGRADE CHOLANGIOPANCREATOGRAPHY (ERCP) WITH PROPOFOL N/A 02/02/2017   Procedure: ENDOSCOPIC RETROGRADE CHOLANGIOPANCREATOGRAPHY (ERCP) WITH PROPOFOL;  Surgeon: Milus Banister, MD;  Location: [facility];  Service: Endoscopy;  Laterality: N/A;  . ERCP N/A 12/10/2016   Procedure: ENDOSCOPIC RETROGRADE CHOLANGIOPANCREATOGRAPHY (ERCP);  Surgeon: Carol Ada, MD;  Location: [facility];  Service: Endoscopy;  Laterality: N/A;  . ERCP N/A 12/14/2016   Procedure: ENDOSCOPIC RETROGRADE CHOLANGIOPANCREATOGRAPHY (ERCP);  Surgeon: Milus Banister, MD;  Location: [facility];  Service: Endoscopy;  Laterality: N/A;  . LAPAROSCOPIC CHOLECYSTECTOMY SINGLE SITE WITH INTRAOPERATIVE CHOLANGIOGRAM N/A 12/13/2016   Procedure: LAPAROSCOPIC CHOLECYSTECTOMY SINGLE SITE AND LIVER BIOPSY;  Surgeon: Michael Boston, MD;  Location: [facility];  Service: General;  Laterality: N/A;  . OVARY SURGERY      Family History  Problem  Relation Age of Onset  . Diabetes Maternal Grandfather   . Colon cancer Neg Hx   . Esophageal cancer Neg Hx   . Liver cancer Neg Hx   . Pancreatic cancer Neg Hx   . Rectal cancer Neg Hx   .  Stomach cancer Neg Hx   . Breast cancer Neg Hx   . Prostate cancer Neg Hx     Social History Reviewed with no changes to be made today.   Outpatient Medications Prior to Visit  Medication Sig Dispense Refill  . fluticasone (FLONASE) 50 MCG/ACT nasal spray Place 2 sprays into both nostrils daily. 16 g 6  . levothyroxine (LEVOXYL) 150 MCG tablet Take 1 tablet (150 mcg total) by mouth daily before breakfast. 90 tablet 0  . acetaminophen (TYLENOL) 500 MG tablet Take 500 mg by mouth every 6 (six) hours as needed.    . Ascorbic Acid (VITAMIN C PO) Take 1 tablet by mouth daily.    . busPIRone (BUSPAR) 15 MG tablet Take 1 tablet (15 mg total) by mouth 3 (three) times daily. (Patient not taking: Reported on 09/15/2017) 30 tablet 1  . cetirizine (ZYRTEC) 10 MG chewable tablet Chew 10 mg by mouth daily.    Marland Kitchen escitalopram (LEXAPRO) 20 MG tablet Take 1 tablet (20 mg total) by mouth at bedtime. 90 tablet 1  . Multiple Vitamin (MULTIVITAMIN WITH MINERALS) TABS tablet Take 1 tablet by mouth daily.    . pantoprazole (PROTONIX) 40 MG tablet Take 1 tablet (40 mg total) by mouth daily. (Patient not taking: Reported on 03/24/2017) 90 tablet 1  . Probiotic CAPS Take 1 capsule by mouth daily.    Marland Kitchen triamcinolone ointment (KENALOG) 0.5 % Apply 1 application topically 2 (two) times daily. (Patient not taking: Reported on 03/15/2017) 30 g 0   Facility-Administered Medications Prior to Visit  Medication Dose Route Frequency Provider Last Rate Last Dose  . 0.9 %  sodium chloride infusion  500 mL Intravenous Once Milus Banister, MD        Allergies  Allergen Reactions  . Penicillins Anaphylaxis and Other (See Comments)    Has patient had a PCN reaction causing immediate rash, facial/tongue/throat swelling, SOB or lightheadedness with hypotension: yes Has patient had a PCN reaction causing severe rash involving mucus membranes or skin necrosis: no Has patient had a PCN reaction that required hospitalization: yes Has  patient had a PCN reaction occurring within the last 10 years: no If all of the above answers are "NO", then may proceed with Cephalosporin use.        Objective:    BP 112/68 (BP Location: Right Arm, Patient Position: Sitting, Cuff Size: Normal)   Pulse 73   Temp 99 F (37.2 C) (Oral)   Ht 5\' 7"  (1.702 m)   Wt 199 lb (90.3 kg)   SpO2 95%   BMI 31.17 kg/m  Wt Readings from Last 3 Encounters:  12/20/17 199 lb (90.3 kg)  09/15/17 196 lb 9.6 oz (89.2 kg)  08/01/17 192 lb (87.1 kg)    Physical Exam  Constitutional: She is oriented to person, place, and time. She appears well-developed and well-nourished. She is cooperative.  HENT:  Head: Normocephalic and atraumatic.  Eyes: EOM are normal.  Neck: Normal range of motion.  Cardiovascular: Normal rate, regular rhythm and normal heart sounds. Exam reveals no gallop and no friction rub.  No murmur heard. Pulmonary/Chest: Effort normal and breath sounds normal. No tachypnea. No respiratory distress. She has no decreased breath  sounds. She has no wheezes. She has no rhonchi. She has no rales. She exhibits no tenderness.  Abdominal: Bowel sounds are normal.  Musculoskeletal: Normal range of motion. She exhibits no edema.  Neurological: She is alert and oriented to person, place, and time. Coordination normal.  Skin: Skin is warm and dry.  Psychiatric: She has a normal mood and affect. Her behavior is normal. Judgment and thought content normal.  Nursing note and vitals reviewed.      Patient has been counseled extensively about nutrition and exercise as well as the importance of adherence with medications and regular follow-up. The patient was given clear instructions to go to ER or return to medical center if symptoms don't improve, worsen or new problems develop. The patient verbalized understanding.   Follow-up: Return for Lab appointment after 01-31-2018 for TSH. Also see me in 3 months .   Gildardo Pounds, FNP-BC [facility] and [place], [place]   12/20/2017, 8:15 PM

## 2017-12-21 LAB — TSH: TSH: 2.4 u[IU]/mL (ref 0.450–4.500)

## 2017-12-21 LAB — LIPID PANEL
CHOLESTEROL TOTAL: 250 mg/dL — AB (ref 100–199)
Chol/HDL Ratio: 2.7 ratio (ref 0.0–4.4)
HDL: 93 mg/dL (ref >39)
LDL CALC: 139 mg/dL — AB (ref 0–99)
Triglycerides: 91 mg/dL (ref 0–149)
VLDL CHOLESTEROL CAL: 18 mg/dL (ref 5–40)

## 2017-12-27 ENCOUNTER — Telehealth: Payer: Self-pay

## 2017-12-27 NOTE — Telephone Encounter (Signed)
As per Abelino Derrick, [employer] [place], the case is now closed

## 2018-01-23 MED FILL — LEVOTHYROXINE 150 MCG TAB: 150 | 30 days supply | Qty: 30 | Fill #2

## 2018-01-23 MED FILL — DOXYCYCLINE HYCLATE 100 MG: 100 | 10 days supply | Qty: 20 | Fill #0

## 2018-02-21 ENCOUNTER — Ambulatory Visit: Payer: Self-pay | Attending: Nurse Practitioner | Admitting: Physician Assistant

## 2018-02-21 VITALS — BP 122/76 | HR 68 | Temp 98.4°F | Resp 16 | Ht 67.0 in | Wt 202.0 lb

## 2018-02-21 DIAGNOSIS — M79602 Pain in left arm: Secondary | ICD-10-CM

## 2018-02-21 DIAGNOSIS — S161XXA Strain of muscle, fascia and tendon at neck level, initial encounter: Secondary | ICD-10-CM

## 2018-02-21 DIAGNOSIS — S29019A Strain of muscle and tendon of unspecified wall of thorax, initial encounter: Secondary | ICD-10-CM

## 2018-02-21 DIAGNOSIS — R079 Chest pain, unspecified: Secondary | ICD-10-CM

## 2018-02-21 DIAGNOSIS — R29898 Other symptoms and signs involving the musculoskeletal system: Secondary | ICD-10-CM

## 2018-02-21 MED ORDER — KETOROLAC TROMETHAMINE 60 MG/2ML IM SOLN
60.0000 mg | Freq: Once | INTRAMUSCULAR | Status: AC
  Administered 2018-02-21: 60 mg via INTRAMUSCULAR

## 2018-02-21 MED ORDER — NAPROXEN 500 MG PO TABS: 500.0000 mg | ORAL_TABLET | Freq: Two times a day (BID) | ORAL | 0 refills | Status: DC

## 2018-02-21 MED ORDER — KETOROLAC TROMETHAMINE 60 MG/2ML IM SOLN: 60.0000 mg | Freq: Once | INTRAMUSCULAR | Status: DC

## 2018-02-21 MED ORDER — METHOCARBAMOL 500 MG PO TABS: 1000.0000 mg | ORAL_TABLET | Freq: Three times a day (TID) | ORAL | 1 refills | Status: DC

## 2018-02-21 MED FILL — NAPROXEN 500 MG TABLET: 500 | 10 days supply | Qty: 20 | Fill #0

## 2018-02-21 MED FILL — METHOCARBAMOL 500 MG TABS: 500 | 15 days supply | Qty: 90 | Fill #0

## 2018-02-21 MED FILL — FLUTICASONE PROP 50 MCG SPR: 50 | 30 days supply | Qty: 16 | Fill #2

## 2018-02-21 MED FILL — LEVOTHYROXINE 75 MCG TABLET: 75 | 30 days supply | Qty: 30 | Fill #1

## 2018-02-21 MED FILL — ESCITALOPRAM 20 MG TABLET: 20 | 30 days supply | Qty: 30 | Fill #3

## 2018-02-21 NOTE — Progress Notes (Signed)
Patient ID: Andrea Blackwell, female   DOB: Jun 27, 1955, 63 y.o.   MRN: [id number]      Andrea Blackwell, is a 63 y.o. female  [id number]  [id number]  DOB - 1955-02-23  Subjective:  Chief Complaint and HPI: Andrea Blackwell is a 63 y.o. female here today for a follow up visit after Slipped and feel on carpet 02/18/2018; fell onto L side.  Felt something "snap" between shoulder blades. She was seen in the ED in [place], [place] and discharged on no meds.  advil not helping.  Pain is worse.  Pain in neck, thoracic back, L arm.  Dropped a few things yesterday with left hand.  There were multiple xrays and maybe even a CT scan done per patient.  I have no access to any records.  Also c/o pleuritic CP with deep breaths.  No head injury.  No LOC.    ED/Hospital notes reviewed.    ROS:   Constitutional:  No f/c, No night sweats, No unexplained weight loss. EENT:  No vision changes, No blurry vision, No hearing changes. No mouth, throat, or ear problems.  Respiratory: No cough, No SOB Cardiac: No CP, no palpitations GI:  No abd pain, No N/V/D. GU: No Urinary s/sx Musculoskeletal: pain as above Neuro: No headache, no dizziness, no motor weakness.  Skin: No rash Endocrine:  No polydipsia. No polyuria.  Psych: Denies SI/HI  No problems updated.  ALLERGIES: Allergies  Allergen Reactions  . Penicillins Anaphylaxis and Other (See Comments)    Has patient had a PCN reaction causing immediate rash, facial/tongue/throat swelling, SOB or lightheadedness with hypotension: yes Has patient had a PCN reaction causing severe rash involving mucus membranes or skin necrosis: no Has patient had a PCN reaction that required hospitalization: yes Has patient had a PCN reaction occurring within the last 10 years: no If all of the above answers are "NO", then may proceed with Cephalosporin use.     PAST MEDICAL HISTORY: Past Medical History:  Diagnosis Date  . Allergy   . Anxiety   . Cataract   . Depression   . GERD  (gastroesophageal reflux disease)   . Hypothyroidism   . Pancreatitis     MEDICATIONS AT HOME: Prior to Admission medications   Medication Sig Start Date End Date Taking? Authorizing Provider  acetaminophen (TYLENOL) 500 MG tablet Take 500 mg by mouth every 6 (six) hours as needed.    [provider]  Ascorbic Acid (VITAMIN C PO) Take 1 tablet by mouth daily.    [provider]  busPIRone (BUSPAR) 15 MG tablet Take 1 tablet (15 mg total) by mouth 3 (three) times daily. Patient not taking: Reported on 09/15/2017 08/01/17   Gildardo Pounds, NP  cetirizine (ZYRTEC) 10 MG chewable tablet Chew 10 mg by mouth daily.    [provider]  escitalopram (LEXAPRO) 20 MG tablet Take 1 tablet (20 mg total) by mouth at bedtime. 09/15/17 12/14/17  Gildardo Pounds, NP  fluticasone (FLONASE) 50 MCG/ACT nasal spray Place 2 sprays into both nostrils daily. 09/15/17   Gildardo Pounds, NP  levothyroxine (LEVOXYL) 150 MCG tablet Take 1 tablet (150 mcg total) by mouth daily before breakfast. 09/15/17   Gildardo Pounds, NP  methocarbamol (ROBAXIN) 500 MG tablet Take 2 tablets (1,000 mg total) by mouth 3 (three) times daily. X 10 days then prn 02/21/18   Argentina Donovan, PA-C  Multiple Vitamin (MULTIVITAMIN WITH MINERALS) TABS tablet Take 1 tablet by mouth daily.  [provider]  naproxen (NAPROSYN) 500 MG tablet Take 1 tablet (500 mg total) by mouth 2 (two) times daily with a meal. X 10 days then prn 02/21/18   Argentina Donovan, PA-C  pantoprazole (PROTONIX) 40 MG tablet Take 1 tablet (40 mg total) by mouth daily. Patient not taking: Reported on 03/24/2017 01/04/17 04/04/17  Gildardo Pounds, NP  Probiotic CAPS Take 1 capsule by mouth daily.    [provider]  triamcinolone ointment (KENALOG) 0.5 % Apply 1 application topically 2 (two) times daily. Patient not taking: Reported on 03/15/2017 03/01/17   Gildardo Pounds, NP     Objective:  EXAM:   Vitals:   02/21/18 1513    BP: 122/76  Pulse: 68  Resp: 16  Temp: 98.4 F (36.9 C)  TempSrc: Oral  SpO2: 99%  Weight: 202 lb (91.6 kg)  Height: 5\' 7"  (1.702 m)    General appearance : A&OX3. NAD. Non-toxic-appearing HEENT: Atraumatic and Normocephalic.  PERRLA. EOM intact.   Neck: supple, no JVD. No cervical lymphadenopathy. No thyromegaly.  No c-spine TTP.  +L trapezius spasm.   Chest/Lungs:  Breathing-non-labored, Good air entry bilaterally, breath sounds normal without rales, rhonchi, or wheezing  CVS: S1 S2 regular, no murmurs, gallops, rubs  L arm with decreased ROM but good strength and grip.  UEDTR=intact B. +TTP L mid chest.   Extremities: Bilateral Lower Ext shows no edema, both legs are warm to touch with = pulse throughout Neurology:  CN II-XII grossly intact, Non focal.   Psych:  TP linear. J/I WNL. Normal speech. Appropriate eye contact and affect.  Skin:  No Rash  Data Review Lab Results  Component Value Date   HGBA1C 5.5 01/04/2017     Assessment & Plan   1. Left arm weakness No red flags - DG Cervical Spine Complete; Future  2. Pain of left upper extremity - methocarbamol (ROBAXIN) 500 MG tablet; Take 2 tablets (1,000 mg total) by mouth 3 (three) times daily. X 10 days then prn  Dispense: 90 tablet; Refill: 1 - naproxen (NAPROSYN) 500 MG tablet; Take 1 tablet (500 mg total) by mouth 2 (two) times daily with a meal. X 10 days then prn  Dispense: 60 tablet; Refill: 0  3. Strain of neck muscle, initial encounter No red flags on exam - DG Cervical Spine Complete; Future - methocarbamol (ROBAXIN) 500 MG tablet; Take 2 tablets (1,000 mg total) by mouth 3 (three) times daily. X 10 days then prn  Dispense: 90 tablet; Refill: 1 - naproxen (NAPROSYN) 500 MG tablet; Take 1 tablet (500 mg total) by mouth 2 (two) times daily with a meal. X 10 days then prn  Dispense: 60 tablet; Refill: 0  4. Strain of thoracic region, initial encounter - DG Thoracic Spine W/Swimmers; Future - methocarbamol  (ROBAXIN) 500 MG tablet; Take 2 tablets (1,000 mg total) by mouth 3 (three) times daily. X 10 days then prn  Dispense: 90 tablet; Refill: 1 - naproxen (NAPROSYN) 500 MG tablet; Take 1 tablet (500 mg total) by mouth 2 (two) times daily with a meal. X 10 days then prn  Dispense: 60 tablet; Refill: 0  5. Left-sided chest pain - DG Ribs Unilateral W/Chest Left; Future - naproxen (NAPROSYN) 500 MG tablet; Take 1 tablet (500 mg total) by mouth 2 (two) times daily with a meal. X 10 days then prn  Dispense: 60 tablet; Refill: 0     Patient have been counseled extensively about nutrition and exercise  Return for 2/14 appt with Zelda.  The patient was given clear instructions to go to ER or return to medical center if symptoms don't improve, worsen or new problems develop. The patient verbalized understanding. The patient was told to call to get lab results if they haven't heard anything in the next week.     Freeman Caldron, PA-C [facility], [place]   02/21/2018, 3:28 PM

## 2018-02-21 NOTE — Patient Instructions (Signed)
Fall Prevention in the Home, Adult  Falls can cause injuries. They can happen to people of all ages. There are many things you can do to make your home safe and to help prevent falls. Ask for help when making these changes, if needed.  What actions can I take to prevent falls?  General Instructions  · Use good lighting in all rooms. Replace any light bulbs that burn out.  · Turn on the lights when you go into a dark area. Use night-lights.  · Keep items that you use often in easy-to-reach places. Lower the shelves around your home if necessary.  · Set up your furniture so you have a clear path. Avoid moving your furniture around.  · Do not have throw rugs and other things on the floor that can make you trip.  · Avoid walking on wet floors.  · If any of your floors are uneven, fix them.  · Add color or contrast paint or tape to clearly mark and help you see:  ? Any grab bars or handrails.  ? First and last steps of stairways.  ? Where the edge of each step is.  · If you use a stepladder:  ? Make sure that it is fully opened. Do not climb a closed stepladder.  ? Make sure that both sides of the stepladder are locked into place.  ? Ask someone to hold the stepladder for you while you use it.  · If there are any pets around you, be aware of where they are.  What can I do in the bathroom?         · Keep the floor dry. Clean up any water that spills onto the floor as soon as it happens.  · Remove soap buildup in the tub or shower regularly.  · Use non-skid mats or decals on the floor of the tub or shower.  · Attach bath mats securely with double-sided, non-slip rug tape.  · If you need to sit down in the shower, use a plastic, non-slip stool.  · Install grab bars by the toilet and in the tub and shower. Do not use towel bars as grab bars.  What can I do in the bedroom?  · Make sure that you have a light by your bed that is easy to reach.  · Do not use any sheets or blankets that are too big for your bed. They should  not hang down onto the floor.  · Have a firm chair that has side arms. You can use this for support while you get dressed.  What can I do in the kitchen?  · Clean up any spills right away.  · If you need to reach something above you, use a strong step stool that has a grab bar.  · Keep electrical cords out of the way.  · Do not use floor polish or wax that makes floors slippery. If you must use wax, use non-skid floor wax.  What can I do with my stairs?  · Do not leave any items on the stairs.  · Make sure that you have a light switch at the top of the stairs and the bottom of the stairs. If you do not have them, ask someone to add them for you.  · Make sure that there are handrails on both sides of the stairs, and use them. Fix handrails that are broken or loose. Make sure that handrails are as long as the stairways.  ·   Install non-slip stair treads on all stairs in your home.  · Avoid having throw rugs at the top or bottom of the stairs. If you do have throw rugs, attach them to the floor with carpet tape.  · Choose a carpet that does not hide the edge of the steps on the stairway.  · Check any carpeting to make sure that it is firmly attached to the stairs. Fix any carpet that is loose or worn.  What can I do on the outside of my home?  · Use bright outdoor lighting.  · Regularly fix the edges of walkways and driveways and fix any cracks.  · Remove anything that might make you trip as you walk through a door, such as a raised step or threshold.  · Trim any bushes or trees on the path to your home.  · Regularly check to see if handrails are loose or broken. Make sure that both sides of any steps have handrails.  · Install guardrails along the edges of any raised decks and porches.  · Clear walking paths of anything that might make someone trip, such as tools or rocks.  · Have any leaves, snow, or ice cleared regularly.  · Use sand or salt on walking paths during winter.  · Clean up any spills in your garage right  away. This includes grease or oil spills.  What other actions can I take?  · Wear shoes that:  ? Have a low heel. Do not wear high heels.  ? Have rubber bottoms.  ? Are comfortable and fit you well.  ? Are closed at the toe. Do not wear open-toe sandals.  · Use tools that help you move around (mobility aids) if they are needed. These include:  ? Canes.  ? Walkers.  ? Scooters.  ? Crutches.  · Review your medicines with your doctor. Some medicines can make you feel dizzy. This can increase your chance of falling.  Ask your doctor what other things you can do to help prevent falls.  Where to find more information  · Centers for Disease Control and Prevention, STEAD[employer]: https://cdc.gov  · National Institute on Aging: https://go4life.nia.nih.gov  Contact a doctor if:  · You are afraid of falling at home.  · You feel weak, drowsy, or dizzy at home.  · You fall at home.  Summary  · There are many simple things that you can do to make your home safe and to help prevent falls.  · Ways to make your home safe include removing tripping hazards and installing grab bars in the bathroom.  · Ask for help when making these changes in your home.  This information is not intended to replace advice given to you by your health care provider. Make sure you discuss any questions you have with your health care provider.  Document Released: 11/20/2008 Document Revised: 09/08/2016 Document Reviewed: 09/08/2016  Elsevier Interactive Patient Education © 2019 [employer] Inc[employer]

## 2018-02-22 ENCOUNTER — Ambulatory Visit (HOSPITAL_COMMUNITY)
Admission: RE | Admit: 2018-02-22 | Discharge: 2018-02-22 | Disposition: A | Discharge status: Home / Self Care | Payer: Self-pay | Source: Ambulatory Visit | Attending: Physician Assistant | Admitting: Physician Assistant

## 2018-02-22 DIAGNOSIS — R079 Chest pain, unspecified: Secondary | ICD-10-CM | POA: Insufficient documentation

## 2018-02-22 DIAGNOSIS — S161XXA Strain of muscle, fascia and tendon at neck level, initial encounter: Secondary | ICD-10-CM | POA: Insufficient documentation

## 2018-02-22 DIAGNOSIS — S29019A Strain of muscle and tendon of unspecified wall of thorax, initial encounter: Secondary | ICD-10-CM | POA: Insufficient documentation

## 2018-02-22 DIAGNOSIS — R29898 Other symptoms and signs involving the musculoskeletal system: Secondary | ICD-10-CM | POA: Insufficient documentation

## 2018-03-01 ENCOUNTER — Telehealth: Payer: Self-pay

## 2018-03-01 NOTE — Telephone Encounter (Signed)
Contacted pt to go over lab results the was picked up then it got disconnected will try pt again at a later date

## 2018-03-01 NOTE — Telephone Encounter (Signed)
Pt returned call went over xray results pt doesn't have any questions or concerns

## 2018-03-02 MED FILL — NAPROXEN 500 MG TABLET: 500 | 10 days supply | Qty: 20 | Fill #1

## 2018-03-05 MED FILL — METHOCARBAMOL 500 MG TABS: 500 | 15 days supply | Qty: 90 | Fill #1

## 2018-03-12 ENCOUNTER — Ambulatory Visit: Payer: Self-pay | Attending: Nurse Practitioner | Admitting: Nurse Practitioner

## 2018-03-12 ENCOUNTER — Encounter: Payer: Self-pay | Admitting: Nurse Practitioner

## 2018-03-12 VITALS — BP 130/78 | HR 76 | Temp 98.7°F | Ht 67.0 in | Wt 206.2 lb

## 2018-03-12 DIAGNOSIS — K219 Gastro-esophageal reflux disease without esophagitis: Secondary | ICD-10-CM | POA: Insufficient documentation

## 2018-03-12 DIAGNOSIS — F5101 Primary insomnia: Secondary | ICD-10-CM | POA: Insufficient documentation

## 2018-03-12 DIAGNOSIS — F329 Major depressive disorder, single episode, unspecified: Secondary | ICD-10-CM | POA: Insufficient documentation

## 2018-03-12 DIAGNOSIS — M25512 Pain in left shoulder: Secondary | ICD-10-CM | POA: Insufficient documentation

## 2018-03-12 DIAGNOSIS — M542 Cervicalgia: Secondary | ICD-10-CM | POA: Insufficient documentation

## 2018-03-12 DIAGNOSIS — Z880 Allergy status to penicillin: Secondary | ICD-10-CM | POA: Insufficient documentation

## 2018-03-12 DIAGNOSIS — Z79899 Other long term (current) drug therapy: Secondary | ICD-10-CM | POA: Insufficient documentation

## 2018-03-12 DIAGNOSIS — F419 Anxiety disorder, unspecified: Secondary | ICD-10-CM | POA: Insufficient documentation

## 2018-03-12 DIAGNOSIS — E039 Hypothyroidism, unspecified: Secondary | ICD-10-CM | POA: Insufficient documentation

## 2018-03-12 MED ORDER — MELATONIN 3 MG PO TABS: 3.0000 mg | ORAL_TABLET | Freq: Every day | ORAL | 1 refills | Status: DC

## 2018-03-12 MED ORDER — DICLOFENAC SODIUM 1 % TD GEL: 2.0000 g | Freq: Four times a day (QID) | TRANSDERMAL | 1 refills | Status: AC

## 2018-03-12 MED FILL — DICLOFENAC SODIUM 1% GEL: 1 | 30 days supply | Qty: 100 | Fill #0

## 2018-03-12 NOTE — Patient Instructions (Signed)
Insomnia Insomnia is a sleep disorder that makes it difficult to fall asleep or stay asleep. Insomnia can cause fatigue, low energy, difficulty concentrating, mood swings, and poor performance at work or school. There are three different ways to classify insomnia:  Difficulty falling asleep.  Difficulty staying asleep.  Waking up too early in the morning. Any type of insomnia can be long-term (chronic) or short-term (acute). Both are common. Short-term insomnia usually lasts for three months or less. Chronic insomnia occurs at least three times a week for longer than three months. What are the causes? Insomnia may be caused by another condition, situation, or substance, such as:  Anxiety.  Certain medicines.  Gastroesophageal reflux disease (GERD) or other gastrointestinal conditions.  Asthma or other breathing conditions.  Restless legs syndrome, sleep apnea, or other sleep disorders.  Chronic pain.  Menopause.  Stroke.  Abuse of alcohol, tobacco, or illegal drugs.  Mental health conditions, such as depression.  Caffeine.  Neurological disorders, such as Alzheimer's disease.  An overactive thyroid (hyperthyroidism). Sometimes, the cause of insomnia may not be known. What increases the risk? Risk factors for insomnia include:  Gender. Women are affected more often than men.  Age. Insomnia is more common as you get older.  Stress.  Lack of exercise.  Irregular work schedule or working night shifts.  Traveling between different time zones.  Certain medical and mental health conditions. What are the signs or symptoms? If you have insomnia, the main symptom is having trouble falling asleep or having trouble staying asleep. This may lead to other symptoms, such as:  Feeling fatigued or having low energy.  Feeling nervous about going to sleep.  Not feeling rested in the morning.  Having trouble concentrating.  Feeling irritable, anxious, or depressed. How  is this diagnosed? This condition may be diagnosed based on:  Your symptoms and medical history. Your health care provider may ask about: ? Your sleep habits. ? Any medical conditions you have. ? Your mental health.  A physical exam. How is this treated? Treatment for insomnia depends on the cause. Treatment may focus on treating an underlying condition that is causing insomnia. Treatment may also include:  Medicines to help you sleep.  Counseling or therapy.  Lifestyle adjustments to help you sleep better. Follow these instructions at home: Eating and drinking   Limit or avoid alcohol, caffeinated beverages, and cigarettes, especially close to bedtime. These can disrupt your sleep.  Do not eat a large meal or eat spicy foods right before bedtime. This can lead to digestive discomfort that can make it hard for you to sleep. Sleep habits   Keep a sleep diary to help you and your health care provider figure out what could be causing your insomnia. Write down: ? When you sleep. ? When you wake up during the night. ? How well you sleep. ? How rested you feel the next day. ? Any side effects of medicines you are taking. ? What you eat and drink.  Make your bedroom a dark, comfortable place where it is easy to fall asleep. ? Put up shades or blackout curtains to block light from outside. ? Use a white noise machine to block noise. ? Keep the temperature cool.  Limit screen use before bedtime. This includes: ? Watching TV. ? Using your smartphone, tablet, or computer.  Stick to a routine that includes going to bed and waking up at the same times every day and night. This can help you fall asleep faster. Consider   making a quiet activity, such as reading, part of your nighttime routine.  Try to avoid taking naps during the day so that you sleep better at night.  Get out of bed if you are still awake after 15 minutes of trying to sleep. Keep the lights down, but try reading or  doing a quiet activity. When you feel sleepy, go back to bed. General instructions  Take over-the-counter and prescription medicines only as told by your health care provider.  Exercise regularly, as told by your health care provider. Avoid exercise starting several hours before bedtime.  Use relaxation techniques to manage stress. Ask your health care provider to suggest some techniques that may work well for you. These may include: ? Breathing exercises. ? Routines to release muscle tension. ? Visualizing peaceful scenes.  Make sure that you drive carefully. Avoid driving if you feel very sleepy.  Keep all follow-up visits as told by your health care provider. This is important. Contact a health care provider if:  You are tired throughout the day.  You have trouble in your daily routine due to sleepiness.  You continue to have sleep problems, or your sleep problems get worse. Get help right away if:  You have serious thoughts about hurting yourself or someone else. If you ever feel like you may hurt yourself or others, or have thoughts about taking your own life, get help right away. You can go to your nearest emergency department or call:  Your local emergency services (911 in the [place]  A suicide crisis helpline, such as the [facility] at 361 739 0490. This is open 24 hours a day. Summary  Insomnia is a sleep disorder that makes it difficult to fall asleep or stay asleep.  Insomnia can be long-term (chronic) or short-term (acute).  Treatment for insomnia depends on the cause. Treatment may focus on treating an underlying condition that is causing insomnia.  Keep a sleep diary to help you and your health care provider figure out what could be causing your insomnia. This information is not intended to replace advice given to you by your health care provider. Make sure you discuss any questions you have with your health care provider. Document  Released: 01/22/2000 Document Revised: 11/03/2016 Document Reviewed: 11/03/2016 Elsevier Interactive Patient Education  2019 [employer].  Muscle Pain, Adult Muscle pain (myalgia) may be mild or severe. In most cases, the pain lasts only a short time and it goes away without treatment. It is normal to feel some muscle pain after starting a workout program. Muscles that have not been used often will be sore at first. Muscle pain may also be caused by many other things, including:  Overuse or muscle strain, especially if you are not in shape. This is the most common cause of muscle pain.  Injury.  Bruises.  Viruses, such as the flu.  Infectious diseases.  A chronic condition that causes muscle tenderness, fatigue, and headache (fibromyalgia).  A condition, such as lupus, in which the body's disease-fighting system attacks other organs in the body (autoimmune or rheumatologic diseases).  Certain drugs, including ACE inhibitors and statins. To diagnose the cause of your muscle pain, your health care provider will do a physical exam and ask questions about the pain and when it began. If you have not had muscle pain for very long, your health care provider may want to wait before doing much testing. If your muscle pain has lasted a long time, your health care provider may want  to run tests right away. In some cases, this may include tests to rule out certain conditions or illnesses. Treatment for muscle pain depends on the cause. Home care is often enough to relieve muscle pain. Your health care provider may also prescribe anti-inflammatory medicine. Follow these instructions at home: Activity  If overuse is causing your muscle pain: ? Slow down your activities until the pain goes away. ? Do regular, gentle exercises if you are not usually active. ? Warm up before exercising. Stretch before and after exercising. This can help lower the risk of muscle pain.  Do not continue working out if  the pain is very bad. Bad pain could mean that you have injured a muscle. Managing pain and discomfort   If directed, apply ice to the sore muscle: ? Put ice in a plastic bag. ? Place a towel between your skin and the bag. ? Leave the ice on for 20 minutes, 2-3 times a day.  You may also alternate between applying ice and applying heat as told by your health care provider. To apply heat, use the heat source that your health care provider recommends, such as a moist heat pack or a heating pad. ? Place a towel between your skin and the heat source. ? Leave the heat on for 20-30 minutes. ? Remove the heat if your skin turns bright red. This is especially important if you are unable to feel pain, heat, or cold. You may have a greater risk of getting burned. Medicines  Take over-the-counter and prescription medicines only as told by your health care provider.  Do not drive or use heavy machinery while taking prescription pain medicine. Contact a health care provider if:  Your muscle pain gets worse and medicines do not help.  You have muscle pain that lasts longer than 3 days.  You have a rash or fever along with muscle pain.  You have muscle pain after a tick bite.  You have muscle pain while working out, even though you are in good physical condition.  You have redness, soreness, or swelling along with muscle pain.  You have muscle pain after starting a new medicine or changing the dose of a medicine. Get help right away if:  You have trouble breathing.  You have trouble swallowing.  You have muscle pain along with a stiff neck, fever, and vomiting.  You have severe muscle weakness or cannot move part of your body. This information is not intended to replace advice given to you by your health care provider. Make sure you discuss any questions you have with your health care provider. Document Released: 12/16/2005 Document Revised: 08/14/2015 Document Reviewed:  06/16/2015 Elsevier Interactive Patient Education  2019 [employer].

## 2018-03-12 NOTE — Progress Notes (Addendum)
Assessment & Plan:  Diagnoses and all orders for this visit:  Acute pain of left shoulder -     diclofenac sodium (VOLTAREN) 1 % GEL; Apply 2 g topically 4 (four) times daily for 30 days. Continue Robaxin as prescribed. Continue exercises discussed in office today. Instructed patient to discontinue naproxen.  She is currently not taking her PPI protonix 40 mg daily as prescribed.  We discussed the long-term risk of prolonged NSAID use.  Primary insomnia -     Melatonin 3 MG TABS; Take 1 tablet (3 mg total) by mouth daily for 30 days.    Patient has been counseled on age-appropriate routine health concerns for screening and prevention. These are reviewed and up-to-date. Referrals have been placed accordingly. Immunizations are up-to-date or declined.    Subjective:   HPI Andrea Blackwell 63 y.o. female presents to office today with complaints of left shoulder pain. She fell on 02-18-2018 on her left side. 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 She was on a flat surface when she fell.  Per review of office note dated 02/21/2018 patient stated she had been seen in the emergency room in [facility] for the fall and was discharged home on no medications.  Today she reports an x-ray was performed of her left shoulder with no abnormal findings.  She is currently endorsing pain in her neck thoracic back laterally between the scapula and her left arm and shoulder.  She is having difficulty picking objects up. Could not carry her purse into church due to pain in her left shoulder. Relieving factors: heating pad, robaxin, bed massager.  She states a physical therapist student at her church had instructed her on certain exercises that she could do at home for her shoulder pain.  Currenlty taking naproxen and robaxin.  She has been taking naproxen 3 times per day which I have instructed her to discontinue at this time. Endorses pain  3/10 and is described as a dull ache. Will switch naproxen to voltaren gel today.   Insomnia Endorses difficulty falling asleep. Will start melatonin 3mg  nightly.      Review of Systems  Constitutional: Negative for fever, malaise/fatigue and weight loss.  HENT: Negative.  Negative for nosebleeds.   Eyes: Negative.  Negative for blurred vision, double vision and photophobia.  Respiratory: Negative.  Negative for cough, hemoptysis, sputum production, shortness of breath and wheezing.   Cardiovascular: Negative.  Negative for chest pain, palpitations and leg swelling.  Gastrointestinal: Negative.  Negative for heartburn, nausea and vomiting.  Musculoskeletal: Positive for back pain, falls, joint pain, myalgias and neck pain.  Neurological: Negative.  Negative for dizziness, focal weakness, seizures and headaches.  Psychiatric/Behavioral: Positive for depression. Negative for suicidal ideas. The patient is nervous/anxious and has insomnia.     Past Medical History:  Diagnosis Date  . Allergy   . Anxiety   . Cataract   . Depression   . GERD (gastroesophageal reflux disease)   . Hypothyroidism   . Pancreatitis     Past Surgical History:  Procedure Laterality Date  . CHOLECYSTECTOMY    . ENDOSCOPIC RETROGRADE CHOLANGIOPANCREATOGRAPHY (ERCP) WITH PROPOFOL N/A 02/02/2017   Procedure: ENDOSCOPIC RETROGRADE CHOLANGIOPANCREATOGRAPHY (ERCP) WITH PROPOFOL;  Surgeon: Milus Banister, MD;  Location: [facility];  Service: Endoscopy;  Laterality: N/A;  . ERCP N/A 12/10/2016   Procedure: ENDOSCOPIC RETROGRADE CHOLANGIOPANCREATOGRAPHY (ERCP);  Surgeon: Carol Ada, MD;  Location:  [facility];  Service: Endoscopy;  Laterality: N/A;  . ERCP N/A 12/14/2016   Procedure: ENDOSCOPIC RETROGRADE CHOLANGIOPANCREATOGRAPHY (ERCP);  Surgeon: Milus Banister, MD;  Location: [facility];  Service: Endoscopy;  Laterality: N/A;  . LAPAROSCOPIC CHOLECYSTECTOMY SINGLE SITE WITH INTRAOPERATIVE CHOLANGIOGRAM  N/A 12/13/2016   Procedure: LAPAROSCOPIC CHOLECYSTECTOMY SINGLE SITE AND LIVER BIOPSY;  Surgeon: Michael Boston, MD;  Location: [facility];  Service: General;  Laterality: N/A;  . OVARY SURGERY      Family History  Problem Relation Age of Onset  . Diabetes Maternal Grandfather   . Colon cancer Neg Hx   . Esophageal cancer Neg Hx   . Liver cancer Neg Hx   . Pancreatic cancer Neg Hx   . Rectal cancer Neg Hx   . Stomach cancer Neg Hx   . Breast cancer Neg Hx   . Prostate cancer Neg Hx     Social History Reviewed with no changes to be made today.   Outpatient Medications Prior to Visit  Medication Sig Dispense Refill  . cetirizine (ZYRTEC) 10 MG chewable tablet Chew 10 mg by mouth daily.    . fluticasone (FLONASE) 50 MCG/ACT nasal spray Place 2 sprays into both nostrils daily. 16 g 6  . levothyroxine (LEVOXYL) 150 MCG tablet Take 1 tablet (150 mcg total) by mouth daily before breakfast. 90 tablet 0  . methocarbamol (ROBAXIN) 500 MG tablet Take 2 tablets (1,000 mg total) by mouth 3 (three) times daily. X 10 days then prn 90 tablet 1  . Multiple Vitamin (MULTIVITAMIN WITH MINERALS) TABS tablet Take 1 tablet by mouth daily.    . Probiotic CAPS Take 1 capsule by mouth daily.    Marland Kitchen acetaminophen (TYLENOL) 500 MG tablet Take 500 mg by mouth every 6 (six) hours as needed.    . Ascorbic Acid (VITAMIN C PO) Take 1 tablet by mouth daily.    . busPIRone (BUSPAR) 15 MG tablet Take 1 tablet (15 mg total) by mouth 3 (three) times daily. (Patient not taking: Reported on 09/15/2017) 30 tablet 1  . escitalopram (LEXAPRO) 20 MG tablet Take 1 tablet (20 mg total) by mouth at bedtime. 90 tablet 1  . pantoprazole (PROTONIX) 40 MG tablet Take 1 tablet (40 mg total) by mouth daily. (Patient not taking: Reported on 03/24/2017) 90 tablet 1  . triamcinolone ointment (KENALOG) 0.5 % Apply 1 application topically 2 (two) times daily. (Patient not taking: Reported on 03/15/2017) 30 g 0  . naproxen (NAPROSYN) 500 MG tablet Take  1 tablet (500 mg total) by mouth 2 (two) times daily with a meal. X 10 days then prn 60 tablet 0   Facility-Administered Medications Prior to Visit  Medication Dose Route Frequency Provider Last Rate Last Dose  . 0.9 %  sodium chloride infusion  500 mL Intravenous Once Milus Banister, MD        Allergies  Allergen Reactions  . Penicillins Anaphylaxis and Other (See Comments)    Has patient had a PCN reaction causing immediate rash, facial/tongue/throat swelling, SOB or lightheadedness with hypotension: yes Has patient had a PCN reaction causing severe rash involving mucus membranes or skin necrosis: no Has patient had a PCN reaction that required hospitalization: yes Has patient had a PCN reaction occurring within the last 10 years: no If all of the above answers are "NO", then may proceed with Cephalosporin use.        Objective:    BP 130/78   Pulse 76   Temp 98.7 F (37.1  C) (Oral)   Ht 5\' 7"  (1.702 m)   Wt 206 lb 3.2 oz (93.5 kg)   SpO2 96%   BMI 32.30 kg/m  Wt Readings from Last 3 Encounters:  03/12/18 206 lb 3.2 oz (93.5 kg)  02/21/18 202 lb (91.6 kg)  12/20/17 199 lb (90.3 kg)    Physical Exam Vitals signs and nursing note reviewed.  Constitutional:      Appearance: She is well-developed.  HENT:     Head: Normocephalic and atraumatic.  Neck:     Musculoskeletal: Normal range of motion. No neck rigidity.  Cardiovascular:     Rate and Rhythm: Normal rate and regular rhythm.     Heart sounds: Normal heart sounds. No murmur. No friction rub. No gallop.   Pulmonary:     Effort: Pulmonary effort is normal. No tachypnea or respiratory distress.     Breath sounds: Normal breath sounds. No decreased breath sounds, wheezing, rhonchi or rales.  Chest:     Chest wall: No tenderness.  Abdominal:     General: Bowel sounds are normal.     Palpations: Abdomen is soft.  Musculoskeletal:     Left shoulder: She exhibits decreased range of motion (Pain is elicited with  active straight raise of left arm above head as well as with adduction of left arm), tenderness and pain. She exhibits no bony tenderness, no swelling, no effusion, no crepitus, no deformity and normal strength.     Left upper arm: She exhibits tenderness.  Skin:    General: Skin is warm and dry.  Neurological:     Mental Status: She is alert and oriented to person, place, and time.     Coordination: Coordination normal.  Psychiatric:        Behavior: Behavior normal. Behavior is cooperative.        Thought Content: Thought content normal.        Judgment: Judgment normal.          Patient has been counseled extensively about nutrition and exercise as well as the importance of adherence with medications and regular follow-up. The patient was given clear instructions to go to ER or return to medical center if symptoms don't improve, worsen or new problems develop. The patient verbalized understanding.   Follow-up: Return in about 3 weeks (around 04/02/2018) for shoulder pain.   Gildardo Pounds, FNP-BC [facility] and [facility] [place], [place]   03/12/2018, 11:38 AM

## 2018-03-19 ENCOUNTER — Ambulatory Visit: Payer: Self-pay | Attending: Family Medicine

## 2018-03-21 ENCOUNTER — Encounter: Payer: Self-pay | Admitting: Nurse Practitioner

## 2018-03-21 ENCOUNTER — Ambulatory Visit: Payer: Self-pay | Attending: Nurse Practitioner | Admitting: Nurse Practitioner

## 2018-03-21 VITALS — BP 119/75 | HR 83 | Temp 99.2°F | Ht 67.0 in | Wt 211.2 lb

## 2018-03-21 DIAGNOSIS — Z79899 Other long term (current) drug therapy: Secondary | ICD-10-CM | POA: Insufficient documentation

## 2018-03-21 DIAGNOSIS — M79642 Pain in left hand: Secondary | ICD-10-CM | POA: Insufficient documentation

## 2018-03-21 DIAGNOSIS — Z833 Family history of diabetes mellitus: Secondary | ICD-10-CM | POA: Insufficient documentation

## 2018-03-21 DIAGNOSIS — Z9049 Acquired absence of other specified parts of digestive tract: Secondary | ICD-10-CM | POA: Insufficient documentation

## 2018-03-21 DIAGNOSIS — S161XXA Strain of muscle, fascia and tendon at neck level, initial encounter: Secondary | ICD-10-CM | POA: Insufficient documentation

## 2018-03-21 DIAGNOSIS — E039 Hypothyroidism, unspecified: Secondary | ICD-10-CM | POA: Insufficient documentation

## 2018-03-21 DIAGNOSIS — S29012A Strain of muscle and tendon of back wall of thorax, initial encounter: Secondary | ICD-10-CM | POA: Insufficient documentation

## 2018-03-21 DIAGNOSIS — Z880 Allergy status to penicillin: Secondary | ICD-10-CM | POA: Insufficient documentation

## 2018-03-21 DIAGNOSIS — F419 Anxiety disorder, unspecified: Secondary | ICD-10-CM | POA: Insufficient documentation

## 2018-03-21 DIAGNOSIS — F329 Major depressive disorder, single episode, unspecified: Secondary | ICD-10-CM | POA: Insufficient documentation

## 2018-03-21 DIAGNOSIS — Z79890 Hormone replacement therapy (postmenopausal): Secondary | ICD-10-CM | POA: Insufficient documentation

## 2018-03-21 DIAGNOSIS — M79602 Pain in left arm: Secondary | ICD-10-CM | POA: Insufficient documentation

## 2018-03-21 DIAGNOSIS — K219 Gastro-esophageal reflux disease without esophagitis: Secondary | ICD-10-CM | POA: Insufficient documentation

## 2018-03-21 NOTE — Progress Notes (Signed)
Assessment & Plan:  Lexia was seen today for follow-up.  Diagnoses and all orders for this visit:  Left arm pain -     Ambulatory referral to Physical Therapy  Pain of left upper extremity -     Ambulatory referral to Physical Therapy  Strain of neck muscle, initial encounter -     Ambulatory referral to Physical Therapy  Strain of thoracic region, initial encounter -     Ambulatory referral to Physical Therapy    Patient has been counseled on age-appropriate routine health concerns for screening and prevention. These are reviewed and up-to-date. Referrals have been placed accordingly. Immunizations are up-to-date or declined.    Subjective:   Chief Complaint  Patient presents with  . Follow-up    Pt. stated she got approve for CAFA and would like a referral for physical therapy. Pt. stated the gel helps her.    HPI Guadalupe Lender 63 y.o. female presents to office today requesting a referral to physical therapy for her left shoulder and arm pain. Pain has been ongoing for several weeks now. She fell on 02-18-2018 on her left side. 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 She was on a flat surface when she fell.  Per review of office note dated 02/21/2018 by other provider here in this office noted that patient stated she had been seen in the emergency room in [facility] for the fall and was discharged home on no medications.  Imaging has been negative as well.   She is currently endorsing pain in her neck thoracic back laterally between the scapula and her left arm and shoulder.  She is having difficulty picking objects up. Could not carry her purse into church due to pain in her left shoulder. Relieving factors: heating pad, robaxin, bed massager, robaxin, voltaren gel.  She states a physical therapist student at her church had instructed her on certain exercises that she could do at home for her shoulder pain.   She had been taking naproxen 3 times per day which I have instructed her to discontinue. Endorses pain 3/10 and is described as a dull ache.    Review of Systems  Constitutional: Negative for fever, malaise/fatigue and weight loss.  HENT: Negative.  Negative for nosebleeds.   Eyes: Negative.  Negative for blurred vision, double vision and photophobia.  Respiratory: Negative.  Negative for cough and shortness of breath.   Cardiovascular: Negative.  Negative for chest pain, palpitations and leg swelling.  Gastrointestinal: Positive for heartburn. Negative for nausea and vomiting.  Musculoskeletal: Positive for joint pain, myalgias and neck pain.  Neurological: Negative.  Negative for dizziness, focal weakness, seizures and headaches.  Psychiatric/Behavioral: Negative for suicidal ideas. The patient is nervous/anxious.     Past Medical History:  Diagnosis Date  . Allergy   . Anxiety   . Cataract   . Depression   . GERD (gastroesophageal reflux disease)   . Hypothyroidism   . Pancreatitis     Past Surgical History:  Procedure Laterality Date  . CHOLECYSTECTOMY    . ENDOSCOPIC RETROGRADE CHOLANGIOPANCREATOGRAPHY (ERCP) WITH PROPOFOL N/A 02/02/2017   Procedure: ENDOSCOPIC RETROGRADE CHOLANGIOPANCREATOGRAPHY (ERCP) WITH PROPOFOL;  Surgeon: Milus Banister, MD;  Location: [facility];  Service: Endoscopy;  Laterality: N/A;  . ERCP N/A 12/10/2016   Procedure: ENDOSCOPIC RETROGRADE CHOLANGIOPANCREATOGRAPHY (ERCP);  Surgeon: Carol Ada, MD;  Location: [facility];  Service: Endoscopy;  Laterality: N/A;  .  ERCP N/A 12/14/2016   Procedure: ENDOSCOPIC RETROGRADE CHOLANGIOPANCREATOGRAPHY (ERCP);  Surgeon: Milus Banister, MD;  Location: [facility];  Service: Endoscopy;  Laterality: N/A;  . LAPAROSCOPIC CHOLECYSTECTOMY SINGLE SITE WITH INTRAOPERATIVE CHOLANGIOGRAM N/A 12/13/2016   Procedure: LAPAROSCOPIC CHOLECYSTECTOMY SINGLE SITE AND LIVER BIOPSY;  Surgeon: Michael Boston, MD;  Location:  [facility];  Service: General;  Laterality: N/A;  . OVARY SURGERY      Family History  Problem Relation Age of Onset  . Diabetes Maternal Grandfather   . Colon cancer Neg Hx   . Esophageal cancer Neg Hx   . Liver cancer Neg Hx   . Pancreatic cancer Neg Hx   . Rectal cancer Neg Hx   . Stomach cancer Neg Hx   . Breast cancer Neg Hx   . Prostate cancer Neg Hx     Social History Reviewed with no changes to be made today.   Outpatient Medications Prior to Visit  Medication Sig Dispense Refill  . diclofenac sodium (VOLTAREN) 1 % GEL Apply 2 g topically 4 (four) times daily for 30 days. 100 g 1  . fluticasone (FLONASE) 50 MCG/ACT nasal spray Place 2 sprays into both nostrils daily. 16 g 6  . levothyroxine (LEVOXYL) 150 MCG tablet Take 1 tablet (150 mcg total) by mouth daily before breakfast. 90 tablet 0  . methocarbamol (ROBAXIN) 500 MG tablet Take 2 tablets (1,000 mg total) by mouth 3 (three) times daily. X 10 days then prn 90 tablet 1  . Probiotic CAPS Take 1 capsule by mouth daily.    Marland Kitchen acetaminophen (TYLENOL) 500 MG tablet Take 500 mg by mouth every 6 (six) hours as needed.    . Ascorbic Acid (VITAMIN C PO) Take 1 tablet by mouth daily.    . cetirizine (ZYRTEC) 10 MG chewable tablet Chew 10 mg by mouth daily.    . Melatonin 3 MG TABS Take 1 tablet (3 mg total) by mouth daily for 30 days. 30 tablet 1  . Multiple Vitamin (MULTIVITAMIN WITH MINERALS) TABS tablet Take 1 tablet by mouth daily.    Marland Kitchen escitalopram (LEXAPRO) 20 MG tablet Take 1 tablet (20 mg total) by mouth at bedtime. 90 tablet 1  . triamcinolone ointment (KENALOG) 0.5 % Apply 1 application topically 2 (two) times daily. 30 g 0  . busPIRone (BUSPAR) 15 MG tablet Take 1 tablet (15 mg total) by mouth 3 (three) times daily. (Patient not taking: Reported on 09/15/2017) 30 tablet 1  . pantoprazole (PROTONIX) 40 MG tablet Take 1 tablet (40 mg total) by mouth daily. (Patient not taking: Reported on 03/24/2017) 90 tablet 1  . 0.9 %  sodium  chloride infusion      No facility-administered medications prior to visit.     Allergies  Allergen Reactions  . Penicillins Anaphylaxis and Other (See Comments)    Has patient had a PCN reaction causing immediate rash, facial/tongue/throat swelling, SOB or lightheadedness with hypotension: yes Has patient had a PCN reaction causing severe rash involving mucus membranes or skin necrosis: no Has patient had a PCN reaction that required hospitalization: yes Has patient had a PCN reaction occurring within the last 10 years: no If all of the above answers are "NO", then may proceed with Cephalosporin use.        Objective:    BP 119/75 (BP Location: Right Arm, Patient Position: Sitting, Cuff Size: Normal)   Pulse 83   Temp 99.2 F (37.3 C) (Oral)   Ht 5\' 7"  (1.702 m)  Wt 211 lb 3.2 oz (95.8 kg)   SpO2 96%   BMI 33.08 kg/m  Wt Readings from Last 3 Encounters:  03/21/18 211 lb 3.2 oz (95.8 kg)  03/12/18 206 lb 3.2 oz (93.5 kg)  02/21/18 202 lb (91.6 kg)    Physical Exam Vitals signs and nursing note reviewed.  Constitutional:      Appearance: She is well-developed.  HENT:     Head: Normocephalic and atraumatic.  Neck:     Musculoskeletal: Normal range of motion. Normal range of motion. Muscular tenderness present. No edema, neck rigidity, crepitus or spinous process tenderness.  Cardiovascular:     Rate and Rhythm: Normal rate and regular rhythm.     Heart sounds: Normal heart sounds. No murmur. No friction rub. No gallop.   Pulmonary:     Effort: Pulmonary effort is normal. No tachypnea or respiratory distress.     Breath sounds: Normal breath sounds. No decreased breath sounds, wheezing, rhonchi or rales.  Chest:     Chest wall: No tenderness.  Abdominal:     General: Bowel sounds are normal.     Palpations: Abdomen is soft.  Musculoskeletal:     Left shoulder: She exhibits decreased range of motion.     Left upper arm: She exhibits tenderness (myalgia). She  exhibits no bony tenderness, no swelling, no edema, no deformity and no laceration.       Arms:  Skin:    General: Skin is warm and dry.  Neurological:     Mental Status: She is alert and oriented to person, place, and time.     Coordination: Coordination normal.  Psychiatric:        Behavior: Behavior normal. Behavior is cooperative.        Thought Content: Thought content normal.        Judgment: Judgment normal.        Patient has been counseled extensively about nutrition and exercise as well as the importance of adherence with medications and regular follow-up. The patient was given clear instructions to go to ER or return to medical center if symptoms don't improve, worsen or new problems develop. The patient verbalized understanding.   Follow-up: No follow-ups on file.   Gildardo Pounds, FNP-BC [facility] and [place], [place]   03/23/2018, 11:40 PM

## 2018-03-22 ENCOUNTER — Ambulatory Visit: Payer: Self-pay | Attending: Nurse Practitioner | Admitting: Physical Therapy

## 2018-03-22 ENCOUNTER — Other Ambulatory Visit: Payer: Self-pay

## 2018-03-22 ENCOUNTER — Encounter: Payer: Self-pay | Admitting: Physical Therapy

## 2018-03-22 DIAGNOSIS — M62838 Other muscle spasm: Secondary | ICD-10-CM | POA: Insufficient documentation

## 2018-03-22 DIAGNOSIS — M6281 Muscle weakness (generalized): Secondary | ICD-10-CM | POA: Insufficient documentation

## 2018-03-22 DIAGNOSIS — M25512 Pain in left shoulder: Secondary | ICD-10-CM | POA: Insufficient documentation

## 2018-03-22 DIAGNOSIS — G8929 Other chronic pain: Secondary | ICD-10-CM | POA: Insufficient documentation

## 2018-03-22 NOTE — Therapy (Signed)
[place], [place], [address] Phone: [phone]   Fax:  [phone]  Physical Therapy Evaluation  Patient Details  Name: Andrea Blackwell MRN: [id number] Date of Birth: 25-Jun-1955 Referring Provider (PT): Gildardo Pounds, NP   Encounter Date: 03/22/2018  PT End of Session - 03/22/18 1725    Visit Number  1    Number of Visits  13    Date for PT Re-Evaluation  05/03/18    Authorization Type  Self pay    PT Start Time  1550    PT Stop Time  1631    PT Time Calculation (min)  41 min    Activity Tolerance  Patient tolerated treatment well;Patient limited by pain    Behavior During Therapy  [facility] for tasks assessed/performed       Past Medical History:  Diagnosis Date  . Allergy   . Anxiety   . Cataract   . Depression   . GERD (gastroesophageal reflux disease)   . Hypothyroidism   . Pancreatitis     Past Surgical History:  Procedure Laterality Date  . CHOLECYSTECTOMY    . ENDOSCOPIC RETROGRADE CHOLANGIOPANCREATOGRAPHY (ERCP) WITH PROPOFOL N/A 02/02/2017   Procedure: ENDOSCOPIC RETROGRADE CHOLANGIOPANCREATOGRAPHY (ERCP) WITH PROPOFOL;  Surgeon: Milus Banister, MD;  Location: [facility];  Service: Endoscopy;  Laterality: N/A;  . ERCP N/A 12/10/2016   Procedure: ENDOSCOPIC RETROGRADE CHOLANGIOPANCREATOGRAPHY (ERCP);  Surgeon: Carol Ada, MD;  Location: [facility];  Service: Endoscopy;  Laterality: N/A;  . ERCP N/A 12/14/2016   Procedure: ENDOSCOPIC RETROGRADE CHOLANGIOPANCREATOGRAPHY (ERCP);  Surgeon: Milus Banister, MD;  Location: [facility];  Service: Endoscopy;  Laterality: N/A;  . LAPAROSCOPIC CHOLECYSTECTOMY SINGLE SITE WITH INTRAOPERATIVE CHOLANGIOGRAM N/A 12/13/2016   Procedure: LAPAROSCOPIC CHOLECYSTECTOMY SINGLE SITE AND LIVER BIOPSY;  Surgeon: Michael Boston, MD;  Location: [facility];  Service: General;  Laterality: N/A;  . OVARY SURGERY      There were no vitals filed for this  visit.   Subjective Assessment - 03/22/18 1600    Subjective  pt is 44 F with CC of L shoulder pain that occured from a fall and she landed on her L shoulder that occured on 02/18/2018. She noted instant tightness and pain and was immediately transported to the hospital and had imaging on the shoulder. Since the fall pain continues to worsen especially as the day progresses. she reports numbness with stretching in to the L hand. Pain is in the shoulder, and between the shoulder. no PMHX regarding the L shoulder.    Limitations  Lifting;Sitting    How long can you sit comfortably?  30-45 min     How long can you stand comfortably?  unlimited    How long can you walk comfortably?  unlimited    Diagnostic tests  CT at hospital in [place], and x-ray 02/22/2018    Patient Stated Goals  to decrease pain, be able ot wlak the dog, improve posture    Currently in Pain?  Yes    Pain Score  2    last used pain medication cream 3 x today, at worst 6/10    Pain Location  Shoulder    Pain Orientation  Left    Pain Descriptors / Indicators  Dull;Throbbing;Sharp;Sore;Burning    Pain Type  Chronic pain    Pain Onset  More than a month ago    Pain Frequency  Constant    Aggravating Factors   prolonged sitting, using the L arm, moving  in specific motion, donning/ doffing clothing    Pain Relieving Factors  topical ointment, heating pad, massage    Effect of Pain on Daily Activities  limited lifting, using the LUE         [facility] PT Assessment - 03/22/18 1555      Assessment   Medical Diagnosis  Left arm pain     Referring Provider (PT)  Gildardo Pounds, NP    Onset Date/Surgical Date  02/18/18    Hand Dominance  Right    Next MD Visit  --   05/02/2018   Prior Therapy  yes      Precautions   Precautions  None      Restrictions   Weight Bearing Restrictions  No      Balance Screen   Has the patient fallen in the past 6 months  Yes    How many times?  1    Has the patient had a decrease in  activity level because of a fear of falling?   No    Is the patient reluctant to leave their home because of a fear of falling?   No      Home Social worker  Private residence    Living Arrangements  Non-relatives/Friends    Available Help at Discharge  Available PRN/intermittently    Type of Shepherd to enter    Entrance Stairs-Number of Steps  8    Entrance Stairs-Rails  Right   ascending   Home Layout  One level    Home Equipment  None      Prior Function   Level of Independence  Independent with basic ADLs    Vocation  Unemployed    Leisure  reading, buy and sell items, fixing antique floor lamps      Cognition   Overall Cognitive Status  Within Functional Limits for tasks assessed      Observation/Other Assessments   Focus on Therapeutic Outcomes (FOTO)   70% limited   predicted 44% limited     Posture/Postural Control   Posture/Postural Control  Postural limitations    Postural Limitations  Rounded Shoulders;Forward head      ROM / Strength   AROM / PROM / Strength  AROM;Strength;PROM      AROM   Overall AROM Comments  bil flexion [facility]    AROM Assessment Site  Shoulder    Right/Left Shoulder  Right;Left    Right Shoulder Extension  77 Degrees    Right Shoulder Flexion  132 Degrees    Right Shoulder ABduction  144 Degrees    Right Shoulder Internal Rotation  --   T5   Right Shoulder External Rotation  --   T2   Left Shoulder Extension  47 Degrees   ERP   Left Shoulder Flexion  130 Degrees   ERP and pain with lowering   Left Shoulder ABduction  86 Degrees   significant pain during movement   Left Shoulder Internal Rotation  --   T5   Left Shoulder External Rotation  --   T2     PROM   Overall PROM Comments  guarding noted with L shoulder ROM    PROM Assessment Site  Shoulder;Cervical    Right/Left Shoulder  Left    Left Shoulder ABduction  110 Degrees    Cervical - Right Side Bend  25   end range pain on  the L  Cervical - Left Side Bend  30    Cervical - Right Rotation  58    Cervical - Left Rotation  63      Strength   Strength Assessment Site  Shoulder;Hand    Right/Left Shoulder  Right;Left    Right Shoulder Flexion  4/5    Right Shoulder Extension  4/5    Right Shoulder ABduction  4/5    Right Shoulder Internal Rotation  4/5    Right Shoulder External Rotation  4/5    Left Shoulder Flexion  3+/5    Left Shoulder Extension  3+/5    Left Shoulder ABduction  3/5    Left Shoulder Internal Rotation  3+/5    Left Shoulder External Rotation  3+/5    Right Hand Grip (lbs)  44.6   41,47,46   Left Hand Grip (lbs)  32   30,31,35     Palpation   Palpation comment  TTP along the L upper trap/ levator scapulae L>R, L Rhomboids,  bil thoracic paraspinals between T4-T9, and T7 spinous process      Special Tests    Special Tests  Rotator Cuff Impingement    Rotator Cuff Impingment tests  Painful Arc of Motion;Hawkins- Kennedy test;Empty Can test;Full Can test;other      Hawkins-Kennedy test   Findings  Positive    Side  Left    Comments  in horizontal adducted position      Empty Can test   Findings  Not tested      Full Can test   Findings  Not tested      Painful Arc of Motion   Findings  Positive    Side  Left      other   Findings  Positive    Side  Left    Comments  scapular assist test                Objective measurements completed on examination: See above findings.      [facility] Adult PT Treatment/Exercise - 03/22/18 1555      Shoulder Exercises: Seated   Retraction  Strengthening;Both;10 reps      Neck Exercises: Stretches   Upper Trapezius Stretch  2 reps;30 seconds;Left   cues to back of stretch slightly to avoid pain   Levator Stretch  2 reps;30 seconds;Left   cues to back of stretch slightly to avoid pain            PT Education - 03/22/18 1725    Education Details  evaluation findings, POC, Goals, HEp with proper form/ rationale.      Person(s) Educated  Patient    Methods  Explanation;Verbal cues;Handout    Comprehension  Verbalized understanding;Verbal cues required       PT Short Term Goals - 03/22/18 1736      PT SHORT TERM GOAL #1   Title  pt to be I with inital HEP    Time  3    Period  Weeks    Status  New    Target Date  04/12/18      PT SHORT TERM GOAL #2   Title  pt to demo/ verbalize proper posture and lifting mechanics to reduce and prevent neck/ shoulder pain     Time  3    Period  Weeks    Status  New    Target Date  04/12/18      PT SHORT TERM GOAL #3   Title  increase L  grip strength by >/= 10# to demo improvement in shoulder function    Time  3    Period  Weeks    Status  New    Target Date  04/12/18        PT Long Term Goals - 03/22/18 1737      PT LONG TERM GOAL #1   Title  increase L shoulder Abduction to >/= 110 degrees and maintain all other ROM measures with </=2/10 pain for functional ROM    Time  6    Period  Weeks    Status  New    Target Date  05/03/18      PT LONG TERM GOAL #2   Title  Increase L shoulder strength to >/= 4/5 to promote scapulohumeral rhythm and assist with shoulder stability     Time  6    Period  Weeks    Status  New    Target Date  05/03/18      PT LONG TERM GOAL #3   Title  pt to be able to lift/ carry from overhead shelf >/= 10# for funtional strength required for ADLs    Time  6    Period  Weeks    Status  New    Target Date  05/03/18      PT LONG TERM GOAL #4   Title  increase FOTO score to </= 44% limited to demo improvement in function     Time  6    Period  Weeks    Status  New    Target Date  05/03/18      PT LONG TERM GOAL #5   Title  pt to be I with all HEP given as of last visit to maintain and progress current level of function     Time  6    Period  Weeks    Status  New    Target Date  05/03/18             Plan - 03/22/18 1731    Clinical Impression Statement  pt present to OPPT with CC of L shoulder pain  following a fall landing on the L  on 02/18/2018. she demonstrates limited L shoulder AROM and strength secondary to pain, limited PROM due to guard and pain. TTP noted in the upper trap/ levator scapulae and thoracic paraspinals. Special testing suggest high probability of impingement. She would benefit from physical therapy to decrease L shoulder pain, improve mobility and strength, and return to PLOF by addressing the deficits listed.     History and Personal Factors relevant to plan of care:  hx of depression     Clinical Presentation  Evolving    Clinical Presentation due to:  limited shoulder mobility, decreased strength, muscle spasm, fluctuating shoulder pain    Clinical Decision Making  Moderate    Rehab Potential  Good    PT Frequency  2x / week    PT Duration  6 weeks    PT Treatment/Interventions  ADLs/Self Care Home Management;Cryotherapy;Electrical Stimulation;Iontophoresis 4mg /ml Dexamethasone;Moist Heat;Traction;Ultrasound;Therapeutic activities;Patient/family education;Therapeutic exercise;Dry needling;Passive range of motion;Taping;Manual techniques    PT Next Visit Plan  review/ update HEP PRN, STW along upper trap, rhomboids and thoracic paraspinals, thoracic mobility, scapular mobs, modalities for pain.    PT Home Exercise Plan  scapular retraction, upper trap stretch, levator scapulae stretch, book opening, chin tuck    Consulted and Agree with Plan of Care  Patient  Patient will benefit from skilled therapeutic intervention in order to improve the following deficits and impairments:  Pain, Impaired UE functional use, Increased fascial restricitons, Decreased strength, Postural dysfunction, Improper body mechanics, Decreased range of motion, Increased muscle spasms, Decreased endurance, Decreased balance, Decreased activity tolerance  Visit Diagnosis: Chronic left shoulder pain  Muscle weakness (generalized)  Other muscle spasm     Problem List Patient Active  Problem List   Diagnosis Date Noted  . Encounter for removal of biliary stent   . Anxiety and depression 02/01/2017  . Bile duct leak   . Acute calculous cholecystitis 12/13/2016  . Jaundice 12/13/2016  . Obesity 12/13/2016  . Hypothyroidism   . Gallstone pancreatitis 12/10/2016   Starr Lake PT, DPT, LAT, ATC  03/22/18  5:42 PM      [place] [facility] [address] [place], [place], [address] Phone: [phone]   Fax:  [phone]  Name: Andrea Blackwell MRN: [id number] Date of Birth: 1955-06-04

## 2018-03-23 ENCOUNTER — Encounter: Payer: Self-pay | Admitting: Nurse Practitioner

## 2018-03-23 ENCOUNTER — Ambulatory Visit: Payer: Self-pay | Admitting: Nurse Practitioner

## 2018-03-27 ENCOUNTER — Encounter: Payer: Self-pay | Admitting: Physical Therapy

## 2018-03-27 ENCOUNTER — Ambulatory Visit: Payer: Self-pay | Admitting: Physical Therapy

## 2018-03-27 DIAGNOSIS — G8929 Other chronic pain: Secondary | ICD-10-CM

## 2018-03-27 DIAGNOSIS — M62838 Other muscle spasm: Secondary | ICD-10-CM

## 2018-03-27 DIAGNOSIS — M6281 Muscle weakness (generalized): Secondary | ICD-10-CM

## 2018-03-27 DIAGNOSIS — M25512 Pain in left shoulder: Principal | ICD-10-CM

## 2018-03-27 NOTE — Therapy (Signed)
[place], [place], [address] Phone: [phone]   Fax:  [phone]  Physical Therapy Treatment  Patient Details  Name: Andrea Blackwell MRN: [id number] Date of Birth: August 14, 1955 Referring Provider (PT): Gildardo Pounds, NP   Encounter Date: 03/27/2018  PT End of Session - 03/27/18 1152    Visit Number  2    Number of Visits  13    Date for PT Re-Evaluation  05/03/18    Authorization Type  Self pay    PT Start Time  1152    PT Stop Time  1241    PT Time Calculation (min)  49 min    Activity Tolerance  Patient tolerated treatment well;Patient limited by pain    Behavior During Therapy  [facility] for tasks assessed/performed       Past Medical History:  Diagnosis Date  . Allergy   . Anxiety   . Cataract   . Depression   . GERD (gastroesophageal reflux disease)   . Hypothyroidism   . Pancreatitis     Past Surgical History:  Procedure Laterality Date  . CHOLECYSTECTOMY    . ENDOSCOPIC RETROGRADE CHOLANGIOPANCREATOGRAPHY (ERCP) WITH PROPOFOL N/A 02/02/2017   Procedure: ENDOSCOPIC RETROGRADE CHOLANGIOPANCREATOGRAPHY (ERCP) WITH PROPOFOL;  Surgeon: Milus Banister, MD;  Location: [facility];  Service: Endoscopy;  Laterality: N/A;  . ERCP N/A 12/10/2016   Procedure: ENDOSCOPIC RETROGRADE CHOLANGIOPANCREATOGRAPHY (ERCP);  Surgeon: Carol Ada, MD;  Location: [facility];  Service: Endoscopy;  Laterality: N/A;  . ERCP N/A 12/14/2016   Procedure: ENDOSCOPIC RETROGRADE CHOLANGIOPANCREATOGRAPHY (ERCP);  Surgeon: Milus Banister, MD;  Location: [facility];  Service: Endoscopy;  Laterality: N/A;  . LAPAROSCOPIC CHOLECYSTECTOMY SINGLE SITE WITH INTRAOPERATIVE CHOLANGIOGRAM N/A 12/13/2016   Procedure: LAPAROSCOPIC CHOLECYSTECTOMY SINGLE SITE AND LIVER BIOPSY;  Surgeon: Michael Boston, MD;  Location: [facility];  Service: General;  Laterality: N/A;  . OVARY SURGERY      There were no vitals filed for this  visit.  Subjective Assessment - 03/27/18 1154    Subjective  "I am still feeling sore, but I didn't do any the exercises. I am planning to do the exercises today"     Patient Stated Goals  to decrease pain, be able ot wlak the dog, improve posture    Currently in Pain?  Yes    Pain Score  1    3/10   Pain Orientation  Left    Pain Descriptors / Indicators  Sore;Aching    Pain Type  Chronic pain    Pain Onset  More than a month ago                       [facility] Adult PT Treatment/Exercise - 03/27/18 0001      Shoulder Exercises: Pulleys   Flexion  2 minutes    Scaption  2 minutes      Modalities   Modalities  Moist Heat;Electrical Stimulation      Moist Heat Therapy   Number Minutes Moist Heat  10 Minutes    Moist Heat Location  Shoulder   L shoulder     Electrical Stimulation   Electrical Stimulation Location  L peri-scapular region     Electrical Stimulation Action  IFC    Electrical Stimulation Parameters  L8 x 10 min, 100% scan    Electrical Stimulation Goals  Pain      Manual Therapy   Manual Therapy  Scapular mobilization;Other (comment)    Manual  therapy comments  MTPR along L upper trap , levator     Scapular Mobilization  grade I-II scapular upward rotation    Other Manual Therapy  desensitization along L peri-scapular region      Neck Exercises: Stretches   Upper Trapezius Stretch  1 rep;30 seconds    Levator Stretch  1 rep;30 seconds             PT Education - 03/27/18 1254    Education Details  reviewed previous HEP. discussed benefits of E-stim. began pain education regarding tissues heal and the perception of threat.     Person(s) Educated  Patient    Methods  Explanation;Verbal cues    Comprehension  Verbalized understanding;Verbal cues required       PT Short Term Goals - 03/22/18 1736      PT SHORT TERM GOAL #1   Title  pt to be I with inital HEP    Time  3    Period  Weeks    Status  New    Target Date  04/12/18       PT SHORT TERM GOAL #2   Title  pt to demo/ verbalize proper posture and lifting mechanics to reduce and prevent neck/ shoulder pain     Time  3    Period  Weeks    Status  New    Target Date  04/12/18      PT SHORT TERM GOAL #3   Title  increase L grip strength by >/= 10# to demo improvement in shoulder function    Time  3    Period  Weeks    Status  New    Target Date  04/12/18        PT Long Term Goals - 03/22/18 1737      PT LONG TERM GOAL #1   Title  increase L shoulder Abduction to >/= 110 degrees and maintain all other ROM measures with </=2/10 pain for functional ROM    Time  6    Period  Weeks    Status  New    Target Date  05/03/18      PT LONG TERM GOAL #2   Title  Increase L shoulder strength to >/= 4/5 to promote scapulohumeral rhythm and assist with shoulder stability     Time  6    Period  Weeks    Status  New    Target Date  05/03/18      PT LONG TERM GOAL #3   Title  pt to be able to lift/ carry from overhead shelf >/= 10# for funtional strength required for ADLs    Time  6    Period  Weeks    Status  New    Target Date  05/03/18      PT LONG TERM GOAL #4   Title  increase FOTO score to </= 44% limited to demo improvement in function     Time  6    Period  Weeks    Status  New    Target Date  05/03/18      PT LONG TERM GOAL #5   Title  pt to be I with all HEP given as of last visit to maintain and progress current level of function     Time  6    Period  Weeks    Status  New    Target Date  05/03/18  Plan - 03/27/18 1251    Clinical Impression Statement  pt returns following her first visit noting she hasn't been consistent with her HEP. She demonstrate increased hypersensitivty in the L peri-scapular region. continued working on scapular mobility which she demonstrated significant guarding. worked on [place] and scapular setting. trialed E-stim and MHp end of session to calm down pain/ soreness.     PT Treatment/Interventions   ADLs/Self Care Home Management;Cryotherapy;Electrical Stimulation;Iontophoresis 4mg /ml Dexamethasone;Moist Heat;Traction;Ultrasound;Therapeutic activities;Patient/family education;Therapeutic exercise;Dry needling;Passive range of motion;Taping;Manual techniques    PT Next Visit Plan  review HEP, STW along upper trap, rhomboids and thoracic paraspinals, thoracic mobility, scapular mobs, modalities for pain.    PT Home Exercise Plan  scapular retraction, upper trap stretch, levator scapulae stretch, book opening, chin tuck    Consulted and Agree with Plan of Care  Patient       Patient will benefit from skilled therapeutic intervention in order to improve the following deficits and impairments:  Pain, Impaired UE functional use, Increased fascial restricitons, Decreased strength, Postural dysfunction, Improper body mechanics, Decreased range of motion, Increased muscle spasms, Decreased endurance, Decreased balance, Decreased activity tolerance  Visit Diagnosis: Chronic left shoulder pain  Muscle weakness (generalized)  Other muscle spasm     Problem List Patient Active Problem List   Diagnosis Date Noted  . Encounter for removal of biliary stent   . Anxiety and depression 02/01/2017  . Bile duct leak   . Acute calculous cholecystitis 12/13/2016  . Jaundice 12/13/2016  . Obesity 12/13/2016  . Hypothyroidism   . Gallstone pancreatitis 12/10/2016   Starr Lake PT, DPT, LAT, ATC  03/27/18  12:55 PM      [place] [facility] [facility] [address] [place], [place], [address] Phone: [phone]   Fax:  [phone]  Name: Andrea Blackwell MRN: [id number] Date of Birth: [birth year]

## 2018-03-30 ENCOUNTER — Other Ambulatory Visit: Payer: Self-pay | Admitting: Physician Assistant

## 2018-03-30 ENCOUNTER — Ambulatory Visit: Payer: Self-pay | Admitting: Physical Therapy

## 2018-03-30 ENCOUNTER — Encounter: Payer: Self-pay | Admitting: Physical Therapy

## 2018-03-30 DIAGNOSIS — M79602 Pain in left arm: Secondary | ICD-10-CM

## 2018-03-30 DIAGNOSIS — M25512 Pain in left shoulder: Principal | ICD-10-CM

## 2018-03-30 DIAGNOSIS — M6281 Muscle weakness (generalized): Secondary | ICD-10-CM

## 2018-03-30 DIAGNOSIS — S161XXA Strain of muscle, fascia and tendon at neck level, initial encounter: Secondary | ICD-10-CM

## 2018-03-30 DIAGNOSIS — G8929 Other chronic pain: Secondary | ICD-10-CM

## 2018-03-30 DIAGNOSIS — M62838 Other muscle spasm: Secondary | ICD-10-CM

## 2018-03-30 DIAGNOSIS — S29019A Strain of muscle and tendon of unspecified wall of thorax, initial encounter: Secondary | ICD-10-CM

## 2018-03-30 MED FILL — ESCITALOPRAM 20 MG TABLET: 20 | 30 days supply | Qty: 30 | Fill #4

## 2018-03-30 MED FILL — LEVOTHYROXINE 150 MCG TAB: 150 | 30 days supply | Qty: 30 | Fill #2

## 2018-03-30 MED FILL — DICLOFENAC SODIUM 1% GEL: 1 | 25 days supply | Qty: 100 | Fill #1

## 2018-03-30 NOTE — Therapy (Signed)
[place], [place], [address] Phone: [phone]   Fax:  [phone]  Physical Therapy Treatment  Patient Details  Name: Andrea Blackwell MRN: [id number] Date of Birth: [birth year] Referring Provider (PT): Gildardo Pounds, NP   Encounter Date: 03/30/2018  PT End of Session - 03/30/18 0750    Visit Number  3    Number of Visits  13    Date for PT Re-Evaluation  05/03/18    Authorization Type  Self pay    PT Start Time  0744    PT Stop Time  0845    PT Time Calculation (min)  61 min    Activity Tolerance  Patient tolerated treatment well;Patient limited by pain    Behavior During Therapy  [facility] for tasks assessed/performed       Past Medical History:  Diagnosis Date  . Allergy   . Anxiety   . Cataract   . Depression   . GERD (gastroesophageal reflux disease)   . Hypothyroidism   . Pancreatitis     Past Surgical History:  Procedure Laterality Date  . CHOLECYSTECTOMY    . ENDOSCOPIC RETROGRADE CHOLANGIOPANCREATOGRAPHY (ERCP) WITH PROPOFOL N/A 02/02/2017   Procedure: ENDOSCOPIC RETROGRADE CHOLANGIOPANCREATOGRAPHY (ERCP) WITH PROPOFOL;  Surgeon: Milus Banister, MD;  Location: [facility];  Service: Endoscopy;  Laterality: N/A;  . ERCP N/A 12/10/2016   Procedure: ENDOSCOPIC RETROGRADE CHOLANGIOPANCREATOGRAPHY (ERCP);  Surgeon: Carol Ada, MD;  Location: [facility];  Service: Endoscopy;  Laterality: N/A;  . ERCP N/A 12/14/2016   Procedure: ENDOSCOPIC RETROGRADE CHOLANGIOPANCREATOGRAPHY (ERCP);  Surgeon: Milus Banister, MD;  Location: [facility];  Service: Endoscopy;  Laterality: N/A;  . LAPAROSCOPIC CHOLECYSTECTOMY SINGLE SITE WITH INTRAOPERATIVE CHOLANGIOGRAM N/A 12/13/2016   Procedure: LAPAROSCOPIC CHOLECYSTECTOMY SINGLE SITE AND LIVER BIOPSY;  Surgeon: Michael Boston, MD;  Location: [facility];  Service: General;  Laterality: N/A;  . OVARY SURGERY      There were no vitals filed for this  visit.  Subjective Assessment - 03/30/18 0747    Subjective  I have 5/10 shoulder pain, L side from having to clean off the windshield.            [facility] Adult PT Treatment/Exercise - 03/30/18 0001      Neck Exercises: Supine   Neck Retraction  10 reps;5 secs      Shoulder Exercises: Supine   Horizontal ABduction  AAROM;Both;10 reps    Internal Rotation Weight (lbs)       Flexion  AAROM;Both;10 reps    Shoulder Flexion Weight (lbs)  cane     Other Supine Exercises  pec stretch     Other Supine Exercises  scap retraction x 10 palms up       Shoulder Exercises: Seated   Retraction  Strengthening;Both;10 reps    Retraction Weight (lbs)  against wall for posture      Shoulder Exercises: ROM/Strengthening   UBE (Upper Arm Bike)  4 min L1, pain L UE       Moist Heat Therapy   Number Minutes Moist Heat  10 Minutes    Moist Heat Location  Shoulder      Electrical Stimulation   Electrical Stimulation Location  L peri-scapular region     Electrical Stimulation Action  IFC    Electrical Stimulation Parameters  15 min, 13 V    Electrical Stimulation Goals  Pain      Manual Therapy   Manual Therapy  Passive ROM    Passive  ROM  L UE all planes, pain in flexion and abduction      Neck Exercises: Stretches   Upper Trapezius Stretch  2 reps;30 seconds    Levator Stretch  2 reps;30 seconds    Corner Stretch  --   too pain ful , did x 1               PT Short Term Goals - 03/30/18 0751      PT SHORT TERM GOAL #1   Title  pt to be I with inital HEP    Status  Achieved      PT SHORT TERM GOAL #2   Title  pt to demo/ verbalize proper posture and lifting mechanics to reduce and prevent neck/ shoulder pain     Status  On-going      PT SHORT TERM GOAL #3   Title  increase L grip strength by >/= 10# to demo improvement in shoulder function    Status  Unable to assess        PT Long Term Goals - 03/30/18 0751      PT LONG TERM GOAL #1   Title  increase L shoulder  Abduction to >/= 110 degrees and maintain all other ROM measures with </=2/10 pain for functional ROM    Status  On-going      PT LONG TERM GOAL #2   Title  Increase L shoulder strength to >/= 4/5 to promote scapulohumeral rhythm and assist with shoulder stability     Status  On-going      PT LONG TERM GOAL #3   Title  pt to be able to lift/ carry from overhead shelf >/= 10# for funtional strength required for ADLs    Status  On-going      PT LONG TERM GOAL #4   Title  increase FOTO score to </= 44% limited to demo improvement in function     Status  On-going      PT LONG TERM GOAL #5   Title  pt to be I with all HEP given as of last visit to maintain and progress current level of function     Status  On-going            Plan - 03/30/18 3295    Clinical Impression Statement  Patient has ben more diligent with HEP.  Needed min cues for technique.  AAROM in supine for shoulder, increased pain as she lowered arm from flexion.  Pain in L elbow seems referred from scapular region.  Sensitive but well tolerated overall.     PT Treatment/Interventions  ADLs/Self Care Home Management;Cryotherapy;Electrical Stimulation;Iontophoresis 4mg /ml Dexamethasone;Moist Heat;Traction;Ultrasound;Therapeutic activities;Patient/family education;Therapeutic exercise;Dry needling;Passive range of motion;Taping;Manual techniques    PT Next Visit Plan  review HEP, STW along upper trap, rhomboids and thoracic paraspinals, thoracic mobility, scapular mobs, modalities for pain.    PT Home Exercise Plan  scapular retraction, upper trap stretch, levator scapulae stretch, book opening, chin tuck    Consulted and Agree with Plan of Care  Patient       Patient will benefit from skilled therapeutic intervention in order to improve the following deficits and impairments:  Pain, Impaired UE functional use, Increased fascial restricitons, Decreased strength, Postural dysfunction, Improper body mechanics, Decreased range  of motion, Increased muscle spasms, Decreased endurance, Decreased balance, Decreased activity tolerance  Visit Diagnosis: Chronic left shoulder pain  Muscle weakness (generalized)  Other muscle spasm     Problem List Patient Active Problem List  Diagnosis Date Noted  . Encounter for removal of biliary stent   . Anxiety and depression 02/01/2017  . Bile duct leak   . Acute calculous cholecystitis 12/13/2016  . Jaundice 12/13/2016  . Obesity 12/13/2016  . Hypothyroidism   . Gallstone pancreatitis 12/10/2016    Manveer Gomes 03/30/2018, 8:35 AM  [facility] [address] [place], [place], [address] Phone: [phone]   Fax:  [phone]  Name: Turquoise Esch MRN: [id number] Date of Birth: 07-19-1955  Raeford Razor, PT 03/30/18 8:36 AM Phone: [phone] Fax: [phone]

## 2018-04-03 ENCOUNTER — Ambulatory Visit: Payer: Self-pay | Admitting: Physical Therapy

## 2018-04-03 ENCOUNTER — Ambulatory Visit: Payer: Self-pay | Admitting: Nurse Practitioner

## 2018-04-03 ENCOUNTER — Encounter: Payer: Self-pay | Admitting: Physical Therapy

## 2018-04-03 DIAGNOSIS — G8929 Other chronic pain: Secondary | ICD-10-CM

## 2018-04-03 DIAGNOSIS — M62838 Other muscle spasm: Secondary | ICD-10-CM

## 2018-04-03 DIAGNOSIS — M25512 Pain in left shoulder: Principal | ICD-10-CM

## 2018-04-03 DIAGNOSIS — M6281 Muscle weakness (generalized): Secondary | ICD-10-CM

## 2018-04-03 NOTE — Therapy (Signed)
[place], [place], [address] Phone: [phone]   Fax:  [phone]  Physical Therapy Treatment  Patient Details  Name: Andrea Blackwell MRN: [id number] Date of Birth: 1955/05/16 Referring Provider (PT): Gildardo Pounds, NP   Encounter Date: 04/03/2018  PT End of Session - 04/03/18 1208    Visit Number  4    Number of Visits  13    Date for PT Re-Evaluation  05/03/18    Authorization Type  Self pay    PT Start Time  9449    PT Stop Time  1232    PT Time Calculation (min)  47 min       Past Medical History:  Diagnosis Date  . Allergy   . Anxiety   . Cataract   . Depression   . GERD (gastroesophageal reflux disease)   . Hypothyroidism   . Pancreatitis     Past Surgical History:  Procedure Laterality Date  . CHOLECYSTECTOMY    . ENDOSCOPIC RETROGRADE CHOLANGIOPANCREATOGRAPHY (ERCP) WITH PROPOFOL N/A 02/02/2017   Procedure: ENDOSCOPIC RETROGRADE CHOLANGIOPANCREATOGRAPHY (ERCP) WITH PROPOFOL;  Surgeon: Milus Banister, MD;  Location: [facility];  Service: Endoscopy;  Laterality: N/A;  . ERCP N/A 12/10/2016   Procedure: ENDOSCOPIC RETROGRADE CHOLANGIOPANCREATOGRAPHY (ERCP);  Surgeon: Carol Ada, MD;  Location: [facility];  Service: Endoscopy;  Laterality: N/A;  . ERCP N/A 12/14/2016   Procedure: ENDOSCOPIC RETROGRADE CHOLANGIOPANCREATOGRAPHY (ERCP);  Surgeon: Milus Banister, MD;  Location: [facility];  Service: Endoscopy;  Laterality: N/A;  . LAPAROSCOPIC CHOLECYSTECTOMY SINGLE SITE WITH INTRAOPERATIVE CHOLANGIOGRAM N/A 12/13/2016   Procedure: LAPAROSCOPIC CHOLECYSTECTOMY SINGLE SITE AND LIVER BIOPSY;  Surgeon: Michael Boston, MD;  Location: [facility];  Service: General;  Laterality: N/A;  . OVARY SURGERY      There were no vitals filed for this visit.  Subjective Assessment - 04/03/18 1152    Subjective  Shoulder is doing great. I am doing the exercises more often than prescriped. I had major  dental work yesterday.     Currently in Pain?  Yes    Pain Score  1     Pain Location  Shoulder    Pain Orientation  Left    Pain Descriptors / Indicators  Sore;Aching         [facility] PT Assessment - 04/03/18 0001      AROM   Left Shoulder Flexion  140 Degrees    Left Shoulder ABduction  140 Degrees    Left Shoulder Internal Rotation  --   T5   Left Shoulder External Rotation  --   T2                  [facility] Adult PT Treatment/Exercise - 04/03/18 0001      Neck Exercises: Standing   Neck Retraction  10 reps      Shoulder Exercises: Standing   Extension  20 reps    Theraband Level (Shoulder Extension)  Level 2 (Red)    Row  20 reps;Theraband    Theraband Level (Shoulder Row)  Level 2 (Red)    Retraction Limitations  red band with ER x 10       Shoulder Exercises: Pulleys   Flexion  2 minutes    Scaption  2 minutes      Shoulder Exercises: ROM/Strengthening   UBE (Upper Arm Bike)  4 min L1, (2 min each way)       Moist Heat Therapy   Number Minutes Moist Heat  15 Minutes    Moist Heat Location  Shoulder      Electrical Stimulation   Electrical Stimulation Location  L peri-scapular region     Electrical Stimulation Action  IFCx 15 min    Electrical Stimulation Parameters  to tolerance    Electrical Stimulation Goals  Pain      Manual Therapy   Manual Therapy  --    Passive ROM  --      Neck Exercises: Stretches   Upper Trapezius Stretch  2 reps;30 seconds    Levator Stretch  2 reps;30 seconds             PT Education - 04/03/18 1225    Education Details  HEP    Person(s) Educated  Patient    Methods  Explanation;Handout    Comprehension  Verbalized understanding       PT Short Term Goals - 03/30/18 0751      PT SHORT TERM GOAL #1   Title  pt to be I with inital HEP    Status  Achieved      PT SHORT TERM GOAL #2   Title  pt to demo/ verbalize proper posture and lifting mechanics to reduce and prevent neck/ shoulder pain     Status   On-going      PT SHORT TERM GOAL #3   Title  increase L grip strength by >/= 10# to demo improvement in shoulder function    Status  Unable to assess        PT Long Term Goals - 03/30/18 0751      PT LONG TERM GOAL #1   Title  increase L shoulder Abduction to >/= 110 degrees and maintain all other ROM measures with </=2/10 pain for functional ROM    Status  On-going      PT LONG TERM GOAL #2   Title  Increase L shoulder strength to >/= 4/5 to promote scapulohumeral rhythm and assist with shoulder stability     Status  On-going      PT LONG TERM GOAL #3   Title  pt to be able to lift/ carry from overhead shelf >/= 10# for funtional strength required for ADLs    Status  On-going      PT LONG TERM GOAL #4   Title  increase FOTO score to </= 44% limited to demo improvement in function     Status  On-going      PT LONG TERM GOAL #5   Title  pt to be I with all HEP given as of last visit to maintain and progress current level of function     Status  On-going            Plan - 04/03/18 1154    Clinical Impression Statement  Pt reports less trouble with don/doffing clothing and motions are improved that were previously more painful. Notices pain with lifting/carrying. She also noted that she was carrying her small purse in left hand without noticing. AROM for flexion and abduction improved. Began scapular bands with good tolerance. Updated HEP. IFC at end of session per her request.     PT Next Visit Plan  review and progress strengthening if still doing well next visit. IFC prn, check scapular position with lifting     PT Home Exercise Plan  scapular retraction, upper trap stretch, levator scapulae stretch, book opening, chin tuck, standing rows and extension, bialteral ER with retraction red     Consulted  and Agree with Plan of Care  Patient       Patient will benefit from skilled therapeutic intervention in order to improve the following deficits and impairments:  Pain,  Impaired UE functional use, Increased fascial restricitons, Decreased strength, Postural dysfunction, Improper body mechanics, Decreased range of motion, Increased muscle spasms, Decreased endurance, Decreased balance, Decreased activity tolerance  Visit Diagnosis: Chronic left shoulder pain  Muscle weakness (generalized)  Other muscle spasm     Problem List Patient Active Problem List   Diagnosis Date Noted  . Encounter for removal of biliary stent   . Anxiety and depression 02/01/2017  . Bile duct leak   . Acute calculous cholecystitis 12/13/2016  . Jaundice 12/13/2016  . Obesity 12/13/2016  . Hypothyroidism   . Gallstone pancreatitis 12/10/2016    Andrea Blackwell, PTA 04/03/2018, 1:06 PM  [facility] [address] [place], [place], [address] Phone: [phone]   Fax:  [phone]  Name: Andrea Blackwell MRN: [id number] Date of Birth: 1955-06-26

## 2018-04-06 ENCOUNTER — Ambulatory Visit: Payer: Self-pay | Admitting: Physical Therapy

## 2018-04-06 ENCOUNTER — Encounter: Payer: Self-pay | Admitting: Physical Therapy

## 2018-04-06 DIAGNOSIS — M25512 Pain in left shoulder: Principal | ICD-10-CM

## 2018-04-06 DIAGNOSIS — M62838 Other muscle spasm: Secondary | ICD-10-CM

## 2018-04-06 DIAGNOSIS — G8929 Other chronic pain: Secondary | ICD-10-CM

## 2018-04-06 DIAGNOSIS — M6281 Muscle weakness (generalized): Secondary | ICD-10-CM

## 2018-04-06 NOTE — Therapy (Signed)
[place], [place], [address] Phone: [phone]   Fax:  [phone]  Physical Therapy Treatment  Patient Details  Name: Andrea Blackwell MRN: [id number] Date of Birth: 1956-02-04 Referring Provider (PT): Gildardo Pounds, NP   Encounter Date: 04/06/2018  PT End of Session - 04/06/18 0858    Visit Number  5    Number of Visits  13    Date for PT Re-Evaluation  05/03/18    Authorization Type  Self pay    PT Start Time  6283   pt arrived 13 min late today   PT Stop Time  0929    PT Time Calculation (min)  31 min    Activity Tolerance  Patient tolerated treatment well    Behavior During Therapy  [facility] for tasks assessed/performed       Past Medical History:  Diagnosis Date  . Allergy   . Anxiety   . Cataract   . Depression   . GERD (gastroesophageal reflux disease)   . Hypothyroidism   . Pancreatitis     Past Surgical History:  Procedure Laterality Date  . CHOLECYSTECTOMY    . ENDOSCOPIC RETROGRADE CHOLANGIOPANCREATOGRAPHY (ERCP) WITH PROPOFOL N/A 02/02/2017   Procedure: ENDOSCOPIC RETROGRADE CHOLANGIOPANCREATOGRAPHY (ERCP) WITH PROPOFOL;  Surgeon: Milus Banister, MD;  Location: [facility];  Service: Endoscopy;  Laterality: N/A;  . ERCP N/A 12/10/2016   Procedure: ENDOSCOPIC RETROGRADE CHOLANGIOPANCREATOGRAPHY (ERCP);  Surgeon: Carol Ada, MD;  Location: [facility];  Service: Endoscopy;  Laterality: N/A;  . ERCP N/A 12/14/2016   Procedure: ENDOSCOPIC RETROGRADE CHOLANGIOPANCREATOGRAPHY (ERCP);  Surgeon: Milus Banister, MD;  Location: [facility];  Service: Endoscopy;  Laterality: N/A;  . LAPAROSCOPIC CHOLECYSTECTOMY SINGLE SITE WITH INTRAOPERATIVE CHOLANGIOGRAM N/A 12/13/2016   Procedure: LAPAROSCOPIC CHOLECYSTECTOMY SINGLE SITE AND LIVER BIOPSY;  Surgeon: Michael Boston, MD;  Location: [facility];  Service: General;  Laterality: N/A;  . OVARY SURGERY      There were no vitals filed for this  visit.  Subjective Assessment - 04/06/18 0859    Subjective  "The shoulder is getting alot better. I am do feel alittle sore "    Patient Stated Goals  to decrease pain, be able ot wlak the dog, improve posture    Currently in Pain?  No/denies         [facility] PT Assessment - 04/06/18 0001      Assessment   Medical Diagnosis  Left arm pain     Referring Provider (PT)  Gildardo Pounds, NP                   [facility] Adult PT Treatment/Exercise - 04/06/18 0902      Lumbar Exercises: Seated   Other Seated Lumbar Exercises  anterior pelvic tilt 2 x 10 holding keeping core tight x 3 seconds   seated on dyna disc, demonstrate/ tactile cues for form     Shoulder Exercises: Seated   Row  Strengthening;15 reps;Theraband    Theraband Level (Shoulder Row)  Level 3 (Green)    Row Limitations  seated on dyna disc    Horizontal ABduction  Strengthening;10 reps;Theraband    Theraband Level (Shoulder Horizontal ABduction)  Level 2 (Red)    Horizontal ABduction Limitations  seated on dyna disc working keeping anterior pelvci tilt    Other Seated Exercises  exerternal rotation with scapular retraction 2 x 10 with green theraband   seated on dyna disc keeping anterior pelvic tilt  Shoulder Exercises: ROM/Strengthening   UBE (Upper Arm Bike)  4 min L1, (2 min each way)       Neck Exercises: Stretches   Upper Trapezius Stretch  1 rep;30 seconds    Levator Stretch  1 rep;30 seconds             PT Education - 04/06/18 0931    Education Details  updated HEP for anterior pelvic tilt and seated horizontal abduction    Person(s) Educated  Patient    Methods  Explanation;Demonstration;Handout;Verbal cues    Comprehension  Verbalized understanding;Returned demonstration;Verbal cues required;Tactile cues required       PT Short Term Goals - 03/30/18 0751      PT SHORT TERM GOAL #1   Title  pt to be I with inital HEP    Status  Achieved      PT SHORT TERM GOAL #2   Title  pt  to demo/ verbalize proper posture and lifting mechanics to reduce and prevent neck/ shoulder pain     Status  On-going      PT SHORT TERM GOAL #3   Title  increase L grip strength by >/= 10# to demo improvement in shoulder function    Status  Unable to assess        PT Long Term Goals - 03/30/18 0751      PT LONG TERM GOAL #1   Title  increase L shoulder Abduction to >/= 110 degrees and maintain all other ROM measures with </=2/10 pain for functional ROM    Status  On-going      PT LONG TERM GOAL #2   Title  Increase L shoulder strength to >/= 4/5 to promote scapulohumeral rhythm and assist with shoulder stability     Status  On-going      PT LONG TERM GOAL #3   Title  pt to be able to lift/ carry from overhead shelf >/= 10# for funtional strength required for ADLs    Status  On-going      PT LONG TERM GOAL #4   Title  increase FOTO score to </= 44% limited to demo improvement in function     Status  On-going      PT LONG TERM GOAL #5   Title  pt to be I with all HEP given as of last visit to maintain and progress current level of function     Status  On-going            Plan - 04/06/18 1610    Clinical Impression Statement  pt arrived 13 min late today. focused on good posture and posterior shoulder musculature in combination with maintaining good seated posture. she reports no pain today and did very well with all exercises requiring minimal cues for maintain good posture.     PT Next Visit Plan  review and progress strengthening if still doing well next visit. IFC prn, check scapular position with lifting, posture education and lifting mechanics    PT Home Exercise Plan  scapular retraction, upper trap stretch, levator scapulae stretch, book opening, chin tuck, standing rows and extension, bialteral ER with retraction red, horizontal abduction    Consulted and Agree with Plan of Care  Patient       Patient will benefit from skilled therapeutic intervention in order  to improve the following deficits and impairments:     Visit Diagnosis: Chronic left shoulder pain  Muscle weakness (generalized)  Other muscle spasm  Problem List Patient Active Problem List   Diagnosis Date Noted  . Encounter for removal of biliary stent   . Anxiety and depression 02/01/2017  . Bile duct leak   . Acute calculous cholecystitis 12/13/2016  . Jaundice 12/13/2016  . Obesity 12/13/2016  . Hypothyroidism   . Gallstone pancreatitis 12/10/2016   Starr Lake PT, DPT, LAT, ATC  04/06/18  9:33 AM      [facility] [address] [place], [place], [address] Phone: [phone]   Fax:  [phone]  Name: Klara Stjames MRN: [id number] Date of Birth: Jul 25, 1955

## 2018-04-09 ENCOUNTER — Ambulatory Visit: Payer: Self-pay | Attending: Nurse Practitioner | Admitting: Physical Therapy

## 2018-04-09 ENCOUNTER — Encounter: Payer: Self-pay | Admitting: Physical Therapy

## 2018-04-09 DIAGNOSIS — M25512 Pain in left shoulder: Secondary | ICD-10-CM | POA: Insufficient documentation

## 2018-04-09 DIAGNOSIS — G8929 Other chronic pain: Secondary | ICD-10-CM | POA: Insufficient documentation

## 2018-04-09 DIAGNOSIS — M62838 Other muscle spasm: Secondary | ICD-10-CM | POA: Insufficient documentation

## 2018-04-09 DIAGNOSIS — M6281 Muscle weakness (generalized): Secondary | ICD-10-CM | POA: Insufficient documentation

## 2018-04-09 NOTE — Patient Instructions (Signed)

## 2018-04-09 NOTE — Therapy (Signed)
[place], [place], [address] Phone: [phone]   Fax:  [phone]  Physical Therapy Treatment  Patient Details  Name: Andrea Blackwell MRN: [id number] Date of Birth: 01/11/1956 Referring Provider (PT): Gildardo Pounds, NP   Encounter Date: 04/09/2018  PT End of Session - 04/09/18 1024    Visit Number  6    Number of Visits  13    Date for PT Re-Evaluation  05/03/18    Authorization Type  Self pay    PT Start Time  1022    PT Stop Time  1100    PT Time Calculation (min)  38 min    Activity Tolerance  Patient tolerated treatment well    Behavior During Therapy  [facility] for tasks assessed/performed       Past Medical History:  Diagnosis Date  . Allergy   . Anxiety   . Cataract   . Depression   . GERD (gastroesophageal reflux disease)   . Hypothyroidism   . Pancreatitis     Past Surgical History:  Procedure Laterality Date  . CHOLECYSTECTOMY    . ENDOSCOPIC RETROGRADE CHOLANGIOPANCREATOGRAPHY (ERCP) WITH PROPOFOL N/A 02/02/2017   Procedure: ENDOSCOPIC RETROGRADE CHOLANGIOPANCREATOGRAPHY (ERCP) WITH PROPOFOL;  Surgeon: Milus Banister, MD;  Location: [facility];  Service: Endoscopy;  Laterality: N/A;  . ERCP N/A 12/10/2016   Procedure: ENDOSCOPIC RETROGRADE CHOLANGIOPANCREATOGRAPHY (ERCP);  Surgeon: Carol Ada, MD;  Location: [facility];  Service: Endoscopy;  Laterality: N/A;  . ERCP N/A 12/14/2016   Procedure: ENDOSCOPIC RETROGRADE CHOLANGIOPANCREATOGRAPHY (ERCP);  Surgeon: Milus Banister, MD;  Location: [facility];  Service: Endoscopy;  Laterality: N/A;  . LAPAROSCOPIC CHOLECYSTECTOMY SINGLE SITE WITH INTRAOPERATIVE CHOLANGIOGRAM N/A 12/13/2016   Procedure: LAPAROSCOPIC CHOLECYSTECTOMY SINGLE SITE AND LIVER BIOPSY;  Surgeon: Michael Boston, MD;  Location: [facility];  Service: General;  Laterality: N/A;  . OVARY SURGERY      There were no vitals filed for this visit.  Subjective Assessment -  04/09/18 1025    Subjective  " I am feeling ailttle sore, and went to the dentist which I think made more"     Patient Stated Goals  to decrease pain, be able ot wlak the dog, improve posture    Currently in Pain?  Yes    Pain Score  1     Pain Orientation  Left    Pain Type  Chronic pain    Pain Frequency  Intermittent    Aggravating Factors   repeated lifting    Pain Relieving Factors  topical ointment, heating pad, massage,          [facility] PT Assessment - 04/09/18 0001      Assessment   Medical Diagnosis  Left arm pain     Referring Provider (PT)  Gildardo Pounds, NP                   [facility] Adult PT Treatment/Exercise - 04/09/18 1037      Neck Exercises: Machines for Strengthening   Nustep  L5 x 5 min UE/LE      Shoulder Exercises: Supine   Other Supine Exercises  foam roll routine (laying on rolled towels) bil ceiling punches, horizontal abd/adduction, alternating ceiling punch, back stroke, and X to Y 1 x 10 ea.      Shoulder Exercises: Seated   Other Seated Exercises  thoracic rotation/ extension using black physioball.       Shoulder Exercises: Stretch   Other Shoulder  Stretches  Rhomoid stretch 2 x 30 sec      Modalities   Modalities  Iontophoresis      Iontophoresis   Type of Iontophoresis  Dexamethasone    Location  L shoulder    Dose  4mg /ml    Time  6 hour patch             PT Education - 04/09/18 1054    Education Details  reviewed/ reissued previous HEP. Benefits of iontophoresis and length of wear    Person(s) Educated  Patient    Methods  Explanation;Verbal cues    Comprehension  Verbalized understanding;Verbal cues required       PT Short Term Goals - 03/30/18 0751      PT SHORT TERM GOAL #1   Title  pt to be I with inital HEP    Status  Achieved      PT SHORT TERM GOAL #2   Title  pt to demo/ verbalize proper posture and lifting mechanics to reduce and prevent neck/ shoulder pain     Status  On-going      PT SHORT  TERM GOAL #3   Title  increase L grip strength by >/= 10# to demo improvement in shoulder function    Status  Unable to assess        PT Long Term Goals - 03/30/18 0751      PT LONG TERM GOAL #1   Title  increase L shoulder Abduction to >/= 110 degrees and maintain all other ROM measures with </=2/10 pain for functional ROM    Status  On-going      PT LONG TERM GOAL #2   Title  Increase L shoulder strength to >/= 4/5 to promote scapulohumeral rhythm and assist with shoulder stability     Status  On-going      PT LONG TERM GOAL #3   Title  pt to be able to lift/ carry from overhead shelf >/= 10# for funtional strength required for ADLs    Status  On-going      PT LONG TERM GOAL #4   Title  increase FOTO score to </= 44% limited to demo improvement in function     Status  On-going      PT LONG TERM GOAL #5   Title  pt to be I with all HEP given as of last visit to maintain and progress current level of function     Status  On-going            Plan - 04/09/18 1050    Clinical Impression Statement  pt reports increased soreness today which could be due to increased lifting and moving around. Focused on thoracic mobility and peri-scapular activation. She did all exercises well with mild report of soreness.     PT Treatment/Interventions  ADLs/Self Care Home Management;Cryotherapy;Electrical Stimulation;Iontophoresis 4mg /ml Dexamethasone;Moist Heat;Traction;Ultrasound;Therapeutic activities;Patient/family education;Therapeutic exercise;Dry needling;Passive range of motion;Taping;Manual techniques    PT Next Visit Plan  review and progress strengthening if still doing well next visit. IFC prn, check scapular position with lifting, posture education and lifting mechanics, given foam roll routine if tolerated well.     PT Home Exercise Plan  scapular retraction, upper trap stretch, levator scapulae stretch, book opening, chin tuck, standing rows and extension, bialteral ER with  retraction red, horizontal abduction    Consulted and Agree with Plan of Care  Patient       Patient will benefit from skilled therapeutic intervention in order  to improve the following deficits and impairments:  Pain, Impaired UE functional use, Increased fascial restricitons, Decreased strength, Postural dysfunction, Improper body mechanics, Decreased range of motion, Increased muscle spasms, Decreased endurance, Decreased balance, Decreased activity tolerance  Visit Diagnosis: Chronic left shoulder pain  Muscle weakness (generalized)  Other muscle spasm     Problem List Patient Active Problem List   Diagnosis Date Noted  . Encounter for removal of biliary stent   . Anxiety and depression 02/01/2017  . Bile duct leak   . Acute calculous cholecystitis 12/13/2016  . Jaundice 12/13/2016  . Obesity 12/13/2016  . Hypothyroidism   . Gallstone pancreatitis 12/10/2016    Starr Lake PT, DPT, LAT, ATC  04/09/18  11:24 AM      [facility] [address] [place], [place], [address] Phone: [phone]   Fax:  [phone]  Name: Andrea Blackwell MRN: [id number] Date of Birth: [birth year]

## 2018-04-13 ENCOUNTER — Ambulatory Visit: Payer: Self-pay | Admitting: Physical Therapy

## 2018-04-13 ENCOUNTER — Encounter: Payer: Self-pay | Admitting: Physical Therapy

## 2018-04-13 DIAGNOSIS — M6281 Muscle weakness (generalized): Secondary | ICD-10-CM

## 2018-04-13 DIAGNOSIS — M62838 Other muscle spasm: Secondary | ICD-10-CM

## 2018-04-13 DIAGNOSIS — G8929 Other chronic pain: Secondary | ICD-10-CM

## 2018-04-13 DIAGNOSIS — M25512 Pain in left shoulder: Principal | ICD-10-CM

## 2018-04-13 NOTE — Patient Instructions (Signed)
Access Code: [email or web address]  URL: [email or web address]  Date: 04/13/2018  Prepared by: Starr Lake   Program Notes  can use rolled up towels   Exercises  [facility] on Foam Roll - 10 reps - 2 sets - 1x daily - 7x weekly  Supine on Foam Roll Reach and Roll - 10 reps - 2 sets - 1x daily - 7x weekly  Thoracic Foam Roll Mobilization Bench Press - 10 reps - 2 sets - 1x daily - 7x weekly  Supine Chest Stretch on Foam Roll - 10 reps - 2 sets - 1x daily - 7x weekly

## 2018-04-13 NOTE — Therapy (Signed)
[place], [place], [address] Phone: [phone]   Fax:  [phone]  Physical Therapy Treatment  Patient Details  Name: Andrea Blackwell MRN: [id number] Date of Birth: 21-Apr-1955 Referring Provider (PT): Gildardo Pounds, NP   Encounter Date: 04/13/2018  PT End of Session - 04/13/18 0850    Visit Number  7    Number of Visits  13    Date for PT Re-Evaluation  05/03/18    Authorization Type  Self pay    PT Start Time  0851   pt arrived 6 min late   PT Stop Time  0930    PT Time Calculation (min)  39 min    Activity Tolerance  Patient tolerated treatment well    Behavior During Therapy  [facility] for tasks assessed/performed       Past Medical History:  Diagnosis Date  . Allergy   . Anxiety   . Cataract   . Depression   . GERD (gastroesophageal reflux disease)   . Hypothyroidism   . Pancreatitis     Past Surgical History:  Procedure Laterality Date  . CHOLECYSTECTOMY    . ENDOSCOPIC RETROGRADE CHOLANGIOPANCREATOGRAPHY (ERCP) WITH PROPOFOL N/A 02/02/2017   Procedure: ENDOSCOPIC RETROGRADE CHOLANGIOPANCREATOGRAPHY (ERCP) WITH PROPOFOL;  Surgeon: Milus Banister, MD;  Location: [facility];  Service: Endoscopy;  Laterality: N/A;  . ERCP N/A 12/10/2016   Procedure: ENDOSCOPIC RETROGRADE CHOLANGIOPANCREATOGRAPHY (ERCP);  Surgeon: Carol Ada, MD;  Location: [facility];  Service: Endoscopy;  Laterality: N/A;  . ERCP N/A 12/14/2016   Procedure: ENDOSCOPIC RETROGRADE CHOLANGIOPANCREATOGRAPHY (ERCP);  Surgeon: Milus Banister, MD;  Location: [facility];  Service: Endoscopy;  Laterality: N/A;  . LAPAROSCOPIC CHOLECYSTECTOMY SINGLE SITE WITH INTRAOPERATIVE CHOLANGIOGRAM N/A 12/13/2016   Procedure: LAPAROSCOPIC CHOLECYSTECTOMY SINGLE SITE AND LIVER BIOPSY;  Surgeon: Michael Boston, MD;  Location: [facility];  Service: General;  Laterality: N/A;  . OVARY SURGERY      There were no vitals filed for this  visit.  Subjective Assessment - 04/13/18 0851    Subjective  "I think I am doing okay, I just feel like I am moving as quickly"    Currently in Pain?  Yes    Pain Score  1     Pain Orientation  Left    Pain Descriptors / Indicators  Sore;Aching    Pain Type  Chronic pain    Pain Onset  More than a month ago    Pain Frequency  Intermittent         [facility] PT Assessment - 04/13/18 0001      Assessment   Medical Diagnosis  Left arm pain     Referring Provider (PT)  Gildardo Pounds, NP                   [facility] Adult PT Treatment/Exercise - 04/13/18 0856      Therapeutic Activites    Therapeutic Activities  Lifting    Lifting  lifting 12# box from floor to waist 1 x 10, and placing on elevated shelf 1 x 10   demonstrate for proper form     Neck Exercises: Machines for Strengthening   Nustep  L1 x 6 min    changing direction at 3 min.     Shoulder Exercises: Standing   Flexion  Strengthening;Both;12 reps;Weights   scaption angle   Shoulder Flexion Weight (lbs)  1      Shoulder Exercises: Stretch   Other Shoulder Stretches  Rhomoid stretch 2 x 30 sec      Neck Exercises: Stretches   Upper Trapezius Stretch  30 seconds    Levator Stretch  1 rep;30 seconds             PT Education - 04/13/18 0930    Education Details  updated HEp for foam roll routine    Person(s) Educated  Patient    Methods  Explanation;Verbal cues;Handout    Comprehension  Verbalized understanding;Verbal cues required       PT Short Term Goals - 03/30/18 0751      PT SHORT TERM GOAL #1   Title  pt to be I with inital HEP    Status  Achieved      PT SHORT TERM GOAL #2   Title  pt to demo/ verbalize proper posture and lifting mechanics to reduce and prevent neck/ shoulder pain     Status  On-going      PT SHORT TERM GOAL #3   Title  increase L grip strength by >/= 10# to demo improvement in shoulder function    Status  Unable to assess        PT Long Term Goals -  03/30/18 0751      PT LONG TERM GOAL #1   Title  increase L shoulder Abduction to >/= 110 degrees and maintain all other ROM measures with </=2/10 pain for functional ROM    Status  On-going      PT LONG TERM GOAL #2   Title  Increase L shoulder strength to >/= 4/5 to promote scapulohumeral rhythm and assist with shoulder stability     Status  On-going      PT LONG TERM GOAL #3   Title  pt to be able to lift/ carry from overhead shelf >/= 10# for funtional strength required for ADLs    Status  On-going      PT LONG TERM GOAL #4   Title  increase FOTO score to </= 44% limited to demo improvement in function     Status  On-going      PT LONG TERM GOAL #5   Title  pt to be I with all HEP given as of last visit to maintain and progress current level of function     Status  On-going            Plan - 04/13/18 0930    Clinical Impression Statement  1/10 pain noted today. focused on shoulder strengthening and functional lifting mechanics which she required verbal cues and demonstration for proper form. pt noted no pain or aggrivation at end of session.     PT Treatment/Interventions  ADLs/Self Care Home Management;Cryotherapy;Electrical Stimulation;Iontophoresis 4mg /ml Dexamethasone;Moist Heat;Traction;Ultrasound;Therapeutic activities;Patient/family education;Therapeutic exercise;Dry needling;Passive range of motion;Taping;Manual techniques    PT Next Visit Plan  review and progress strengthening if still doing well next visit. IFC prn, check scapular position with lifting, posture education and lifting mechanics.    PT Home Exercise Plan  scapular retraction, upper trap stretch, levator scapulae stretch, book opening, chin tuck, standing rows and extension, bialteral ER with retraction red, horizontal abduction.  foam roll routine    Consulted and Agree with Plan of Care  Patient       Patient will benefit from skilled therapeutic intervention in order to improve the following  deficits and impairments:  Pain, Impaired UE functional use, Increased fascial restricitons, Decreased strength, Postural dysfunction, Improper body mechanics, Decreased range of motion, Increased muscle spasms, Decreased  endurance, Decreased balance, Decreased activity tolerance  Visit Diagnosis: Chronic left shoulder pain  Muscle weakness (generalized)  Other muscle spasm     Problem List Patient Active Problem List   Diagnosis Date Noted  . Encounter for removal of biliary stent   . Anxiety and depression 02/01/2017  . Bile duct leak   . Acute calculous cholecystitis 12/13/2016  . Jaundice 12/13/2016  . Obesity 12/13/2016  . Hypothyroidism   . Gallstone pancreatitis 12/10/2016   Starr Lake PT, DPT, LAT, ATC  04/13/18  9:33 AM      [facility] [address] [place], [place], [address] Phone: [phone]   Fax:  [phone]  Name: Naraya Stoneberg MRN: [id number] Date of Birth: Jul 06, 1955

## 2018-04-16 ENCOUNTER — Ambulatory Visit: Payer: Self-pay | Admitting: Physical Therapy

## 2018-04-16 DIAGNOSIS — M25512 Pain in left shoulder: Principal | ICD-10-CM

## 2018-04-16 DIAGNOSIS — M6281 Muscle weakness (generalized): Secondary | ICD-10-CM

## 2018-04-16 DIAGNOSIS — M62838 Other muscle spasm: Secondary | ICD-10-CM

## 2018-04-16 DIAGNOSIS — G8929 Other chronic pain: Secondary | ICD-10-CM

## 2018-04-16 NOTE — Therapy (Signed)
[place], [place], [address] Phone: [phone]   Fax:  [phone]  Physical Therapy Treatment  Patient Details  Name: Andrea Blackwell MRN: [id number] Date of Birth: 02-Dec-1955 Referring Provider (PT): Gildardo Pounds, NP   Encounter Date: 04/16/2018  PT End of Session - 04/16/18 1020    Visit Number  8    Number of Visits  13    Date for PT Re-Evaluation  05/03/18    Authorization Type  Self pay    PT Start Time  1021   pt arrived 6 min late   PT Stop Time  1059    PT Time Calculation (min)  38 min    Activity Tolerance  Patient tolerated treatment well    Behavior During Therapy  [facility] for tasks assessed/performed       Past Medical History:  Diagnosis Date  . Allergy   . Anxiety   . Cataract   . Depression   . GERD (gastroesophageal reflux disease)   . Hypothyroidism   . Pancreatitis     Past Surgical History:  Procedure Laterality Date  . CHOLECYSTECTOMY    . ENDOSCOPIC RETROGRADE CHOLANGIOPANCREATOGRAPHY (ERCP) WITH PROPOFOL N/A 02/02/2017   Procedure: ENDOSCOPIC RETROGRADE CHOLANGIOPANCREATOGRAPHY (ERCP) WITH PROPOFOL;  Surgeon: Milus Banister, MD;  Location: [facility];  Service: Endoscopy;  Laterality: N/A;  . ERCP N/A 12/10/2016   Procedure: ENDOSCOPIC RETROGRADE CHOLANGIOPANCREATOGRAPHY (ERCP);  Surgeon: Carol Ada, MD;  Location: [facility];  Service: Endoscopy;  Laterality: N/A;  . ERCP N/A 12/14/2016   Procedure: ENDOSCOPIC RETROGRADE CHOLANGIOPANCREATOGRAPHY (ERCP);  Surgeon: Milus Banister, MD;  Location: [facility];  Service: Endoscopy;  Laterality: N/A;  . LAPAROSCOPIC CHOLECYSTECTOMY SINGLE SITE WITH INTRAOPERATIVE CHOLANGIOGRAM N/A 12/13/2016   Procedure: LAPAROSCOPIC CHOLECYSTECTOMY SINGLE SITE AND LIVER BIOPSY;  Surgeon: Michael Boston, MD;  Location: [facility];  Service: General;  Laterality: N/A;  . OVARY SURGERY      There were no vitals filed for this  visit.  Subjective Assessment - 04/16/18 1022    Subjective  "I had to really put on the pain stuff, I didn't do too much this weekend.     Currently in Pain?  Yes    Pain Score  1     Pain Orientation  Left    Pain Type  Chronic pain    Pain Frequency  Intermittent    Aggravating Factors   lifting, fine motor movements, and specific movements.          [facility] PT Assessment - 04/16/18 0001      Assessment   Medical Diagnosis  Left arm pain     Referring Provider (PT)  Gildardo Pounds, NP      Observation/Other Assessments   Focus on Therapeutic Outcomes (FOTO)   43% limited                   [facility] Adult PT Treatment/Exercise - 04/16/18 0001      Self-Care   Self-Care  Other Self-Care Comments    Other Self-Care Comments   MTPR along bil upper trap/ levator scapulae using [employer] and where she can purchase one      Shoulder Exercises: Seated   Extension  Strengthening;12 reps;Theraband    Theraband Level (Shoulder Extension)  Level 3 (Green)    Row  Strengthening;15 reps;Theraband    Theraband Level (Shoulder Row)  Level 3 (Green)      Shoulder Exercises: ROM/Strengthening   UBE (Upper Arm  Bike)  4 min L2, (2 min each way)       Manual Therapy   Other Manual Therapy  MTPR along along L upper trap      Neck Exercises: Stretches   Upper Trapezius Stretch  2 reps;30 seconds    Levator Stretch  30 seconds;2 reps             PT Education - 04/16/18 1115    Education Details  MTPR release tools and techniques/ benefits    Person(s) Educated  Patient    Methods  Explanation;Verbal cues    Comprehension  Verbalized understanding;Verbal cues required       PT Short Term Goals - 03/30/18 0751      PT SHORT TERM GOAL #1   Title  pt to be I with inital HEP    Status  Achieved      PT SHORT TERM GOAL #2   Title  pt to demo/ verbalize proper posture and lifting mechanics to reduce and prevent neck/ shoulder pain     Status  On-going      PT SHORT  TERM GOAL #3   Title  increase L grip strength by >/= 10# to demo improvement in shoulder function    Status  Unable to assess        PT Long Term Goals - 03/30/18 0751      PT LONG TERM GOAL #1   Title  increase L shoulder Abduction to >/= 110 degrees and maintain all other ROM measures with </=2/10 pain for functional ROM    Status  On-going      PT LONG TERM GOAL #2   Title  Increase L shoulder strength to >/= 4/5 to promote scapulohumeral rhythm and assist with shoulder stability     Status  On-going      PT LONG TERM GOAL #3   Title  pt to be able to lift/ carry from overhead shelf >/= 10# for funtional strength required for ADLs    Status  On-going      PT LONG TERM GOAL #4   Title  increase FOTO score to </= 44% limited to demo improvement in function     Status  On-going      PT LONG TERM GOAL #5   Title  pt to be I with all HEP given as of last visit to maintain and progress current level of function     Status  On-going            Plan - 04/16/18 1025    Clinical Impression Statement  pt reported feeling sore over the weekend. Educated about MTPR along the upper trap and rhomboids and what tools can be used to assist with muscle relief. continued shoulder strengthening which she noted improvement in pain and ease of activation.     PT Next Visit Plan  review and progress strengthening if still doing well next visit. IFC prn, check scapular position with lifting, posture education and lifting mechanics. DN education    PT Home Exercise Plan  scapular retraction, upper trap stretch, levator scapulae stretch, book opening, chin tuck, standing rows and extension, bialteral ER with retraction red, horizontal abduction.  foam roll routine    Consulted and Agree with Plan of Care  Patient       Patient will benefit from skilled therapeutic intervention in order to improve the following deficits and impairments:  Pain, Impaired UE functional use, Increased fascial  restricitons, Decreased strength, Postural  dysfunction, Improper body mechanics, Decreased range of motion, Increased muscle spasms, Decreased endurance, Decreased balance, Decreased activity tolerance  Visit Diagnosis: Chronic left shoulder pain  Muscle weakness (generalized)  Other muscle spasm     Problem List Patient Active Problem List   Diagnosis Date Noted  . Encounter for removal of biliary stent   . Anxiety and depression 02/01/2017  . Bile duct leak   . Acute calculous cholecystitis 12/13/2016  . Jaundice 12/13/2016  . Obesity 12/13/2016  . Hypothyroidism   . Gallstone pancreatitis 12/10/2016   Starr Lake PT, DPT, LAT, ATC  04/16/18  11:18 AM      [facility] [address] [place], [place], [address] Phone: [phone]   Fax:  [phone]  Name: Andrea Blackwell MRN: [id number] Date of Birth: 29-May-1955

## 2018-04-19 ENCOUNTER — Other Ambulatory Visit: Payer: Self-pay

## 2018-04-19 ENCOUNTER — Ambulatory Visit: Payer: Self-pay | Admitting: Physical Therapy

## 2018-04-19 ENCOUNTER — Encounter: Payer: Self-pay | Admitting: Physical Therapy

## 2018-04-19 DIAGNOSIS — M6281 Muscle weakness (generalized): Secondary | ICD-10-CM

## 2018-04-19 DIAGNOSIS — M25512 Pain in left shoulder: Principal | ICD-10-CM

## 2018-04-19 DIAGNOSIS — G8929 Other chronic pain: Secondary | ICD-10-CM

## 2018-04-19 DIAGNOSIS — M62838 Other muscle spasm: Secondary | ICD-10-CM

## 2018-04-19 NOTE — Therapy (Signed)
[place], [place], [address] Phone: [phone]   Fax:  [phone]  Physical Therapy Treatment  Patient Details  Name: Andrea Blackwell MRN: [id number] Date of Birth: Jun 21, 1955 Referring Provider (PT): Gildardo Pounds, NP   Encounter Date: 04/19/2018  PT End of Session - 04/19/18 1018    Visit Number  9    Number of Visits  13    Date for PT Re-Evaluation  05/03/18    Authorization Type  Self pay    PT Start Time  1018    PT Stop Time  1106    PT Time Calculation (min)  48 min    Activity Tolerance  Patient tolerated treatment well    Behavior During Therapy  [facility] for tasks assessed/performed       Past Medical History:  Diagnosis Date  . Allergy   . Anxiety   . Cataract   . Depression   . GERD (gastroesophageal reflux disease)   . Hypothyroidism   . Pancreatitis     Past Surgical History:  Procedure Laterality Date  . CHOLECYSTECTOMY    . ENDOSCOPIC RETROGRADE CHOLANGIOPANCREATOGRAPHY (ERCP) WITH PROPOFOL N/A 02/02/2017   Procedure: ENDOSCOPIC RETROGRADE CHOLANGIOPANCREATOGRAPHY (ERCP) WITH PROPOFOL;  Surgeon: Milus Banister, MD;  Location: [facility];  Service: Endoscopy;  Laterality: N/A;  . ERCP N/A 12/10/2016   Procedure: ENDOSCOPIC RETROGRADE CHOLANGIOPANCREATOGRAPHY (ERCP);  Surgeon: Carol Ada, MD;  Location: [facility];  Service: Endoscopy;  Laterality: N/A;  . ERCP N/A 12/14/2016   Procedure: ENDOSCOPIC RETROGRADE CHOLANGIOPANCREATOGRAPHY (ERCP);  Surgeon: Milus Banister, MD;  Location: [facility];  Service: Endoscopy;  Laterality: N/A;  . LAPAROSCOPIC CHOLECYSTECTOMY SINGLE SITE WITH INTRAOPERATIVE CHOLANGIOGRAM N/A 12/13/2016   Procedure: LAPAROSCOPIC CHOLECYSTECTOMY SINGLE SITE AND LIVER BIOPSY;  Surgeon: Michael Boston, MD;  Location: [facility];  Service: General;  Laterality: N/A;  . OVARY SURGERY      There were no vitals filed for this visit.  Subjective Assessment -  04/19/18 1019    Subjective  "I am in alot of pain today from doing yoga, and the garage sale"     Patient Stated Goals  to decrease pain, be able ot wlak the dog, improve posture    Currently in Pain?  Yes    Pain Score  3     Pain Location  Shoulder    Pain Orientation  Left    Pain Descriptors / Indicators  Aching;Sore    Pain Type  Chronic pain    Pain Onset  More than a month ago    Pain Frequency  Intermittent                       [facility] Adult PT Treatment/Exercise - 04/19/18 1022      Lumbar Exercises: Seated   Other Seated Lumbar Exercises  palloff press bil 1 x 20 with green theraband   seated on dynadisc   Other Seated Lumbar Exercises  anterior pelvic tilt 2 x 10 holding keeping core tight x 3 seconds      Shoulder Exercises: ROM/Strengthening   Nustep  L4 x 6 min UE/LE      Moist Heat Therapy   Number Minutes Moist Heat  10 Minutes    Moist Heat Location  Shoulder   in supine     Manual Therapy   Manual Therapy  Soft tissue mobilization    Soft tissue mobilization  IASTM along bil L upper trap, rhomboids  Other Manual Therapy  MTPR along along L upper trap      Neck Exercises: Stretches   Upper Trapezius Stretch  2 reps;30 seconds    Levator Stretch  30 seconds;2 reps             PT Education - 04/19/18 1056    Education Details  to perform light exercise when having increased pain and avoid not doing anything which can contribute to pain and stiffness.     Person(s) Educated  Patient    Methods  Explanation;Verbal cues    Comprehension  Verbalized understanding       PT Short Term Goals - 03/30/18 0751      PT SHORT TERM GOAL #1   Title  pt to be I with inital HEP    Status  Achieved      PT SHORT TERM GOAL #2   Title  pt to demo/ verbalize proper posture and lifting mechanics to reduce and prevent neck/ shoulder pain     Status  On-going      PT SHORT TERM GOAL #3   Title  increase L grip strength by >/= 10# to demo  improvement in shoulder function    Status  Unable to assess        PT Long Term Goals - 03/30/18 0751      PT LONG TERM GOAL #1   Title  increase L shoulder Abduction to >/= 110 degrees and maintain all other ROM measures with </=2/10 pain for functional ROM    Status  On-going      PT LONG TERM GOAL #2   Title  Increase L shoulder strength to >/= 4/5 to promote scapulohumeral rhythm and assist with shoulder stability     Status  On-going      PT LONG TERM GOAL #3   Title  pt to be able to lift/ carry from overhead shelf >/= 10# for funtional strength required for ADLs    Status  On-going      PT LONG TERM GOAL #4   Title  increase FOTO score to </= 44% limited to demo improvement in function     Status  On-going      PT LONG TERM GOAL #5   Title  pt to be I with all HEP given as of last visit to maintain and progress current level of function     Status  On-going            Plan - 04/19/18 1053    Clinical Impression Statement  pt reports increased soreness in the back today which she noted is from doing too much and is likely undergoing delayed onset muscle soreness. continued working on general shoulder mobility and core activation which she noted improvement of pain. utilized MHp end of session to calm down soreness at end of session.     PT Next Visit Plan  progress peri-scapular strengthening, and review lifting mechanics PRN. modalities PRN for pain    PT Home Exercise Plan  scapular retraction, upper trap stretch, levator scapulae stretch, book opening, chin tuck, standing rows and extension, bialteral ER with retraction red, horizontal abduction.  foam roll routine    Consulted and Agree with Plan of Care  Patient       Patient will benefit from skilled therapeutic intervention in order to improve the following deficits and impairments:  Pain, Impaired UE functional use, Increased fascial restricitons, Decreased strength, Postural dysfunction, Improper body  mechanics, Decreased range  of motion, Increased muscle spasms, Decreased endurance, Decreased balance, Decreased activity tolerance  Visit Diagnosis: Chronic left shoulder pain  Muscle weakness (generalized)  Other muscle spasm     Problem List Patient Active Problem List   Diagnosis Date Noted  . Encounter for removal of biliary stent   . Anxiety and depression 02/01/2017  . Bile duct leak   . Acute calculous cholecystitis 12/13/2016  . Jaundice 12/13/2016  . Obesity 12/13/2016  . Hypothyroidism   . Gallstone pancreatitis 12/10/2016   Starr Lake PT, DPT, LAT, ATC  04/19/18  10:58 AM      [facility] [address] [place], [place], [address] Phone: [phone]   Fax:  [phone]  Name: Andrea Blackwell MRN: [id number] Date of Birth: 05-25-1955

## 2018-04-20 ENCOUNTER — Encounter: Payer: Self-pay | Admitting: Physical Therapy

## 2018-04-25 ENCOUNTER — Ambulatory Visit: Payer: Self-pay | Admitting: Physical Therapy

## 2018-04-25 ENCOUNTER — Other Ambulatory Visit: Payer: Self-pay

## 2018-04-25 ENCOUNTER — Encounter: Payer: Self-pay | Admitting: Physical Therapy

## 2018-04-25 DIAGNOSIS — M62838 Other muscle spasm: Secondary | ICD-10-CM

## 2018-04-25 DIAGNOSIS — G8929 Other chronic pain: Secondary | ICD-10-CM

## 2018-04-25 DIAGNOSIS — M25512 Pain in left shoulder: Principal | ICD-10-CM

## 2018-04-25 DIAGNOSIS — M6281 Muscle weakness (generalized): Secondary | ICD-10-CM

## 2018-04-25 NOTE — Therapy (Signed)
[place], [place], [address] Phone: [phone]   Fax:  [phone]  Physical Therapy Treatment   Progress Note Reporting Period 2/13/20to 04/25/18  See note below for Objective Data and Assessment of Progress/Goals.      Patient Details  Name: Andrea Blackwell MRN: [id number] Date of Birth: Jan 18, 1956 Referring Provider (PT): Gildardo Pounds, NP   Encounter Date: 04/25/2018  PT End of Session - 04/25/18 1004    Visit Number  10    Number of Visits  13    Date for PT Re-Evaluation  05/03/18    Authorization Type  Self pay    PT Start Time  0937    PT Stop Time  1022    PT Time Calculation (min)  45 min    Activity Tolerance  Patient tolerated treatment well    Behavior During Therapy  [facility] for tasks assessed/performed       Past Medical History:  Diagnosis Date  . Allergy   . Anxiety   . Cataract   . Depression   . GERD (gastroesophageal reflux disease)   . Hypothyroidism   . Pancreatitis     Past Surgical History:  Procedure Laterality Date  . CHOLECYSTECTOMY    . ENDOSCOPIC RETROGRADE CHOLANGIOPANCREATOGRAPHY (ERCP) WITH PROPOFOL N/A 02/02/2017   Procedure: ENDOSCOPIC RETROGRADE CHOLANGIOPANCREATOGRAPHY (ERCP) WITH PROPOFOL;  Surgeon: Milus Banister, MD;  Location: [facility];  Service: Endoscopy;  Laterality: N/A;  . ERCP N/A 12/10/2016   Procedure: ENDOSCOPIC RETROGRADE CHOLANGIOPANCREATOGRAPHY (ERCP);  Surgeon: Carol Ada, MD;  Location: [facility];  Service: Endoscopy;  Laterality: N/A;  . ERCP N/A 12/14/2016   Procedure: ENDOSCOPIC RETROGRADE CHOLANGIOPANCREATOGRAPHY (ERCP);  Surgeon: Milus Banister, MD;  Location: [facility];  Service: Endoscopy;  Laterality: N/A;  . LAPAROSCOPIC CHOLECYSTECTOMY SINGLE SITE WITH INTRAOPERATIVE CHOLANGIOGRAM N/A 12/13/2016   Procedure: LAPAROSCOPIC CHOLECYSTECTOMY SINGLE SITE AND LIVER BIOPSY;  Surgeon: Michael Boston, MD;  Location: [facility];   Service: General;  Laterality: N/A;  . OVARY SURGERY      There were no vitals filed for this visit.  Subjective Assessment - 04/25/18 0938    Subjective  Its a 1/10.  Has been doing more, lifting small boxes . I can barely lift the coffee with my L hand when it is to the side.      Currently in Pain?  Yes    Pain Score  1     Pain Location  Shoulder    Pain Orientation  Left    Pain Descriptors / Indicators  Aching    Pain Type  Chronic pain    Pain Onset  More than a month ago    Pain Frequency  Intermittent    Aggravating Factors   lifting, reaching    Pain Relieving Factors  heating pad, massage           [facility] Adult PT Treatment/Exercise - 04/25/18 0001      Self-Care   Other Self-Care Comments   core, see pt ed.       Lumbar Exercises: Supine   Bridge  10 reps    Bridge Limitations  add in shoulder ext PT anchor from above/behind blue band     Advanced Lumbar Stabilization Limitations  bridge with horizontal pull blue x 10 , alternating flex/ext x 10     Other Supine Lumbar Exercises  bridge and clam bilat.  x 10       Shoulder Exercises: Supine   Horizontal  ABduction  Strengthening;Both;10 reps    Theraband Level (Shoulder Horizontal ABduction)  Level 4 (Blue)    External Rotation  Strengthening    Theraband Level (Shoulder External Rotation)  Level 4 (Blue)    Other Supine Exercises  scapular stability narrow grip x 10 blue 2 sets , 1 set with legs in table top for core     Other Supine Exercises  small circles for scapular stabilization       Shoulder Exercises: ROM/Strengthening   UBE (Upper Arm Bike)  6 min L2              PT Education - 04/25/18 1017    Education Details  HEP , core integration with UE exercises , stretch vs strengthen     Person(s) Educated  Patient    Methods  Explanation;Handout    Comprehension  Verbalized understanding;Verbal cues required;Returned demonstration       PT Short Term Goals - 03/30/18 0751      PT SHORT  TERM GOAL #1   Title  pt to be I with inital HEP    Status  Achieved      PT SHORT TERM GOAL #2   Title  pt to demo/ verbalize proper posture and lifting mechanics to reduce and prevent neck/ shoulder pain     Status  On-going      PT SHORT TERM GOAL #3   Title  increase L grip strength by >/= 10# to demo improvement in shoulder function    Status  Unable to assess        PT Long Term Goals - 03/30/18 0751      PT LONG TERM GOAL #1   Title  increase L shoulder Abduction to >/= 110 degrees and maintain all other ROM measures with </=2/10 pain for functional ROM    Status  On-going      PT LONG TERM GOAL #2   Title  Increase L shoulder strength to >/= 4/5 to promote scapulohumeral rhythm and assist with shoulder stability     Status  On-going      PT LONG TERM GOAL #3   Title  pt to be able to lift/ carry from overhead shelf >/= 10# for funtional strength required for ADLs    Status  On-going      PT LONG TERM GOAL #4   Title  increase FOTO score to </= 44% limited to demo improvement in function     Status  On-going      PT LONG TERM GOAL #5   Title  pt to be I with all HEP given as of last visit to maintain and progress current level of function     Status  On-going            Plan - 04/25/18 1026    Clinical Impression Statement  Pt was able to use UE as she worked on core exercises without increasing pain.  Declined modalities.      PT Treatment/Interventions  ADLs/Self Care Home Management;Cryotherapy;Electrical Stimulation;Iontophoresis 4mg /ml Dexamethasone;Moist Heat;Traction;Ultrasound;Therapeutic activities;Patient/family education;Therapeutic exercise;Dry needling;Passive range of motion;Taping;Manual techniques    PT Next Visit Plan  progress peri-scapular/core strengthening, and review lifting mechanics PRN. modalities PRN for pain    PT Home Exercise Plan  scapular retraction, upper trap stretch, levator scapulae stretch, book opening, chin tuck, standing  rows and extension, bialteral ER with retraction red, horizontal abduction.  foam roll routine    Consulted and Agree with Plan of Care  Patient  Patient will benefit from skilled therapeutic intervention in order to improve the following deficits and impairments:  Pain, Impaired UE functional use, Increased fascial restricitons, Decreased strength, Postural dysfunction, Improper body mechanics, Decreased range of motion, Increased muscle spasms, Decreased endurance, Decreased balance, Decreased activity tolerance  Visit Diagnosis: Chronic left shoulder pain  Muscle weakness (generalized)  Other muscle spasm     Problem List Patient Active Problem List   Diagnosis Date Noted  . Encounter for removal of biliary stent   . Anxiety and depression 02/01/2017  . Bile duct leak   . Acute calculous cholecystitis 12/13/2016  . Jaundice 12/13/2016  . Obesity 12/13/2016  . Hypothyroidism   . Gallstone pancreatitis 12/10/2016    Arlen Dupuis 04/25/2018, 10:28 AM  [facility] [address] [place], [place], [address] Phone: [phone]   Fax:  [phone]  Name: Andrea Blackwell MRN: [id number] Date of Birth: 1955/06/11  Raeford Razor, PT 04/25/18 10:29 AM Phone: [phone] Fax: [phone]

## 2018-04-25 NOTE — Patient Instructions (Signed)
    Over Head Pull: Narrow Grip       On back, knees bent, feet flat, band across thighs, elbows straight but relaxed. Pull hands apart (start). Keeping elbows straight, bring arms up and over head, hands toward floor. Keep pull steady on band. Hold momentarily. Return slowly, keeping pull steady, back to start. Repeat ___ times. Band color ______   Side Pull: Double Arm   On back, knees bent, feet flat. Arms perpendicular to body, shoulder level, elbows straight but relaxed. Pull arms out to sides, elbows straight. Resistance band comes across collarbones, hands toward floor. Hold momentarily. Slowly return to starting position. Repeat ___ times. Band color _____    Shoulder Rotation: Double Arm   On back, knees bent, feet flat, elbows tucked at sides, bent 90, hands palms up. Pull hands apart and down toward floor, keeping elbows near sides. Hold momentarily. Slowly return to starting position. Repeat ___ times. Band color ______

## 2018-04-27 ENCOUNTER — Encounter: Payer: Self-pay | Admitting: Physical Therapy

## 2018-04-30 ENCOUNTER — Ambulatory Visit: Payer: Self-pay | Admitting: Physical Therapy

## 2018-05-01 ENCOUNTER — Telehealth: Payer: Self-pay

## 2018-05-01 ENCOUNTER — Other Ambulatory Visit: Payer: Self-pay | Admitting: Nurse Practitioner

## 2018-05-01 DIAGNOSIS — S29019A Strain of muscle and tendon of unspecified wall of thorax, initial encounter: Secondary | ICD-10-CM

## 2018-05-01 DIAGNOSIS — F32A Depression, unspecified: Secondary | ICD-10-CM

## 2018-05-01 DIAGNOSIS — F419 Anxiety disorder, unspecified: Principal | ICD-10-CM

## 2018-05-01 DIAGNOSIS — M79602 Pain in left arm: Secondary | ICD-10-CM

## 2018-05-01 DIAGNOSIS — E039 Hypothyroidism, unspecified: Secondary | ICD-10-CM

## 2018-05-01 DIAGNOSIS — F329 Major depressive disorder, single episode, unspecified: Secondary | ICD-10-CM

## 2018-05-01 DIAGNOSIS — S161XXA Strain of muscle, fascia and tendon at neck level, initial encounter: Secondary | ICD-10-CM

## 2018-05-01 MED ORDER — LEVOTHYROXINE SODIUM 150 MCG PO TABS: 150.0000 ug | ORAL_TABLET | Freq: Every day | ORAL | 0 refills | Status: DC

## 2018-05-01 MED ORDER — DICLOFENAC SODIUM 1 % TD GEL: 2.0000 g | Freq: Four times a day (QID) | TRANSDERMAL | 1 refills | Status: AC

## 2018-05-01 MED ORDER — ESCITALOPRAM OXALATE 20 MG PO TABS: 20.0000 mg | ORAL_TABLET | Freq: Every day | ORAL | 1 refills | Status: DC

## 2018-05-01 MED ORDER — METHOCARBAMOL 750 MG PO TABS: 750.0000 mg | ORAL_TABLET | Freq: Three times a day (TID) | ORAL | 0 refills | Status: DC | PRN

## 2018-05-01 NOTE — Telephone Encounter (Signed)
Scripts have been sent. THank you

## 2018-05-01 NOTE — Telephone Encounter (Signed)
Patient is requesting for refills:  Voltaren Gel Lexapro Levothyroxine Robaxin   Pharmacy: [facility].

## 2018-05-02 ENCOUNTER — Ambulatory Visit: Payer: Self-pay | Admitting: Nurse Practitioner

## 2018-05-03 ENCOUNTER — Ambulatory Visit: Payer: Self-pay | Admitting: Physical Therapy

## 2018-05-03 MED FILL — LEVOTHYROXINE 150 MCG TAB: 150 | 90 days supply | Qty: 90 | Fill #0

## 2018-05-03 MED FILL — DICLOFENAC SODIUM 1% GEL: 1 | 12 days supply | Qty: 100 | Fill #0

## 2018-05-03 MED FILL — METHOCARBAMOL 750 MG TABS: 750 | 20 days supply | Qty: 60 | Fill #0

## 2018-05-03 MED FILL — ESCITALOPRAM 20 MG TABLET: 20 | 90 days supply | Qty: 90 | Fill #0

## 2018-05-07 ENCOUNTER — Ambulatory Visit: Payer: Self-pay | Admitting: Physical Therapy

## 2018-05-10 ENCOUNTER — Ambulatory Visit: Payer: Self-pay | Admitting: Physical Therapy

## 2018-05-13 ENCOUNTER — Encounter: Payer: Self-pay | Admitting: Nurse Practitioner

## 2018-05-14 NOTE — Telephone Encounter (Signed)
Patient Covid testing mychart concern

## 2018-06-08 ENCOUNTER — Other Ambulatory Visit: Payer: Self-pay | Admitting: Nurse Practitioner

## 2018-06-08 DIAGNOSIS — S161XXA Strain of muscle, fascia and tendon at neck level, initial encounter: Secondary | ICD-10-CM

## 2018-06-08 DIAGNOSIS — M79602 Pain in left arm: Secondary | ICD-10-CM

## 2018-06-08 DIAGNOSIS — S29019A Strain of muscle and tendon of unspecified wall of thorax, initial encounter: Secondary | ICD-10-CM

## 2018-06-08 DIAGNOSIS — E039 Hypothyroidism, unspecified: Secondary | ICD-10-CM

## 2018-06-08 MED FILL — DICLOFENAC SODIUM 1% GEL: 1 | 12 days supply | Qty: 100 | Fill #1

## 2018-06-08 MED FILL — ESCITALOPRAM 20 MG TABLET: 20 | 90 days supply | Qty: 90 | Fill #1

## 2018-06-11 MED FILL — METHOCARBAMOL 750 MG TABS: 750 | 14 days supply | Qty: 42 | Fill #0

## 2018-06-11 MED FILL — LEVOTHYROXINE 150 MCG TAB: 150 | 30 days supply | Qty: 30 | Fill #0

## 2018-06-12 ENCOUNTER — Ambulatory Visit: Payer: Self-pay | Attending: Nurse Practitioner | Admitting: Physical Therapy

## 2018-06-12 ENCOUNTER — Other Ambulatory Visit: Payer: Self-pay

## 2018-06-12 DIAGNOSIS — M62838 Other muscle spasm: Secondary | ICD-10-CM | POA: Insufficient documentation

## 2018-06-12 DIAGNOSIS — M25512 Pain in left shoulder: Secondary | ICD-10-CM | POA: Insufficient documentation

## 2018-06-12 DIAGNOSIS — G8929 Other chronic pain: Secondary | ICD-10-CM | POA: Insufficient documentation

## 2018-06-12 DIAGNOSIS — M6281 Muscle weakness (generalized): Secondary | ICD-10-CM | POA: Insufficient documentation

## 2018-06-12 NOTE — Therapy (Signed)
[place], [place], [address] Phone: [phone]   Fax:  [phone]  Physical Therapy Treatment/Renewal  Patient Details  Name: Andrea Blackwell MRN: [id number] Date of Birth: [birth year] Referring Provider (PT): Gildardo Pounds, NP   Encounter Date: 06/12/2018  PT End of Session - 06/12/18 1507    Visit Number  11    Number of Visits  13    Date for PT Re-Evaluation  07/10/18    Authorization Type  Self pay    PT Start Time  1500    PT Stop Time  1545    PT Time Calculation (min)  45 min    Activity Tolerance  Patient tolerated treatment well    Behavior During Therapy  [facility] for tasks assessed/performed       Past Medical History:  Diagnosis Date  . Allergy   . Anxiety   . Cataract   . Depression   . GERD (gastroesophageal reflux disease)   . Hypothyroidism   . Pancreatitis     Past Surgical History:  Procedure Laterality Date  . CHOLECYSTECTOMY    . ENDOSCOPIC RETROGRADE CHOLANGIOPANCREATOGRAPHY (ERCP) WITH PROPOFOL N/A 02/02/2017   Procedure: ENDOSCOPIC RETROGRADE CHOLANGIOPANCREATOGRAPHY (ERCP) WITH PROPOFOL;  Surgeon: Milus Banister, MD;  Location: [facility];  Service: Endoscopy;  Laterality: N/A;  . ERCP N/A 12/10/2016   Procedure: ENDOSCOPIC RETROGRADE CHOLANGIOPANCREATOGRAPHY (ERCP);  Surgeon: Carol Ada, MD;  Location: [facility];  Service: Endoscopy;  Laterality: N/A;  . ERCP N/A 12/14/2016   Procedure: ENDOSCOPIC RETROGRADE CHOLANGIOPANCREATOGRAPHY (ERCP);  Surgeon: Milus Banister, MD;  Location: [facility];  Service: Endoscopy;  Laterality: N/A;  . LAPAROSCOPIC CHOLECYSTECTOMY SINGLE SITE WITH INTRAOPERATIVE CHOLANGIOGRAM N/A 12/13/2016   Procedure: LAPAROSCOPIC CHOLECYSTECTOMY SINGLE SITE AND LIVER BIOPSY;  Surgeon: Michael Boston, MD;  Location: [facility];  Service: General;  Laterality: N/A;  . OVARY SURGERY      There were no vitals filed for this visit.  Subjective  Assessment - 06/12/18 1504    Subjective  I have fallen off of doing the exercises.  Gets a burning in shoulders, can be 3/10.  During the day 1/10.  Getting some pain cream.      Currently in Pain?  Yes    Pain Score  1     Pain Location  Shoulder    Pain Orientation  Right;Proximal;Lateral    Pain Descriptors / Indicators  Burning;Aching    Pain Type  Chronic pain    Pain Onset  More than a month ago    Pain Frequency  Intermittent    Aggravating Factors   AM stiff    Pain Relieving Factors  Voltaren, exercises         [facility] PT Assessment - 06/12/18 0001      AROM   Left Shoulder Extension  60 Degrees    Left Shoulder Flexion  140 Degrees    Left Shoulder Internal Rotation  --   WNL FR   Left Shoulder External Rotation  --   FR to T2     Strength   Right Shoulder Flexion  4+/5    Right Shoulder ABduction  4+/5    Left Shoulder Flexion  4/5    Left Shoulder ABduction  4/5    Left Shoulder Internal Rotation  4+/5    Left Shoulder External Rotation  4+/5            [facility] Adult PT Treatment/Exercise - 06/12/18 0001  Lumbar Exercises: Supine   Bridge  10 reps added overhead pull down for increased core     Shoulder Exercises: Supine   Horizontal ABduction  Strengthening;Both;15 reps    Theraband Level (Shoulder Horizontal ABduction)  Level 2 (Red);Level 3 (Green)    External Rotation  Strengthening;Both;15 reps    Theraband Level (Shoulder External Rotation)  Level 2 (Red);Level 3 (Green)    Flexion  Strengthening;Both;10 reps    Theraband Level (Shoulder Flexion)  Level 2 (Red);Level 3 (Green)    Other Supine Exercises  Supine on rolled towel for chest opening , snow angels and open/close horiz abd/add x 10       Shoulder Exercises: ROM/Strengthening   UBE (Upper Arm Bike)  L1 , 4 min (2 min each direction) harder to go in reverse     Other ROM/Strengthening Exercises  corner stretch x 30 sec                PT Short Term Goals - 06/12/18 1508       PT SHORT TERM GOAL #1   Title  pt to be I with inital HEP      PT SHORT TERM GOAL #2   Title  pt to demo/ verbalize proper posture and lifting mechanics to reduce and prevent neck/ shoulder pain     Status  Achieved      PT SHORT TERM GOAL #3   Title  increase L grip strength by >/= 10# to demo improvement in shoulder function        PT Long Term Goals - 06/12/18 1508      PT LONG TERM GOAL #1   Title  increase L shoulder Abduction to >/= 110 degrees and maintain all other ROM measures with </=2/10 pain for functional ROM      PT LONG TERM GOAL #2   Title  Increase L shoulder strength to >/= 4/5 to promote scapulohumeral rhythm and assist with shoulder stability       PT LONG TERM GOAL #4   Title  increase FOTO score to </= 44% limited to demo improvement in function       PT LONG TERM GOAL #5   Title  pt to be I with all HEP given as of last visit to maintain and progress current level of function             Plan - 06/12/18 1543    Clinical Impression Statement  Patient seen for renewal today.  She has overall less pain and improved strength.  She still has moderate posterior chain weakness and poor postural habits.  She has been reissued her HEP as she has not done it consistently.  She would like to resume PT 1 x per week to get back on track and impact her function.     PT Frequency  1x / week    PT Duration  4 weeks    PT Treatment/Interventions  ADLs/Self Care Home Management;Cryotherapy;Electrical Stimulation;Iontophoresis 4mg /ml Dexamethasone;Moist Heat;Traction;Ultrasound;Therapeutic activities;Patient/family education;Therapeutic exercise;Dry needling;Passive range of motion;Taping;Manual techniques    PT Next Visit Plan  progress peri-scapular/core strengthening    PT Home Exercise Plan  scapular retraction, upper trap stretch, levator scapulae stretch, book opening, chin tuck, standing rows and extension, supine scapular stabilization,   foam roll routine     Consulted and Agree with Plan of Care  Patient       Patient will benefit from skilled therapeutic intervention in order to improve the following deficits and impairments:  Pain, Impaired UE functional use, Increased fascial restricitons, Decreased strength, Postural dysfunction, Improper body mechanics, Decreased range of motion, Increased muscle spasms, Decreased endurance, Decreased balance, Decreased activity tolerance  Visit Diagnosis: Chronic left shoulder pain  Muscle weakness (generalized)  Other muscle spasm     Problem List Patient Active Problem List   Diagnosis Date Noted  . Encounter for removal of biliary stent   . Anxiety and depression 02/01/2017  . Bile duct leak   . Acute calculous cholecystitis 12/13/2016  . Jaundice 12/13/2016  . Obesity 12/13/2016  . Hypothyroidism   . Gallstone pancreatitis 12/10/2016    PAA,JENNIFER 06/12/2018, 3:54 PM  [facility] [address] [place], [place], [address] Phone: [phone]   Fax:  [phone]  Name: Patrycja Mumpower MRN: [id number] Date of Birth: [birth year]

## 2018-06-19 ENCOUNTER — Encounter: Payer: Self-pay | Admitting: Physical Therapy

## 2018-06-19 ENCOUNTER — Ambulatory Visit: Payer: Self-pay | Admitting: Physical Therapy

## 2018-06-19 ENCOUNTER — Other Ambulatory Visit: Payer: Self-pay

## 2018-06-19 DIAGNOSIS — M62838 Other muscle spasm: Secondary | ICD-10-CM

## 2018-06-19 DIAGNOSIS — M6281 Muscle weakness (generalized): Secondary | ICD-10-CM

## 2018-06-19 DIAGNOSIS — G8929 Other chronic pain: Secondary | ICD-10-CM

## 2018-06-19 NOTE — Patient Instructions (Signed)
Access Code: [email or web address]  URL: [email or web address]  Date: 06/19/2018  Prepared by: Raeford Razor   Exercises  Deadlift with Resistance - 10 reps - 2 sets - 1x daily - 7x weekly

## 2018-06-19 NOTE — Therapy (Signed)
Andrea Black[place], [place], [address] Phone: [phone]   Fax:  [phone]  Physical Therapy Treatment  Patient Details  Name: Andrea Blackwell MRN: [id number] Date of Birth: 1955/02/21 Referring Provider (PT): Gildardo Pounds, NP   Encounter Date: 06/19/2018  PT End of Session - 06/19/18 1018    Visit Number  12    Number of Visits  15    Date for PT Re-Evaluation  07/10/18    Authorization Type  Self pay    PT Start Time  1010    PT Stop Time  1058    PT Time Calculation (min)  48 min    Activity Tolerance  Patient tolerated treatment well    Behavior During Therapy  [facility] for tasks assessed/performed       Past Medical History:  Diagnosis Date  . Allergy   . Anxiety   . Cataract   . Depression   . GERD (gastroesophageal reflux disease)   . Hypothyroidism   . Pancreatitis     Past Surgical History:  Procedure Laterality Date  . CHOLECYSTECTOMY    . ENDOSCOPIC RETROGRADE CHOLANGIOPANCREATOGRAPHY (ERCP) WITH PROPOFOL N/A 02/02/2017   Procedure: ENDOSCOPIC RETROGRADE CHOLANGIOPANCREATOGRAPHY (ERCP) WITH PROPOFOL;  Surgeon: Milus Banister, MD;  Location: [facility];  Service: Endoscopy;  Laterality: N/A;  . ERCP N/A 12/10/2016   Procedure: ENDOSCOPIC RETROGRADE CHOLANGIOPANCREATOGRAPHY (ERCP);  Surgeon: Carol Ada, MD;  Location: [facility];  Service: Endoscopy;  Laterality: N/A;  . ERCP N/A 12/14/2016   Procedure: ENDOSCOPIC RETROGRADE CHOLANGIOPANCREATOGRAPHY (ERCP);  Surgeon: Milus Banister, MD;  Location: [facility];  Service: Endoscopy;  Laterality: N/A;  . LAPAROSCOPIC CHOLECYSTECTOMY SINGLE SITE WITH INTRAOPERATIVE CHOLANGIOGRAM N/A 12/13/2016   Procedure: LAPAROSCOPIC CHOLECYSTECTOMY SINGLE SITE AND LIVER BIOPSY;  Surgeon: Michael Boston, MD;  Location: [facility];  Service: General;  Laterality: N/A;  . OVARY SURGERY      There were no vitals filed for this visit.  Subjective Assessment -  06/19/18 1008    Subjective  Have been moving furniture, rearranging.  I have been really sore.  Today, i'm a 1/10. some in elbow     Currently in Pain?  Yes    Pain Score  1     Pain Location  Shoulder    Pain Orientation  Right;Proximal;Lateral    Pain Descriptors / Indicators  Sore    Pain Type  Chronic pain    Pain Onset  More than a month ago    Pain Frequency  Intermittent    Aggravating Factors   overactivity     Pain Relieving Factors  rest, pain cream          [facility] Adult PT Treatment/Exercise - 06/19/18 0001      Self-Care   Other Self-Care Comments   hip hinge , lifting progression, used dowel on back then in front, then 10 lb KB.  Multiple reps to get form and technique.        Neck Exercises: Theraband   Horizontal ABduction  15 reps;Green    Other Theraband Exercises  diagonal pull green x 10, used ball under hips to increase core activity       Lumbar Exercises: Supine   Bridge with Ball Squeeze  10 reps    Basic Lumbar Stabilization Limitations  clam, march x 10 each     Straight Leg Raise  10 reps    Straight Leg Raises Limitations  added UE for dead bug x 10  Shoulder Exercises: Standing   Horizontal ABduction  Strengthening;Both;10 reps    Theraband Level (Shoulder Horizontal ABduction)  Level 2 (Red)    External Rotation  Strengthening;Both;10 reps    Theraband Level (Shoulder External Rotation)  Level 2 (Red)    External Rotation Weight (lbs)  combined abd and ER     Extension  Strengthening;Both;10 reps    Theraband Level (Shoulder Extension)  Level 3 (Green)    Row  Strengthening;Both;15 reps    Theraband Level (Shoulder Row)  Level 3 (Green)             PT Education - 06/19/18 1017    Education Details  core with UE , dead lift and triceps     Person(s) Educated  Patient    Methods  Explanation    Comprehension  Verbalized understanding       PT Short Term Goals - 06/12/18 1508      PT SHORT TERM GOAL #1   Title  pt to be I with  inital HEP    Status  Achieved      PT SHORT TERM GOAL #2   Title  pt to demo/ verbalize proper posture and lifting mechanics to reduce and prevent neck/ shoulder pain     Status  Achieved      PT SHORT TERM GOAL #3   Title  increase L grip strength by >/= 10# to demo improvement in shoulder function    Status  Unable to assess        PT Long Term Goals - 06/12/18 1508      PT LONG TERM GOAL #1   Title  increase L shoulder Abduction to >/= 110 degrees and maintain all other ROM measures with </=2/10 pain for functional ROM    Status  Achieved      PT LONG TERM GOAL #2   Title  Increase L shoulder strength to >/= 4/5 to promote scapulohumeral rhythm and assist with shoulder stability     Status  On-going      PT LONG TERM GOAL #3   Title  pt to be able to lift/ carry from overhead shelf >/= 10# for funtional strength required for ADLs    Status  Unable to assess      PT LONG TERM GOAL #4   Title  increase FOTO score to </= 44% limited to demo improvement in function     Status  Unable to assess      PT LONG TERM GOAL #5   Title  pt to be I with all HEP given as of last visit to maintain and progress current level of function     Status  On-going            Plan - 06/19/18 1027    Clinical Impression Statement  Focus on strengthening today, including core and posterior chain.  She needs tactile and verbal cues to get optimal form.  No increased pain today.  Pt reports being able to do mroe around her home and is making room to move her [employer] into a main area of her home.     PT Treatment/Interventions  ADLs/Self Care Home Management;Cryotherapy;Electrical Stimulation;Iontophoresis 4mg /ml Dexamethasone;Moist Heat;Traction;Ultrasound;Therapeutic activities;Patient/family education;Therapeutic exercise;Dry needling;Passive range of motion;Taping;Manual techniques    PT Next Visit Plan  progress peri-scapular/core strengthening/post chain, lift/hinge     PT Home Exercise  Plan  scapular retraction, upper trap stretch, levator scapulae stretch, book opening, chin tuck, standing rows and extension, supine  scapular stabilization,   foam roll routine    Consulted and Agree with Plan of Care  Patient       Patient will benefit from skilled therapeutic intervention in order to improve the following deficits and impairments:  Pain, Impaired UE functional use, Increased fascial restricitons, Decreased strength, Postural dysfunction, Improper body mechanics, Decreased range of motion, Increased muscle spasms, Decreased endurance, Decreased balance, Decreased activity tolerance  Visit Diagnosis: Chronic left shoulder pain  Muscle weakness (generalized)  Other muscle spasm     Problem List Patient Active Problem List   Diagnosis Date Noted  . Encounter for removal of biliary stent   . Anxiety and depression 02/01/2017  . Bile duct leak   . Acute calculous cholecystitis 12/13/2016  . Jaundice 12/13/2016  . Obesity 12/13/2016  . Hypothyroidism   . Gallstone pancreatitis 12/10/2016    , 06/19/2018, 11:10 AM  [place], [place], [address] Phone: [phone]   Fax:  [phone]  Name: Arlisa Leclere MRN: [id number] Date of Birth: [birth year]  Raeford Razor, PT 06/19/18 11:10 AM Phone: [phone] Fax: [phone]

## 2018-06-26 ENCOUNTER — Encounter: Payer: Self-pay | Admitting: Physical Therapy

## 2018-06-26 ENCOUNTER — Ambulatory Visit: Payer: Self-pay | Admitting: Physical Therapy

## 2018-06-26 ENCOUNTER — Other Ambulatory Visit: Payer: Self-pay

## 2018-06-26 DIAGNOSIS — M62838 Other muscle spasm: Secondary | ICD-10-CM

## 2018-06-26 DIAGNOSIS — M6281 Muscle weakness (generalized): Secondary | ICD-10-CM

## 2018-06-26 DIAGNOSIS — G8929 Other chronic pain: Secondary | ICD-10-CM

## 2018-06-26 DIAGNOSIS — M25512 Pain in left shoulder: Secondary | ICD-10-CM

## 2018-06-26 NOTE — Therapy (Signed)
[place], [place], [address] Phone: [phone]   Fax:  [phone]  Physical Therapy Treatment  Patient Details  Name: Andrea Blackwell MRN: [id number] Date of Birth: [birth year] Referring Provider (PT): Gildardo Pounds, NP   Encounter Date: 06/26/2018  PT End of Session - 06/26/18 1006    Visit Number  13    Number of Visits  15    Date for PT Re-Evaluation  07/10/18    Authorization Type  Self pay    PT Start Time  1005    PT Stop Time  1055    PT Time Calculation (min)  50 min    Activity Tolerance  Patient tolerated treatment well    Behavior During Therapy  [facility] for tasks assessed/performed       Past Medical History:  Diagnosis Date  . Allergy   . Anxiety   . Cataract   . Depression   . GERD (gastroesophageal reflux disease)   . Hypothyroidism   . Pancreatitis     Past Surgical History:  Procedure Laterality Date  . CHOLECYSTECTOMY    . ENDOSCOPIC RETROGRADE CHOLANGIOPANCREATOGRAPHY (ERCP) WITH PROPOFOL N/A 02/02/2017   Procedure: ENDOSCOPIC RETROGRADE CHOLANGIOPANCREATOGRAPHY (ERCP) WITH PROPOFOL;  Surgeon: Milus Banister, MD;  [facility]: [facility];  Service: Endoscopy;  Laterality: N/A;  . ERCP N/A 12/10/2016   Procedure: ENDOSCOPIC RETROGRADE CHOLANGIOPANCREATOGRAPHY (ERCP);  Surgeon: Carol Ada, MD;  Location: [facility];  Service: Endoscopy;  Laterality: N/A;  . ERCP N/A 12/14/2016   Procedure: ENDOSCOPIC RETROGRADE CHOLANGIOPANCREATOGRAPHY (ERCP);  Surgeon: Milus Banister, MD;  Location: [facility];  Service: Endoscopy;  Laterality: N/A;  . LAPAROSCOPIC CHOLECYSTECTOMY SINGLE SITE WITH INTRAOPERATIVE CHOLANGIOGRAM N/A 12/13/2016   Procedure: LAPAROSCOPIC CHOLECYSTECTOMY SINGLE SITE AND LIVER BIOPSY;  Surgeon: Michael Boston, MD;  [facility]: [facility];  Service: General;  Laterality: N/A;  . OVARY SURGERY      There were no vitals filed for this visit.  Subjective Assessment -  06/26/18 1005    Subjective  All my joints are hurting.  My neck and shoulder are bothering me.  Did alot of gardening.  The weather?     Currently in Pain?  Yes         [facility] PT Assessment - 06/26/18 0001      AROM   Left Shoulder Flexion  130 Degrees    Left Shoulder ABduction  140 Degrees      Strength   Right Shoulder Flexion  5/5    Right Shoulder ABduction  4+/5    Left Shoulder Flexion  4/5    Left Shoulder ABduction  4/5    Right Hand Grip (lbs)  42   40, 42, 43   Left Hand Grip (lbs)  43   45, 43, 40         [facility] Adult PT Treatment/Exercise - 06/26/18 0001      Shoulder Exercises: Supine   Other Supine Exercises  triceps press then skull crusher x 5 lbs LUE     Other Supine Exercises  lat pull over 5 lbs x 15       Shoulder Exercises: Sidelying   External Rotation  Strengthening;Left;15 reps    External Rotation Weight (lbs)  3    Flexion  Strengthening;Left;10 reps    Flexion Weight (lbs)  2    ABduction  Strengthening;Left;10 reps    ABduction Weight (lbs)  2    Other Sidelying Exercises  --    Other  Sidelying Exercises  --      Shoulder Exercises: Standing   Horizontal ABduction  Strengthening;Both;10 reps    Horizontal ABduction Weight (lbs)  3    Flexion  Strengthening;Both;10 reps    Shoulder Flexion Weight (lbs)  3    ABduction  Strengthening;Both;10 reps    Shoulder ABduction Weight (lbs)  3    Row  Strengthening;20 reps;Weights    Row Weight (lbs)  5   1 arm standing    Other Standing Exercises  reverse fly x 10, 2 lbs     Other Standing Exercises  forward/lateral raise combo x 10, 3 lbs       Shoulder Exercises: ROM/Strengthening   UBE (Upper Arm Bike)  L1 5 min, forward    Modified Plank Limitations  plank on high surface , shoulder taps , leg lifts x 10      Other ROM/Strengthening Exercises  functional lifting, 2 lbs multilevel x 10 LLE       Manual Therapy   Passive ROM  L UE all planes , less pain with end range flexion if  inferior glide of humerus    Other Manual Therapy  inf glide brief, 1 min                PT Short Term Goals - 06/26/18 1008      PT SHORT TERM GOAL #1   Title  pt to be I with inital HEP    Status  Achieved      PT SHORT TERM GOAL #2   Title  pt to demo/ verbalize proper posture and lifting mechanics to reduce and prevent neck/ shoulder pain     Status  Achieved      PT SHORT TERM GOAL #3   Title  increase L grip strength by >/= 10# to demo improvement in shoulder function    Baseline  symmetrical    Status  Partially Met        PT Long Term Goals - 06/26/18 1048      PT LONG TERM GOAL #1   Title  increase L shoulder Abduction to >/= 110 degrees and maintain all other ROM measures with </=2/10 pain for functional ROM    Status  Achieved      PT LONG TERM GOAL #2   Title  Increase L shoulder strength to >/= 4/5 to promote scapulohumeral rhythm and assist with shoulder stability     Status  Achieved      PT LONG TERM GOAL #3   Title  pt to be able to lift/ carry from overhead shelf >/= 10# for funtional strength required for ADLs    Status  Partially Met      PT LONG TERM GOAL #4   Title  increase FOTO score to </= 44% limited to demo improvement in function     Status  Unable to assess      PT LONG TERM GOAL #5   Title  pt to be I with all HEP given as of last visit to maintain and progress current level of function     Status  On-going            Plan - 06/26/18 1006    Clinical Impression Statement  Pt with increased Rt shoulder strength, L UE maintained as well as AROM.  FOcused on general strengthening for UE and shoulder girdle. Nearing end of POC    PT Treatment/Interventions  ADLs/Self Care Home Management;Cryotherapy;Electrical Stimulation;Iontophoresis 65m/ml Dexamethasone;Moist  Heat;Traction;Ultrasound;Therapeutic activities;Patient/family education;Therapeutic exercise;Dry needling;Passive range of motion;Taping;Manual techniques    PT Next  Visit Plan  progress peri-scapular/core strengthening/post chain, lift/hinge     PT Home Exercise Plan  scapular retraction, upper trap stretch, levator scapulae stretch, book opening, chin tuck, standing rows and extension, supine scapular stabilization,   foam roll routine    Consulted and Agree with Plan of Care  Patient       Patient will benefit from skilled therapeutic intervention in order to improve the following deficits and impairments:  Pain, Impaired UE functional use, Increased fascial restricitons, Decreased strength, Postural dysfunction, Improper body mechanics, Decreased range of motion, Increased muscle spasms, Decreased endurance, Decreased balance, Decreased activity tolerance  Visit Diagnosis: Chronic left shoulder pain  Muscle weakness (generalized)  Other muscle spasm     Problem List Patient Active Problem List   Diagnosis Date Noted  . Encounter for removal of biliary stent   . Anxiety and depression 02/01/2017  . Bile duct leak   . Acute calculous cholecystitis 12/13/2016  . Jaundice 12/13/2016  . Obesity 12/13/2016  . Hypothyroidism   . Gallstone pancreatitis 12/10/2016    Glorimar Stroope 06/26/2018, 11:03 AM  [facility] [address] [place], [place], [address] Phone: [phone]   Fax:  [phone]  Name: Rickia Freeburg MRN: [id number] Date of Birth: 1955/07/21  Raeford Razor, PT 06/26/18 11:03 AM Phone: [phone] Fax: [phone]

## 2018-07-03 ENCOUNTER — Ambulatory Visit: Payer: Self-pay | Admitting: Physical Therapy

## 2018-07-03 ENCOUNTER — Other Ambulatory Visit: Payer: Self-pay

## 2018-07-03 ENCOUNTER — Encounter: Payer: Self-pay | Admitting: Physical Therapy

## 2018-07-03 DIAGNOSIS — M62838 Other muscle spasm: Secondary | ICD-10-CM

## 2018-07-03 DIAGNOSIS — M6281 Muscle weakness (generalized): Secondary | ICD-10-CM

## 2018-07-03 DIAGNOSIS — G8929 Other chronic pain: Secondary | ICD-10-CM

## 2018-07-03 NOTE — Therapy (Signed)
[place], [place], [address] Phone: [phone]   Fax:  [phone]  Physical Therapy Treatment/Discharge   Patient Details  Name: Andrea Blackwell MRN: [id number] Date of Birth: 1955/11/07 Referring Provider (PT): Gildardo Pounds, NP   Encounter Date: 07/03/2018  PT End of Session - 07/03/18 1010    Visit Number  14    Number of Visits  15    Date for PT Re-Evaluation  07/10/18    Authorization Type  Self pay    PT Start Time  1009    PT Stop Time  1040    PT Time Calculation (min)  31 min    Activity Tolerance  Patient tolerated treatment well    Behavior During Therapy  [facility] for tasks assessed/performed       Past Medical History:  Diagnosis Date  . Allergy   . Anxiety   . Cataract   . Depression   . GERD (gastroesophageal reflux disease)   . Hypothyroidism   . Pancreatitis     Past Surgical History:  Procedure Laterality Date  . CHOLECYSTECTOMY    . ENDOSCOPIC RETROGRADE CHOLANGIOPANCREATOGRAPHY (ERCP) WITH PROPOFOL N/A 02/02/2017   Procedure: ENDOSCOPIC RETROGRADE CHOLANGIOPANCREATOGRAPHY (ERCP) WITH PROPOFOL;  Surgeon: Milus Banister, MD;  Location: [facility];  Service: Endoscopy;  Laterality: N/A;  . ERCP N/A 12/10/2016   Procedure: ENDOSCOPIC RETROGRADE CHOLANGIOPANCREATOGRAPHY (ERCP);  Surgeon: Carol Ada, MD;  Location: [facility];  Service: Endoscopy;  Laterality: N/A;  . ERCP N/A 12/14/2016   Procedure: ENDOSCOPIC RETROGRADE CHOLANGIOPANCREATOGRAPHY (ERCP);  Surgeon: Milus Banister, MD;  Location: [facility];  Service: Endoscopy;  Laterality: N/A;  . LAPAROSCOPIC CHOLECYSTECTOMY SINGLE SITE WITH INTRAOPERATIVE CHOLANGIOGRAM N/A 12/13/2016   Procedure: LAPAROSCOPIC CHOLECYSTECTOMY SINGLE SITE AND LIVER BIOPSY;  Surgeon: Michael Boston, MD;  Location: [facility];  Service: General;  Laterality: N/A;  . OVARY SURGERY      There were no vitals filed for this visit.  Subjective  Assessment - 07/03/18 1040    Subjective  NO complaints today.  Ready for DC     Currently in Pain?  Yes    Pain Score  1     Pain Location  Shoulder    Pain Orientation  Left    Pain Descriptors / Indicators  Sore    Pain Type  Chronic pain    Pain Onset  More than a month ago    Pain Frequency  Intermittent    Aggravating Factors   reaching out     Pain Relieving Factors  rest          [facility] PT Assessment - 07/03/18 0001      Observation/Other Assessments   Focus on Therapeutic Outcomes (FOTO)   31%      AROM   Right Shoulder Flexion  144 Degrees    Left Shoulder Flexion  128 Degrees    Left Shoulder ABduction  135 Degrees    Left Shoulder Internal Rotation  --   L better than R functional reach    Left Shoulder External Rotation  --   R/L equal      Strength   Left Shoulder Flexion  4/5    Left Shoulder ABduction  4/5                   [facility] Adult PT Treatment/Exercise - 07/03/18 0001      Shoulder Exercises: Standing   Horizontal ABduction  Strengthening;Both;10 reps    Theraband  Level (Shoulder Horizontal ABduction)  Level 3 (Green)    Horizontal ABduction Weight (lbs)  attached     External Rotation  Strengthening;Both;10 reps    Theraband Level (Shoulder External Rotation)  Level 3 (Green)    External Rotation Weight (lbs)  attached     Extension  Strengthening;Both;20 reps    Theraband Level (Shoulder Extension)  Level 4 (Blue)    Extension Weight (lbs)  some with black     Row  Strengthening;Both;20 reps    Theraband Level (Shoulder Row)  Level 4 (Blue)    Row Weight (lbs)  black few reps       Shoulder Exercises: ROM/Strengthening   UBE (Upper Arm Bike)  6 min L1 , 3 min each direction       Neck Exercises: Stretches   Upper Trapezius Stretch  Right;Left;3 reps    Levator Stretch  Right;Left;3 reps             PT Education - 07/03/18 1041    Education Details  DC, form with HEP, lifting and shouldler setting    Person(s)  Educated  Patient    Methods  Explanation    Comprehension  Verbalized understanding;Returned demonstration       PT Short Term Goals - 07/03/18 1048      PT SHORT TERM GOAL #1   Title  pt to be I with inital HEP    Status  Achieved      PT SHORT TERM GOAL #2   Title  pt to demo/ verbalize proper posture and lifting mechanics to reduce and prevent neck/ shoulder pain     Status  Achieved      PT SHORT TERM GOAL #3   Title  increase L grip strength by >/= 10# to demo improvement in shoulder function    Baseline  symmetrical    Status  Partially Met        PT Long Term Goals - 07/03/18 1048      PT LONG TERM GOAL #1   Title  increase L shoulder Abduction to >/= 110 degrees and maintain all other ROM measures with </=2/10 pain for functional ROM    Baseline  pain can be >2/10 if repetitive    Status  Achieved      PT LONG TERM GOAL #2   Title  Increase L shoulder strength to >/= 4/5 to promote scapulohumeral rhythm and assist with shoulder stability     Status  Achieved      PT LONG TERM GOAL #3   Title  pt to be able to lift/ carry from overhead shelf >/= 10# for funtional strength required for ADLs    Status  Achieved      PT LONG TERM GOAL #4   Title  increase FOTO score to </= 44% limited to demo improvement in function     Status  Achieved      PT LONG TERM GOAL #5   Title  pt to be I with all HEP given as of last visit to maintain and progress current level of function     Status  Achieved            Plan - 07/03/18 1014    Clinical Impression Statement  Patient with very min limitation in L shoulder function.  She improved 40% on FOTO.  Knows HEP and is motivated to continue to improve.     PT Treatment/Interventions  ADLs/Self Care Home Management;Cryotherapy;Electrical Stimulation;Iontophoresis 59m/ml Dexamethasone;Moist Heat;Traction;Ultrasound;Therapeutic  activities;Patient/family education;Therapeutic exercise;Dry needling;Passive range of  motion;Taping;Manual techniques    PT Next Visit Plan  NA, DC     PT Home Exercise Plan  scapular retraction, upper trap stretch, levator scapulae stretch, book opening, chin tuck, standing rows and extension, supine scapular stabilization,   foam roll routine    Consulted and Agree with Plan of Care  Patient       Patient will benefit from skilled therapeutic intervention in order to improve the following deficits and impairments:  Pain, Impaired UE functional use, Increased fascial restricitons, Decreased strength, Postural dysfunction, Improper body mechanics, Decreased range of motion, Increased muscle spasms, Decreased endurance, Decreased balance, Decreased activity tolerance  Visit Diagnosis: Chronic left shoulder pain  Muscle weakness (generalized)  Other muscle spasm     Problem List Patient Active Problem List   Diagnosis Date Noted  . Encounter for removal of biliary stent   . Anxiety and depression 02/01/2017  . Bile duct leak   . Acute calculous cholecystitis 12/13/2016  . Jaundice 12/13/2016  . Obesity 12/13/2016  . Hypothyroidism   . Gallstone pancreatitis 12/10/2016    Bronda Alfred 07/03/2018, 10:51 AM  [facility] [address] [place], [place], [address] Phone: [phone]   Fax:  [phone]  Name: Salwa Bai MRN: [id number] Date of Birth: [birth year]  PHYSICAL THERAPY DISCHARGE SUMMARY  Visits from Start of Care: 14  Current functional level related to goals / functional outcomes: See above for FOTO score    Remaining deficits: Strength 4/5, pain with abduction , eccentric    Education / Equipment: HEP, lifting, posture, core , RICE   Plan: Patient agrees to discharge.  Patient goals were met. Patient is being discharged due to meeting the stated rehab goals.  ?????    Raeford Razor, PT 07/03/18 10:52 AM Phone: [phone] Fax: [phone]

## 2018-07-10 ENCOUNTER — Encounter: Payer: Self-pay | Admitting: Physical Therapy

## 2018-08-01 ENCOUNTER — Encounter: Payer: Self-pay | Admitting: Nurse Practitioner

## 2018-08-01 ENCOUNTER — Ambulatory Visit: Payer: Self-pay | Attending: Nurse Practitioner | Admitting: Nurse Practitioner

## 2018-08-01 DIAGNOSIS — F329 Major depressive disorder, single episode, unspecified: Secondary | ICD-10-CM

## 2018-08-01 DIAGNOSIS — M25512 Pain in left shoulder: Secondary | ICD-10-CM

## 2018-08-01 DIAGNOSIS — E039 Hypothyroidism, unspecified: Secondary | ICD-10-CM

## 2018-08-01 DIAGNOSIS — F419 Anxiety disorder, unspecified: Secondary | ICD-10-CM

## 2018-08-01 MED ORDER — ESCITALOPRAM OXALATE 20 MG PO TABS: 20.0000 mg | ORAL_TABLET | Freq: Every day | ORAL | 1 refills | Status: DC

## 2018-08-01 MED ORDER — LEVOTHYROXINE SODIUM 150 MCG PO TABS: 150.0000 ug | ORAL_TABLET | Freq: Every day | ORAL | 0 refills | Status: DC

## 2018-08-01 MED FILL — LEVOTHYROXINE 150 MCG TAB: 150 | 30 days supply | Qty: 30 | Fill #0

## 2018-08-01 NOTE — Progress Notes (Signed)
Virtual Visit via Telephone Note Due to national recommendations of social distancing due to [place] 19, telehealth visit is felt to be most appropriate for this patient at this time.  I discussed the limitations, risks, security and privacy concerns of performing an evaluation and management service by telephone and the availability of in person appointments. I also discussed with the patient that there may be a patient responsible charge related to this service. The patient expressed understanding and agreed to proceed.    I connected with Andrea Blackwell on 08/01/18  at  10:10 AM EDT  EDT by telephone and verified that I am speaking with the correct person using two identifiers.   Consent I discussed the limitations, risks, security and privacy concerns of performing an evaluation and management service by telephone and the availability of in person appointments. I also discussed with the patient that there may be a patient responsible charge related to this service. The patient expressed understanding and agreed to proceed.   Location of Patient: Private Residence   Location of Provider: [place] and [employer] Office    Persons participating in Telemedicine visit: [name] FNP-BC [place]    History of Present Illness: Telemedicine visit for: Chronic Left Shoulder Pain and Depression  has a past medical history of Allergy, Anxiety, Cataract, Depression, GERD (gastroesophageal reflux disease), Hypothyroidism, and Pancreatitis.  She just finished OP rehab for her left shoulder Pain. Still doing shoulder exercises at home. States her shoulder pain has improved significantly. She also has changed her diet in regards to eliminating mostly processed foods. She feels like she losing weight and has started walking her dog every morning.   Depression Improved. Taking lexapro 20 mg daily as prescribed. Denies any thoughts of self harm.  Depression screen [facility] 2/9  08/01/2018 04/10/2017 03/01/2017 02/01/2017 01/04/2017  Decreased Interest 0 0 0 0 1  Down, Depressed, Hopeless 3 1 0 1 2  PHQ - 2 Score 3 1 0 1 3  Altered sleeping 0 3 3 2 2   Tired, decreased energy 0 3 3 3 3   Change in appetite 0 3 3 1 2   Feeling bad or failure about yourself  0 0 0 1 1  Trouble concentrating 0 1 3 0 2  Moving slowly or fidgety/restless 0 0 0 0 0  Suicidal thoughts 0 0 0 0 0  PHQ-9 Score 3 11 12 8 13     Hypothyroidism Thyroid levels are normal. Taking synthroid as prescribed. She denies fatigue, weight changes, heat/cold intolerance, bowel/skin changes or CVS symptoms. Lab Results  Component Value Date   TSH 2.400 12/20/2017    Past Medical History:  Diagnosis Date  . Allergy   . Anxiety   . Cataract   . Depression   . GERD (gastroesophageal reflux disease)   . Hypothyroidism   . Pancreatitis     Past Surgical History:  Procedure Laterality Date  . CHOLECYSTECTOMY    . ENDOSCOPIC RETROGRADE CHOLANGIOPANCREATOGRAPHY (ERCP) WITH PROPOFOL N/A 02/02/2017   Procedure: ENDOSCOPIC RETROGRADE CHOLANGIOPANCREATOGRAPHY (ERCP) WITH PROPOFOL;  Surgeon: Milus Banister, MD;  Location: [facility];  Service: Endoscopy;  Laterality: N/A;  . ERCP N/A 12/10/2016   Procedure: ENDOSCOPIC RETROGRADE CHOLANGIOPANCREATOGRAPHY (ERCP);  Surgeon: Carol Ada, MD;  Location: [facility];  Service: Endoscopy;  Laterality: N/A;  . ERCP N/A 12/14/2016   Procedure: ENDOSCOPIC RETROGRADE CHOLANGIOPANCREATOGRAPHY (ERCP);  Surgeon: Milus Banister, MD;  Location: [facility];  Service: Endoscopy;  Laterality: N/A;  . LAPAROSCOPIC CHOLECYSTECTOMY SINGLE  SITE WITH INTRAOPERATIVE CHOLANGIOGRAM N/A 12/13/2016   Procedure: LAPAROSCOPIC CHOLECYSTECTOMY SINGLE SITE AND LIVER BIOPSY;  Surgeon: Michael Boston, MD;  Location: [facility];  Service: General;  Laterality: N/A;  . OVARY SURGERY      Family History  Problem Relation Age of Onset  . Diabetes Maternal Grandfather   . Colon cancer Neg Hx    . Esophageal cancer Neg Hx   . Liver cancer Neg Hx   . Pancreatic cancer Neg Hx   . Rectal cancer Neg Hx   . Stomach cancer Neg Hx   . Breast cancer Neg Hx   . Prostate cancer Neg Hx     Social History   Socioeconomic History  . Marital status: Divorced    Spouse name: Not on file  . Number of children: Not on file  . Years of education: Not on file  . Highest education level: Not on file  Occupational History  . Not on file  Social Needs  . Financial resource strain: Not on file  . Food insecurity    Worry: Not on file    Inability: Not on file  . Transportation needs    Medical: Not on file    Non-medical: Not on file  Tobacco Use  . Smoking status: Former Smoker    Quit date: 1995    Years since quitting: 25.4  . Smokeless tobacco: Never Used  Substance and Sexual Activity  . Alcohol use: Yes    Comment: occasional  . Drug use: No  . Sexual activity: Never  Lifestyle  . Physical activity    Days per week: 2 days    Minutes per session: 30 min  . Stress: Only a little  Relationships  . Social connections    Talks on phone: More than three times a week    Gets together: More than three times a week    Attends religious service: More than 4 times per year    Active member of club or organization: No    Attends meetings of clubs or organizations: Never    Relationship status: Divorced  Other Topics Concern  . Not on file  Social History Narrative  . Not on file     Observations/Objective: Awake, alert and oriented x 3   Review of Systems  Constitutional: Negative for fever, malaise/fatigue and weight loss.  HENT: Negative.  Negative for nosebleeds.   Eyes: Negative.  Negative for blurred vision, double vision and photophobia.  Respiratory: Negative.  Negative for cough and shortness of breath.   Cardiovascular: Negative.  Negative for chest pain, palpitations and leg swelling.  Gastrointestinal: Negative.  Negative for heartburn, nausea and vomiting.   Musculoskeletal: Positive for joint pain (mild left shoulder). Negative for myalgias.  Neurological: Negative.  Negative for dizziness, focal weakness, seizures and headaches.  Psychiatric/Behavioral: Positive for depression (well controlled). Negative for suicidal ideas.    Assessment and Plan: Andrea Blackwell was seen today for follow-up.  Diagnoses and all orders for this visit:  Anxiety and depression -     escitalopram (LEXAPRO) 20 MG tablet; Take 1 tablet (20 mg total) by mouth at bedtime.  Hypothyroidism, unspecified type -     levothyroxine (SYNTHROID) 150 MCG tablet; Take 1 tablet (150 mcg total) by mouth daily before breakfast. Please mail     Follow Up Instructions Return in about 3 months (around 11/01/2018).     I discussed the assessment and treatment plan with the patient. The patient was provided an opportunity to ask  questions and all were answered. The patient agreed with the plan and demonstrated an understanding of the instructions.   The patient was advised to call back or seek an in-person evaluation if the symptoms worsen or if the condition fails to improve as anticipated.  I provided 24 minutes of non-face-to-face time during this encounter including median intraservice time, reviewing previous notes, labs, imaging, medications and explaining diagnosis and management.  Gildardo Pounds, FNP-BC

## 2018-08-07 ENCOUNTER — Ambulatory Visit: Payer: Self-pay | Attending: Nurse Practitioner

## 2018-08-07 ENCOUNTER — Other Ambulatory Visit: Payer: Self-pay

## 2018-08-22 ENCOUNTER — Ambulatory Visit: Payer: Self-pay

## 2018-09-03 ENCOUNTER — Other Ambulatory Visit: Payer: Self-pay

## 2018-09-03 ENCOUNTER — Ambulatory Visit: Payer: Self-pay | Attending: Family Medicine

## 2018-09-18 ENCOUNTER — Other Ambulatory Visit: Payer: Self-pay

## 2018-09-18 ENCOUNTER — Ambulatory Visit: Payer: Self-pay | Attending: Family Medicine

## 2018-10-05 MED FILL — ESCITALOPRAM 20 MG TABLET: 20 | 30 days supply | Qty: 30 | Fill #0

## 2018-10-05 MED FILL — LEVOTHYROXINE 150 MCG TAB: 150 | 30 days supply | Qty: 30 | Fill #1

## 2018-10-17 ENCOUNTER — Ambulatory Visit: Payer: Self-pay | Attending: Family Medicine

## 2018-10-17 ENCOUNTER — Other Ambulatory Visit: Payer: Self-pay

## 2018-10-19 ENCOUNTER — Ambulatory Visit: Payer: Self-pay | Admitting: Nurse Practitioner

## 2018-10-28 IMAGING — MG DIGITAL SCREENING BILATERAL MAMMOGRAM WITH CAD
5 series · 5 of 5 positions shown · non-contrast
Comparison: Previous exam(s).

CLINICAL DATA: Screening.

EXAM:
DIGITAL SCREENING BILATERAL MAMMOGRAM WITH CAD

[L CC (1 of 2)]
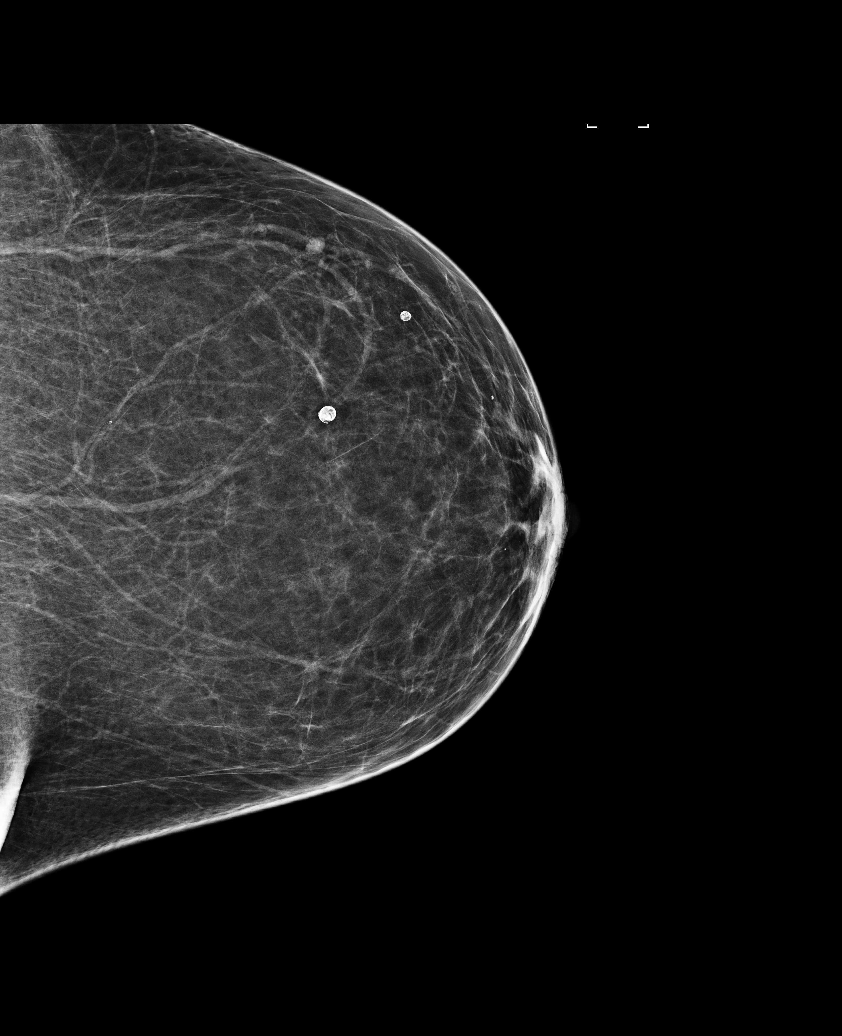

[R MLO]
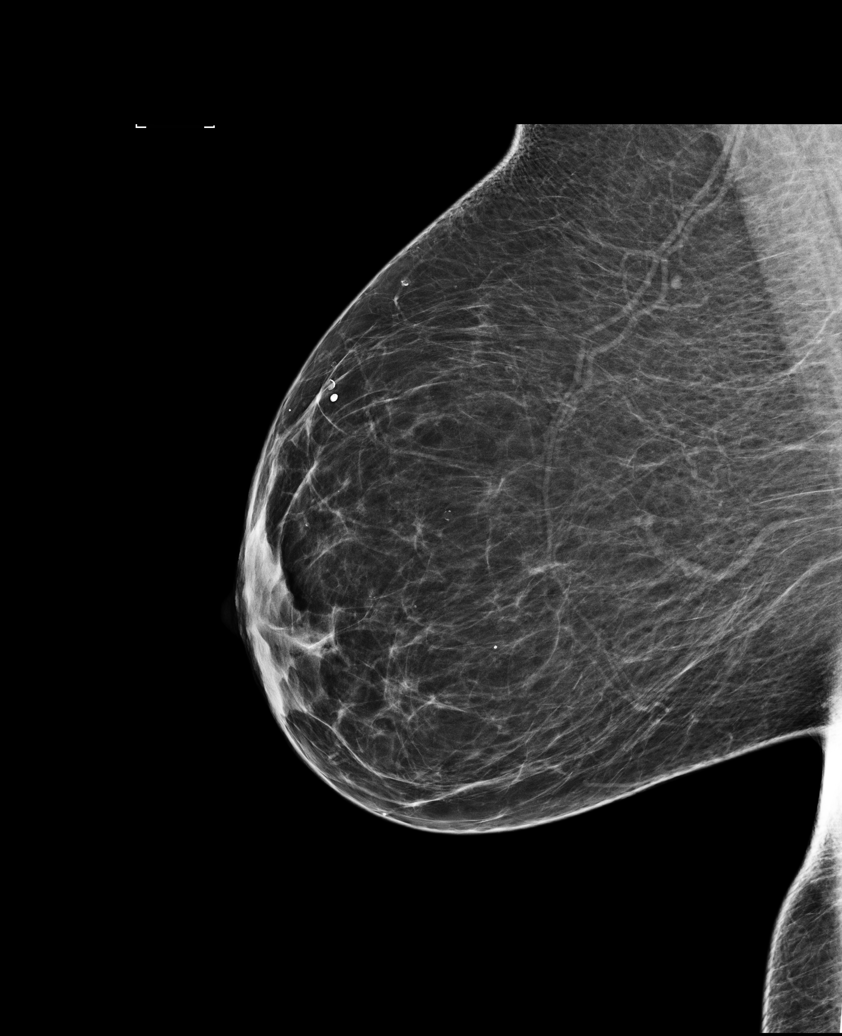

[L CC (2 of 2)]
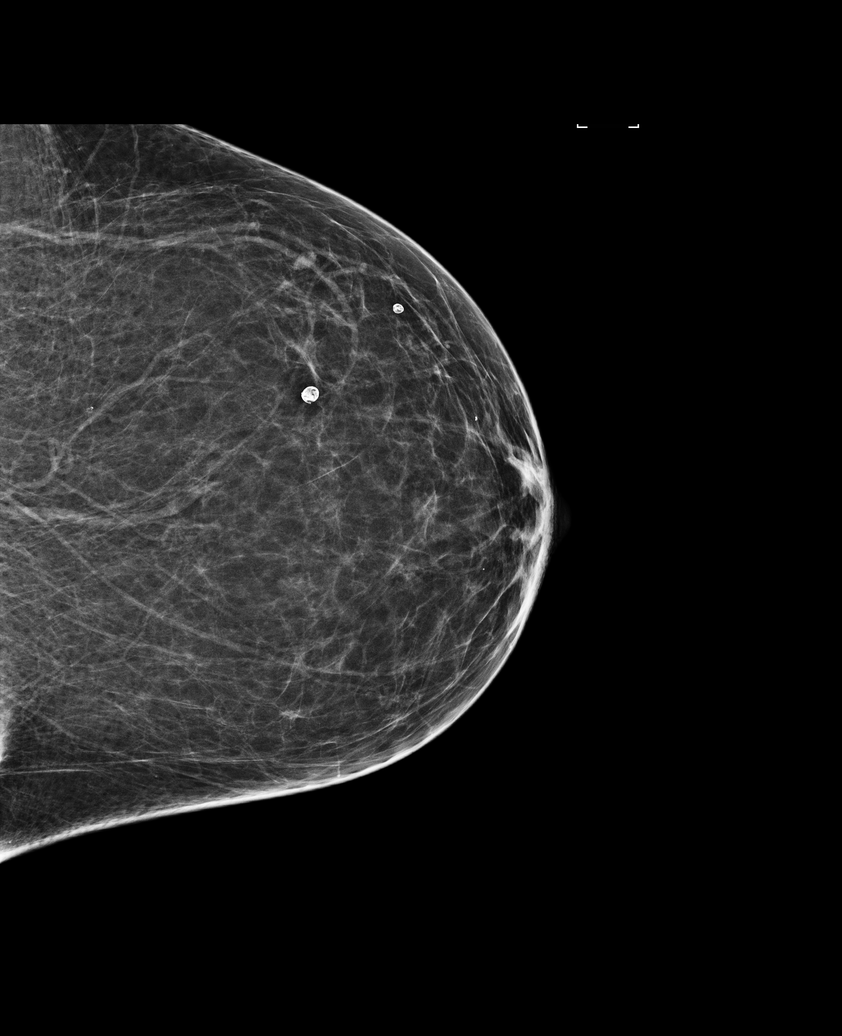

[R CC]
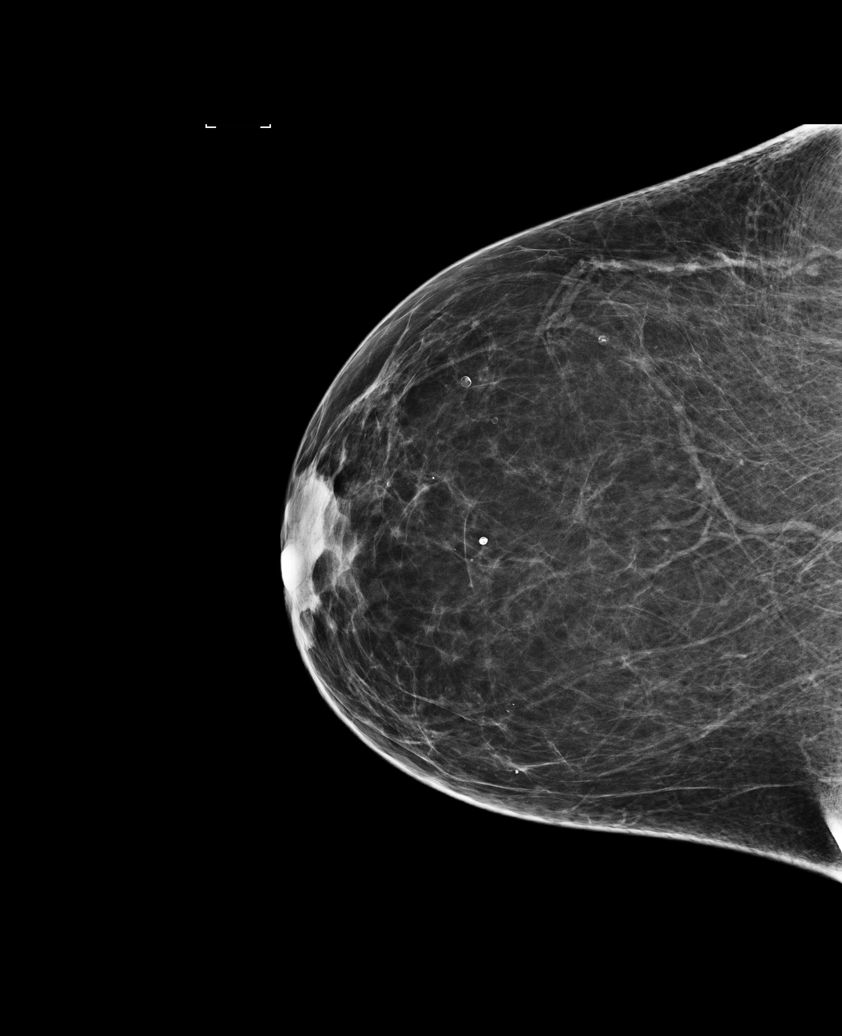

[L MLO]
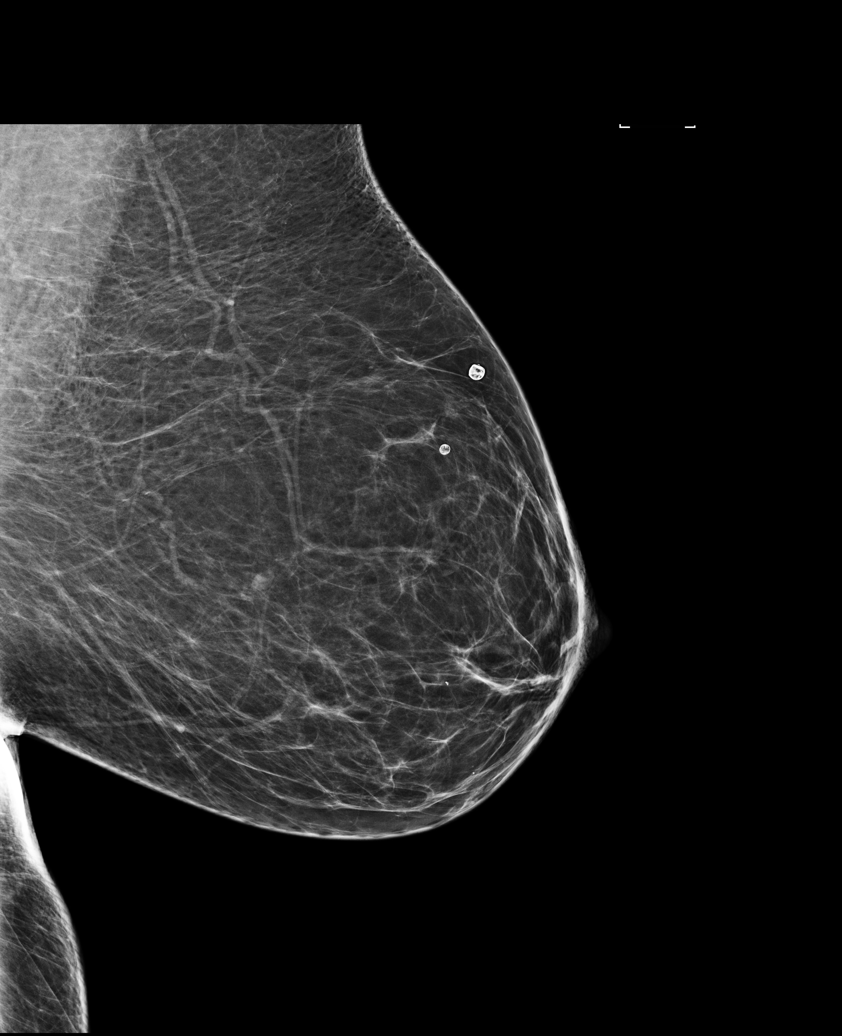

[5 of 5 positions shown; findings below may reference images not displayed]

ACR Breast Density Category b: There are scattered areas of
fibroglandular density.
FINDINGS: There are no findings suspicious for malignancy. Images were
processed with CAD.
IMPRESSION: No mammographic evidence of malignancy. A result letter of this
screening mammogram will be mailed directly to the patient.

RECOMMENDATION:
Screening mammogram in one year. (Code:AS-G-LCT)

BI-RADS CATEGORY  1: Negative.

## 2018-11-01 ENCOUNTER — Other Ambulatory Visit: Payer: Self-pay

## 2018-11-01 DIAGNOSIS — Z20822 Contact with and (suspected) exposure to covid-19: Secondary | ICD-10-CM

## 2018-11-02 LAB — NOVEL CORONAVIRUS, NAA: SARS-CoV-2, NAA: NOT DETECTED

## 2018-11-13 ENCOUNTER — Ambulatory Visit: Payer: Self-pay | Admitting: Nurse Practitioner

## 2018-11-20 ENCOUNTER — Other Ambulatory Visit: Payer: Self-pay

## 2018-11-20 ENCOUNTER — Encounter: Payer: Self-pay | Admitting: Nurse Practitioner

## 2018-11-20 ENCOUNTER — Ambulatory Visit (HOSPITAL_BASED_OUTPATIENT_CLINIC_OR_DEPARTMENT_OTHER): Payer: Self-pay | Admitting: Pharmacist

## 2018-11-20 ENCOUNTER — Ambulatory Visit: Payer: Self-pay | Attending: Nurse Practitioner | Admitting: Nurse Practitioner

## 2018-11-20 ENCOUNTER — Telehealth: Payer: Self-pay | Admitting: Nurse Practitioner

## 2018-11-20 VITALS — BP 103/69 | HR 66 | Temp 99.1°F | Ht 67.0 in | Wt 207.8 lb

## 2018-11-20 DIAGNOSIS — M25552 Pain in left hip: Secondary | ICD-10-CM

## 2018-11-20 DIAGNOSIS — G8929 Other chronic pain: Secondary | ICD-10-CM

## 2018-11-20 DIAGNOSIS — Z23 Encounter for immunization: Secondary | ICD-10-CM

## 2018-11-20 DIAGNOSIS — E039 Hypothyroidism, unspecified: Secondary | ICD-10-CM

## 2018-11-20 NOTE — Patient Instructions (Addendum)
[facility] [phone] [place]

## 2018-11-20 NOTE — Progress Notes (Signed)
Patient presents for vaccination against influenza per orders of Zelda. Consent given. Counseling provided. No contraindications exists. Vaccine administered without incident.   

## 2018-11-20 NOTE — Progress Notes (Signed)
Assessment & Plan:  Andrea Blackwell was seen today for follow-up.  Diagnoses and all orders for this visit:  Chronic left hip pain -     Ambulatory referral to Physical Therapy -     CBC -     VITAMIN D 25 Hydroxy (Vit-D Deficiency, Fractures) -     CMP14+EGFR -     Lipid panel Work on losing weight to help reduce joint pain. May alternate with heat and ice application for pain relief. May also alternate with acetaminophen and Ibuprofen as prescribed pain relief. Other alternatives include massage, acupuncture and water aerobics.  You must stay active and avoid a sedentary lifestyle.  Hypothyroidism, unspecified type Lab Results  Component Value Date   TSH 2.400 12/20/2017  Take levothyroxine as prescribed. Do not skip doses.  Andrea Blackwell refused to have her TSH level drawn today  Patient has appropriate routine health concerns for screening and prevention. These are reviewed and up-to-date. Referrals have been placed accordingly. Immunizations are up-to-date or declined.    Subjective:   Chief Complaint  Patient presents with  . Follow-up    Pt. is here for a follow up.    HPI Andrea Blackwell 63 y.o. female presents to office today for follow up.   has a past medical history of Allergy, Anxiety, Cataract, Depression, Diabetes mellitus without complication ([place]), GERD (gastroesophageal reflux disease), Hypothyroidism, and Pancreatitis.   Left Hip pain Andrea Blackwell fell on her left side several months ago (02-18-2018). 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 She was on a flat surface when she fell.  She had been seen in the emergency room in [facility] for the fall and was discharged home on no medications.  Per her report an x-ray was performed of her left shoulder with no abnormal findings.  At that time she only reported left shoulder pain and today she endorses left hip pain. I asked her  when did the hip pain start and she stated "Oh it was hurting when I fell that time but just not as much as my left shoulder.". She was referred to physical therapy for her shoulder after the injury. Today she is requesting another referral for her chronic hip pain.   Relieving factors: non weight bearing. heating pad, robaxin, bed massager.  Pain described as a dull ache. Worse after walking which she does for 30 minutes a day. Has Lost 4lbs.    Hypothyroidism She states she has been taking extra thyroid medication when she feels tired. I instructed her to stop doing this immediately and that this is very dangerous and she should never take any prescription medication over the prescribed amount unless instructed by the prescriber.   Review of Systems  Constitutional: Negative for fever, malaise/fatigue and weight loss.  HENT: Negative.  Negative for nosebleeds.   Eyes: Negative.  Negative for blurred vision, double vision and photophobia.  Respiratory: Negative.  Negative for cough and shortness of breath.   Cardiovascular: Negative.  Negative for chest pain, palpitations and leg swelling.  Gastrointestinal: Positive for heartburn. Negative for nausea and vomiting.  Musculoskeletal: Positive for joint pain. Negative for myalgias.  Neurological: Negative.  Negative for dizziness, focal weakness, seizures and headaches.  Psychiatric/Behavioral: Positive for depression. Negative for suicidal ideas.    Past Medical History:  Diagnosis Date  . Allergy   . Anxiety   .  Cataract   . Depression   . Diabetes mellitus without complication ([place])   . GERD (gastroesophageal reflux disease)   . Hypothyroidism   . Pancreatitis     Past Surgical History:  Procedure Laterality Date  . CHOLECYSTECTOMY    . ENDOSCOPIC RETROGRADE CHOLANGIOPANCREATOGRAPHY (ERCP) WITH PROPOFOL N/A 02/02/2017   Procedure: ENDOSCOPIC RETROGRADE CHOLANGIOPANCREATOGRAPHY (ERCP) WITH PROPOFOL;  Surgeon: Milus Banister, MD;   Location: [facility];  Service: Endoscopy;  Laterality: N/A;  . ERCP N/A 12/10/2016   Procedure: ENDOSCOPIC RETROGRADE CHOLANGIOPANCREATOGRAPHY (ERCP);  Surgeon: Carol Ada, MD;  Location: [facility];  Service: Endoscopy;  Laterality: N/A;  . ERCP N/A 12/14/2016   Procedure: ENDOSCOPIC RETROGRADE CHOLANGIOPANCREATOGRAPHY (ERCP);  Surgeon: Milus Banister, MD;  Location: [facility];  Service: Endoscopy;  Laterality: N/A;  . LAPAROSCOPIC CHOLECYSTECTOMY SINGLE SITE WITH INTRAOPERATIVE CHOLANGIOGRAM N/A 12/13/2016   Procedure: LAPAROSCOPIC CHOLECYSTECTOMY SINGLE SITE AND LIVER BIOPSY;  Surgeon: Michael Boston, MD;  Location: [facility];  Service: General;  Laterality: N/A;  . OVARY SURGERY      Family History  Problem Relation Age of Onset  . Diabetes Maternal Grandfather   . Colon cancer Neg Hx   . Esophageal cancer Neg Hx   . Liver cancer Neg Hx   . Pancreatic cancer Neg Hx   . Rectal cancer Neg Hx   . Stomach cancer Neg Hx   . Breast cancer Neg Hx   . Prostate cancer Neg Hx     Social History Reviewed with no changes to be made today.   Outpatient Medications Prior to Visit  Medication Sig Dispense Refill  . fluticasone (FLONASE) 50 MCG/ACT nasal spray Place 2 sprays into both nostrils daily. 16 g 6  . levothyroxine (SYNTHROID) 150 MCG tablet Take 1 tablet (150 mcg total) by mouth daily before breakfast. Please mail 90 tablet 0  . Probiotic CAPS Take 1 capsule by mouth daily.    Marland Kitchen triamcinolone ointment (KENALOG) 0.5 % Apply 1 application topically 2 (two) times daily. 30 g 0  . escitalopram (LEXAPRO) 20 MG tablet Take 1 tablet (20 mg total) by mouth at bedtime. 90 tablet 1   No facility-administered medications prior to visit.     Allergies  Allergen Reactions  . Penicillins Anaphylaxis and Other (See Comments)    Has patient had a PCN reaction causing immediate rash, facial/tongue/throat swelling, SOB or lightheadedness with hypotension: yes Has patient had a PCN reaction  causing severe rash involving mucus membranes or skin necrosis: no Has patient had a PCN reaction that required hospitalization: yes Has patient had a PCN reaction occurring within the last 10 years: no If all of the above answers are "NO", then may proceed with Cephalosporin use.        Objective:    BP 103/69 (BP Location: Left Arm, Patient Position: Sitting, Cuff Size: Large)   Pulse 66   Temp 99.1 F (37.3 C) (Oral)   Ht '5\' 7"'  (1.702 m)   Wt 207 lb 12.8 oz (94.3 kg)   SpO2 96%   BMI 32.55 kg/m  Wt Readings from Last 3 Encounters:  11/20/18 207 lb 12.8 oz (94.3 kg)  03/21/18 211 lb 3.2 oz (95.8 kg)  03/12/18 206 lb 3.2 oz (93.5 kg)    Physical Exam Vitals signs and nursing note reviewed.  Constitutional:      Appearance: She is well-developed.  HENT:     Head: Normocephalic and atraumatic.  Neck:     Musculoskeletal: Normal range of motion.  Cardiovascular:  Rate and Rhythm: Normal rate and regular rhythm.     Heart sounds: Normal heart sounds. No murmur. No friction rub. No gallop.   Pulmonary:     Effort: Pulmonary effort is normal. No tachypnea or respiratory distress.     Breath sounds: Normal breath sounds. No decreased breath sounds, wheezing, rhonchi or rales.  Chest:     Chest wall: No tenderness.  Abdominal:     General: Bowel sounds are normal.     Palpations: Abdomen is soft.  Musculoskeletal: Normal range of motion.        General: No swelling or tenderness.     Left hip: She exhibits normal range of motion, normal strength and no swelling.  Skin:    General: Skin is warm and dry.  Neurological:     Mental Status: She is alert and oriented to person, place, and time.     Coordination: Coordination normal.  Psychiatric:        Behavior: Behavior normal. Behavior is cooperative.        Thought Content: Thought content normal.        Judgment: Judgment normal.          Patient has been counseled extensively about nutrition and exercise as  well as the importance of adherence with medications and regular follow-up. The patient was given clear instructions to go to ER or return to medical center if symptoms don't improve, worsen or new problems develop. The patient verbalized understanding.   Follow-up: Return in about 6 months (around 05/21/2019).   Gildardo Pounds, FNP-BC [facility] and [place], [place]   11/20/2018, 9:03 AM

## 2018-11-20 NOTE — Telephone Encounter (Signed)
Notified. 

## 2018-11-20 NOTE — Telephone Encounter (Signed)
New Message   Pt refused [id number]

## 2018-11-21 ENCOUNTER — Encounter: Payer: Self-pay | Admitting: Nurse Practitioner

## 2018-11-21 LAB — CMP14+EGFR
ALT: 21 IU/L (ref 0–32)
AST: 17 IU/L (ref 0–40)
Albumin/Globulin Ratio: 2.1 (ref 1.2–2.2)
Albumin: 4.4 g/dL (ref 3.8–4.8)
Alkaline Phosphatase: 62 IU/L (ref 39–117)
BUN/Creatinine Ratio: 25 (ref 12–28)
BUN: 15 mg/dL (ref 8–27)
Bilirubin Total: 0.4 mg/dL (ref 0.0–1.2)
CO2: 22 mmol/L (ref 20–29)
Calcium: 9.6 mg/dL (ref 8.7–10.3)
Chloride: 105 mmol/L (ref 96–106)
Creatinine, Ser: 0.6 mg/dL (ref 0.57–1.00)
GFR calc Af Amer: 114 mL/min/{1.73_m2} (ref >59)
GFR calc non Af Amer: 99 mL/min/{1.73_m2} (ref >59)
Globulin, Total: 2.1 g/dL (ref 1.5–4.5)
Glucose: 111 mg/dL — ABNORMAL HIGH (ref 65–99)
Potassium: 4.4 mmol/L (ref 3.5–5.2)
Sodium: 141 mmol/L (ref 134–144)
Total Protein: 6.5 g/dL (ref 6.0–8.5)

## 2018-11-21 LAB — LIPID PANEL
Chol/HDL Ratio: 3.2 ratio (ref 0.0–4.4)
Cholesterol, Total: 272 mg/dL — ABNORMAL HIGH (ref 100–199)
HDL: 85 mg/dL (ref >39)
LDL Chol Calc (NIH): 173 mg/dL — ABNORMAL HIGH (ref 0–99)
Triglycerides: 86 mg/dL (ref 0–149)
VLDL Cholesterol Cal: 14 mg/dL (ref 5–40)

## 2018-11-21 LAB — CBC
Hematocrit: 43.3 % (ref 34.0–46.6)
Hemoglobin: 14.6 g/dL (ref 11.1–15.9)
MCH: 30.1 pg (ref 26.6–33.0)
MCHC: 33.7 g/dL (ref 31.5–35.7)
MCV: 89 fL (ref 79–97)
Platelets: 241 10*3/uL (ref 150–450)
RBC: 4.85 x10E6/uL (ref 3.77–5.28)
RDW: 12.1 % (ref 11.7–15.4)
WBC: 4.3 10*3/uL (ref 3.4–10.8)

## 2018-11-21 LAB — VITAMIN D 25 HYDROXY (VIT D DEFICIENCY, FRACTURES): Vit D, 25-Hydroxy: 25.7 ng/mL — ABNORMAL LOW (ref 30.0–100.0)

## 2018-11-21 MED ORDER — OMEGA-3-ACID ETHYL ESTERS 1 G PO CAPS: 2.0000 g | ORAL_CAPSULE | Freq: Two times a day (BID) | ORAL | 2 refills | Status: DC

## 2018-11-22 MED FILL — OMEGA-3 ETHYL ESTERS 1 GM C: 1 | 30 days supply | Qty: 120 | Fill #0

## 2018-12-10 ENCOUNTER — Ambulatory Visit: Payer: Self-pay | Attending: Nurse Practitioner | Admitting: Physical Therapy

## 2018-12-10 ENCOUNTER — Other Ambulatory Visit: Payer: Self-pay

## 2018-12-10 ENCOUNTER — Encounter: Payer: Self-pay | Admitting: Physical Therapy

## 2018-12-10 DIAGNOSIS — M25552 Pain in left hip: Secondary | ICD-10-CM

## 2018-12-10 DIAGNOSIS — M6281 Muscle weakness (generalized): Secondary | ICD-10-CM

## 2018-12-10 DIAGNOSIS — M25652 Stiffness of left hip, not elsewhere classified: Secondary | ICD-10-CM

## 2018-12-10 NOTE — Patient Instructions (Addendum)
Lower Trunk Rotation Stretch    Keeping back flat and feet together, rotate knees to left side. Hold __15_ seconds. Repeat __5__ times per set. Do ___1_ sets per session. Do ___2_ sessions per day.  http://orth.exer.us/123   Copyright  VHI. All rights reserved.     Knee to Chest    Lying supine, bend involved knee to chest _3__ times. Repeat with other leg. Do _2__ times per day.  Copyright  VHI. All rights reserved.    Piriformis Stretch - Supine    Pull uninvolved knee across body to opposite shoulder. Hold slight stretch for __30_ seconds. Repeat with involved leg. Repeat __3_ times. Do _2__ times per day.  Copyright  VHI. All rights reserved.  HIP: Hamstrings - Short Sitting    Rest leg on raised surface. Keep knee straight. Lift chest. Hold _30__ seconds. __3_ reps per set, __1_ sets per day, __7_ days per week  Copyright  VHI. All rights reserved.   Hip Flexor Stretch - Standing    Right leg behind, foot facing forward, left knee bent to 90. Place hands on wall. Bend right knee directly under hip. Squeeze buttock muscles and push hips forward. Hold position for __3_ breaths. Repeat _3__ times. Repeat with other leg. Do __2_ times per day.  Copyright  VHI. All rights reserved.

## 2018-12-10 NOTE — Therapy (Signed)
[place], [place], [address] Phone: [phone]   Fax:  [phone]  Physical Therapy Evaluation  Patient Details  Name: Andrea Blackwell MRN: [id number] Date of Birth: [birth year] Referring Provider (PT): Dr. [name]    Encounter Date: 12/10/2018  PT End of Session - 12/10/18 1519    Visit Number  1    Number of Visits  12    Date for PT Re-Evaluation  01/25/19    PT Start Time  [id number]    PT Stop Time  1450    PT Time Calculation (min)  45 min       Past Medical History:  Diagnosis Date  . Allergy   . Anxiety   . Cataract   . Depression   . Diabetes mellitus without complication ([place])   . GERD (gastroesophageal reflux disease)   . Hypothyroidism   . Pancreatitis     Past Surgical History:  Procedure Laterality Date  . CHOLECYSTECTOMY    . ENDOSCOPIC RETROGRADE CHOLANGIOPANCREATOGRAPHY (ERCP) WITH PROPOFOL N/A 02/02/2017   Procedure: ENDOSCOPIC RETROGRADE CHOLANGIOPANCREATOGRAPHY (ERCP) WITH PROPOFOL;  Surgeon: Milus Banister, MD;  [facility]: [facility];  Service: Endoscopy;  Laterality: N/A;  . ERCP N/A 12/10/2016   Procedure: ENDOSCOPIC RETROGRADE CHOLANGIOPANCREATOGRAPHY (ERCP);  Surgeon: Carol Ada, MD;  Location: [facility];  Service: Endoscopy;  Laterality: N/A;  . ERCP N/A 12/14/2016   Procedure: ENDOSCOPIC RETROGRADE CHOLANGIOPANCREATOGRAPHY (ERCP);  Surgeon: Milus Banister, MD;  Location: [facility];  Service: Endoscopy;  Laterality: N/A;  . LAPAROSCOPIC CHOLECYSTECTOMY SINGLE SITE WITH INTRAOPERATIVE CHOLANGIOGRAM N/A 12/13/2016   Procedure: LAPAROSCOPIC CHOLECYSTECTOMY SINGLE SITE AND LIVER BIOPSY;  Surgeon: Michael Boston, MD;  [facility]: [facility];  Service: General;  Laterality: N/A;  . OVARY SURGERY      There were no vitals filed for this visit.   Subjective Assessment - 12/10/18 1409    Subjective  Andrea Blackwell is here for L sided hip pain which began after a fall in Jan 2020.  At  that time, her shoulder was the issue.  L hip pain increaed about 3 mos ago. She has difficulty with "everything" but admits to pain mostly with sitting too long and then when she wakes up in the AM.  She can walk 30 min briskly without much limitations.  She has pain laying on L side.  Radiates to prox thigh, not to her knee. She denies numbness or tingling but does have a sense of weakness in LLE.    Pertinent History  diabetes, GERD, hypothyroid, L shoulder pain, anxiety    Limitations  Sitting;Lifting;Standing;House hold activities    How long can you sit comfortably?  its the getting up that is hard, >1 hour is very difficult    How long can you stand comfortably?  hard to stand still, moves alot    How long can you walk comfortably?  30 min no problem    Diagnostic tests  no XR    Patient Stated Goals  Strengthen hip and other exercises to do    Currently in Pain?  Yes    Pain Score  3     Pain Location  Hip    Pain Orientation  Left;Proximal;Lateral    Pain Descriptors / Indicators  Aching;Dull    Pain Type  Chronic pain    Pain Radiating Towards  anterior thigh>post thigh    Pain Onset  More than a month ago    Pain Frequency  Intermittent  Aggravating Factors   transitional movements, sitting , standing still    Pain Relieving Factors  weight shifting , massage feature on mattress,    Effect of Pain on Daily Activities  does not want it to progress , limits comfort    Multiple Pain Sites  No         [facility] PT Assessment - 12/10/18 0001      Assessment   Medical Diagnosis  L hip pain, chronic     Referring Provider (PT)  Dr. [name]     Onset Date/Surgical Date  --   Jan 2020   Prior Therapy  Yes for shoulder       Precautions   Precautions  None      Restrictions   Weight Bearing Restrictions  No      Balance Screen   Has the patient fallen in the past 6 months  No      [place] residence    Living Arrangements   Non-relatives/Friends    [place] to enter    Entrance Stairs-Number of Steps  [place]  One level      Prior [place]  Unemployed    [place], reading, social friends, dog       Cognition   Overall Cognitive Status  Within Functional Limits for tasks assessed      Observation/Other Assessments   Focus on Therapeutic Outcomes (FOTO)   30%      Sensation   Light Touch  Appears Intact      Squat   Comments  no hip pain but just "exercise" feeling in LEs       Single Leg Stance   Comments  more challenging on L LE , 10 sec       Posture/Postural Control   Posture/Postural Control  Postural limitations    Postural Limitations  Rounded Shoulders;Forward head      PROM   Overall PROM Comments  L hip pain with flexion, ER>IR, tight with FADER, FABER      Strength   Right Hip Flexion  4+/5    Right Hip Extension  4+/5    Right Hip ABduction  4+/5    Left Hip Flexion  4/5    Left Hip Extension  4/5    Left Hip ABduction  4/5    Right Knee Flexion  5/5    Right Knee Extension  5/5    Left Knee Flexion  5/5    Left Knee Extension  5/5      Flexibility   Hamstrings  tight, 55 deg     Piriformis  tight      Palpation   Palpation comment  pain along L Gr Trochanter, into gluteal especially glute medius and mild pain in piriformis. Highly sensitive       Special Tests    Special Tests  Hip Special Tests    Hip Special Tests   Saralyn Pilar (FABER) Test;Hip Scouring      Saralyn Pilar [facility]) Test   Findings  Positive    Side  Left    Comments  pain L hip lateral       Hip Scouring   Findings  Negative    Side  Left    Comments  pain initially then 1 x no pain  Objective measurements completed on examination: See above findings.      PT Education - 12/10/18 1519    Education Details  PT/POC, HEP, stretch after walking, eval findings    Person(s) Educated  Patient    Methods   Explanation;Demonstration;Handout    Comprehension  Verbalized understanding;Returned demonstration;Need further instruction          PT Long Term Goals - 12/10/18 1520      PT LONG TERM GOAL #1   Title  Pt will be I with HEP    Time  6    Period  Weeks    Status  New    Target Date  01/25/19      PT LONG TERM GOAL #2   Title  FOTO score will decrease to < 25% limited to demo functional improvement    Time  6    Period  Weeks    Status  New    Target Date  01/25/19      PT LONG TERM GOAL #3   Title  Pt will be able to sit to stand with less hip pain (<2/10) and without the need to sway, shift weight when standing for ADLs, home tasks    Time  6    Period  Weeks    Status  New    Target Date  01/25/19      PT LONG TERM GOAL #4   Title  Pt will report no pain increase following 30 min walk, near a baseline of 1/10- 2/10 after stretching    Time  6    Period  Weeks    Status  New    Target Date  01/25/19      PT LONG TERM GOAL #5   Title  L hip strength will be equal to Rt hip in flexion, abduction and extension    Time  6    Period  Weeks    Status  New    Target Date  01/25/19             Plan - 12/10/18 1524    Clinical Impression Statement  this patient presents for low complexity eval of chronic L hip pain which signs and symptoms are consistent with musculoskeletal tissue injury.  She shows decreased hip AROM, PROM, minor strength deficits and hypertonicity in L gluteals, piriformis as well as ITB and hamstring.  I suspect degenerative changes with possible tendinopathy and or bursitis due to the nature of her symptoms and MOI.  She should do well with PT intevention to gain more mobility with her daily activities.    Personal Factors and Comorbidities  Time since onset of injury/illness/exacerbation;Comorbidity 1;Behavior Pattern    Comorbidities  diabetes, anxiety and some fear avoidance    Examination-Activity Limitations   Squat;Stairs;Lift;Bend;Locomotion Level;Stand;Transfers;Sit;Sleep    Examination-Participation Restrictions  Church;Shop;Volunteer;Community Activity    Stability/Clinical Decision Making  Stable/Uncomplicated    Clinical Decision Making  Low    Rehab Potential  Excellent    PT Frequency  2x / week    PT Duration  6 weeks    PT Treatment/Interventions  ADLs/Self Care Home Management;Electrical Stimulation;Therapeutic activities;Patient/family education;Taping;Therapeutic exercise;Iontophoresis 4mg /ml Dexamethasone;Moist Heat;Ultrasound;Cryotherapy;Functional mobility training;Gait training;Stair training;Balance training;Neuromuscular re-education;Manual techniques;Dry needling;Passive range of motion    PT Next Visit Plan  check HEP for flexibility, add to HEP for light strength, modalites as needed    PT Home Exercise Plan  stretching: ant hip, hamstring, piriformis, knee to chest    Consulted and Agree  with Plan of Care  Patient       Patient will benefit from skilled therapeutic intervention in order to improve the following deficits and impairments:  Impaired flexibility, Obesity, Decreased strength, Decreased range of motion, Pain, Improper body mechanics, Increased fascial restricitons, Decreased mobility  Visit Diagnosis: Pain in left hip  Stiffness of left hip, not elsewhere classified  Muscle weakness (generalized)     Problem List Patient Active Problem List   Diagnosis Date Noted  . Encounter for removal of biliary stent   . Anxiety and depression 02/01/2017  . Bile duct leak   . Acute calculous cholecystitis 12/13/2016  . Jaundice 12/13/2016  . Obesity 12/13/2016  . Hypothyroidism   . Gallstone pancreatitis 12/10/2016    PAA,JENNIFER 12/10/2018, 3:38 PM  [facility] [address] [place], [place], [address] Phone: [phone]   Fax:  [phone]  Name: Audelia Lesso MRN: [id number] Date of Birth:  1955-11-14   Raeford Razor, PT 12/10/18 3:38 PM Phone: [phone] Fax: [phone]

## 2018-12-19 ENCOUNTER — Ambulatory Visit: Payer: Self-pay | Admitting: Physical Therapy

## 2018-12-21 ENCOUNTER — Ambulatory Visit: Payer: Self-pay | Admitting: Physical Therapy

## 2018-12-21 ENCOUNTER — Encounter: Payer: Self-pay | Admitting: Physical Therapy

## 2018-12-21 ENCOUNTER — Other Ambulatory Visit: Payer: Self-pay

## 2018-12-21 DIAGNOSIS — M25652 Stiffness of left hip, not elsewhere classified: Secondary | ICD-10-CM

## 2018-12-21 DIAGNOSIS — M25552 Pain in left hip: Secondary | ICD-10-CM

## 2018-12-21 DIAGNOSIS — M6281 Muscle weakness (generalized): Secondary | ICD-10-CM

## 2018-12-21 NOTE — Patient Instructions (Signed)
Access Code: [email or web address]  URL: [email or web address]  Date: 12/21/2018  Prepared by: Raeford Razor   Exercises  Supine Figure 4 Piriformis Stretch - 3 reps - 1 sets - 30 hold - 1x daily - 7x weekly  Seated Piriformis Stretch with Trunk Bend - 3 reps - 1 sets - 30 hold - 1x daily - 7x weekly  Supine Hamstring Stretch - 3 reps - 1 sets - 30 hold - 1x daily - 7x weekly  Bridge - 10 reps - 2 sets - 5 hold - 1x daily - 7x weekly  Clamshell with Resistance - 10 reps - 2 sets - 5 hold - 1x daily - 7x weekly

## 2018-12-21 NOTE — Therapy (Signed)
[place], [place], [address] Phone: [phone]   Fax:  [phone]  Physical Therapy Treatment  Patient Details  Name: Andrea Blackwell MRN: [id number] Date of Birth: Jun 22, 1955 Referring Provider (PT): Dr. [name]    Encounter Date: 12/21/2018  PT End of Session - 12/21/18 1232    Visit Number  2    Number of Visits  12    Date for PT Re-Evaluation  01/25/19    PT Start Time  1215    PT Stop Time  1305    PT Time Calculation (min)  50 min    Activity Tolerance  Patient tolerated treatment well    Behavior During Therapy  [facility] for tasks assessed/performed       Past Medical History:  Diagnosis Date  . Allergy   . Anxiety   . Cataract   . Depression   . Diabetes mellitus without complication ([place])   . GERD (gastroesophageal reflux disease)   . Hypothyroidism   . Pancreatitis     Past Surgical History:  Procedure Laterality Date  . CHOLECYSTECTOMY    . ENDOSCOPIC RETROGRADE CHOLANGIOPANCREATOGRAPHY (ERCP) WITH PROPOFOL N/A 02/02/2017   Procedure: ENDOSCOPIC RETROGRADE CHOLANGIOPANCREATOGRAPHY (ERCP) WITH PROPOFOL;  Surgeon: Milus Banister, MD;  Location: [facility];  Service: Endoscopy;  Laterality: N/A;  . ERCP N/A 12/10/2016   Procedure: ENDOSCOPIC RETROGRADE CHOLANGIOPANCREATOGRAPHY (ERCP);  Surgeon: Carol Ada, MD;  Location: [facility];  Service: Endoscopy;  Laterality: N/A;  . ERCP N/A 12/14/2016   Procedure: ENDOSCOPIC RETROGRADE CHOLANGIOPANCREATOGRAPHY (ERCP);  Surgeon: Milus Banister, MD;  Location: [facility];  Service: Endoscopy;  Laterality: N/A;  . LAPAROSCOPIC CHOLECYSTECTOMY SINGLE SITE WITH INTRAOPERATIVE CHOLANGIOGRAM N/A 12/13/2016   Procedure: LAPAROSCOPIC CHOLECYSTECTOMY SINGLE SITE AND LIVER BIOPSY;  Surgeon: Michael Boston, MD;  Location: [facility];  Service: General;  Laterality: N/A;  . OVARY SURGERY      There were no vitals filed for this visit.  Subjective  Assessment - 12/21/18 1250    Subjective  Min pain, the stretching after walking is really helping    Currently in Pain?  Yes    Pain Score  1     Pain Location  Hip    Pain Orientation  Left;Proximal    Pain Descriptors / Indicators  Aching    Pain Type  Chronic pain    Pain Onset  More than a month ago    Pain Frequency  Intermittent            [facility] Adult PT Treatment/Exercise - 12/21/18 0001      Knee/Hip Exercises: Stretches   Hip Flexor Stretch  Both;3 reps;20 seconds    Piriformis Stretch  Both;3 reps;30 seconds    Piriformis Stretch Limitations  figure 4 knee press out, pulled across as well with less glute     Other Knee/Hip Stretches  knee to chest with rotation x 3     Other Knee/Hip Stretches  seated and supine hip stretching       Knee/Hip Exercises: Aerobic   Nustep  L6 UE and LE       Knee/Hip Exercises: Supine   Hip Adduction Isometric  Strengthening;Both;1 set;10 reps    Bridges with [employer]  Strengthening;Both;1 set;10 reps      Knee/Hip Exercises: Sidelying   Clams  blue x 15       Modalities   Modalities  Moist Heat      Moist Heat Therapy   Number Minutes  Moist Heat  10 Minutes    Moist Heat Location  Hip             PT Education - 12/21/18 1252    Education Details  HEP, hip stretching    Person(s) Educated  Patient    Methods  Explanation;Handout    Comprehension  Verbalized understanding;Returned demonstration          PT Long Term Goals - 12/21/18 1255      PT LONG TERM GOAL #1   Title  Pt will be I with HEP    Status  On-going      PT LONG TERM GOAL #2   Title  FOTO score will decrease to < 25% limited to demo functional improvement    Status  On-going      PT LONG TERM GOAL #3   Title  Pt will be able to sit to stand with less hip pain (<2/10) and without the need to sway, shift weight when standing for ADLs, home tasks    Status  On-going      PT LONG TERM GOAL #4   Title  Pt will report no pain increase  following 30 min walk, near a baseline of 1/10- 2/10 after stretching    Status  On-going      PT LONG TERM GOAL #5   Title  L hip strength will be equal to Rt hip in flexion, abduction and extension    Status  On-going            Plan - 12/21/18 1259    Clinical Impression Statement  Offered new ways of stretching and added light strengthening for lateral hip and glutes.  No increased pain.  LLE tighter in sitting for crossing her legs. goals in progress.    PT Treatment/Interventions  ADLs/Self Care Home Management;Electrical Stimulation;Therapeutic activities;Patient/family education;Taping;Therapeutic exercise;Iontophoresis 4mg /ml Dexamethasone;Moist Heat;Ultrasound;Cryotherapy;Functional mobility training;Gait training;Stair training;Balance training;Neuromuscular re-education;Manual techniques;Dry needling;Passive range of motion    PT Next Visit Plan  check hip strength, work on balance    PT Home Exercise Plan  stretching: ant hip, hamstring, piriformis, knee to chest    Consulted and Agree with Plan of Care  Patient       Patient will benefit from skilled therapeutic intervention in order to improve the following deficits and impairments:  Impaired flexibility, Obesity, Decreased strength, Decreased range of motion, Pain, Improper body mechanics, Increased fascial restricitons, Decreased mobility  Visit Diagnosis: Pain in left hip  Stiffness of left hip, not elsewhere classified  Muscle weakness (generalized)     Problem List Patient Active Problem List   Diagnosis Date Noted  . Encounter for removal of biliary stent   . Anxiety and depression 02/01/2017  . Bile duct leak   . Acute calculous cholecystitis 12/13/2016  . Jaundice 12/13/2016  . Obesity 12/13/2016  . Hypothyroidism   . Gallstone pancreatitis 12/10/2016    Andrea Blackwell 12/21/2018, 1:03 PM  [facility] [address] [place], [place],  [address] Phone: [phone]   Fax:  [phone]  Name: Andrea Blackwell MRN: [id number] Date of Birth: 1955-05-13  Raeford Razor, PT 12/21/18 1:03 PM Phone: [phone] Fax: [phone]

## 2018-12-24 ENCOUNTER — Ambulatory Visit: Payer: Self-pay | Admitting: Physical Therapy

## 2018-12-27 ENCOUNTER — Ambulatory Visit: Payer: Self-pay | Admitting: Physical Therapy

## 2018-12-31 ENCOUNTER — Ambulatory Visit: Payer: Self-pay | Admitting: Physical Therapy

## 2019-01-02 ENCOUNTER — Ambulatory Visit: Payer: Self-pay | Admitting: Physical Therapy

## 2019-01-07 ENCOUNTER — Ambulatory Visit: Payer: Self-pay | Admitting: Physical Therapy

## 2019-01-07 ENCOUNTER — Telehealth: Payer: Self-pay | Admitting: Physical Therapy

## 2019-01-07 ENCOUNTER — Encounter: Payer: Self-pay | Admitting: Physical Therapy

## 2019-01-07 NOTE — Telephone Encounter (Signed)
Called patient at 10:15 regarding her missed appt.  She apologized and simply got the times mixed up. She plans to be here at her next appt. Dec. 3rd at 2:00 pm.   She also stated she was tested for COVID due to having a sore throat but she tested negative. That test was several days ago and she is feeling better.   Raeford Razor, PT 01/07/19 10:25 AM Phone: [phone] Fax: [phone]

## 2019-01-08 ENCOUNTER — Other Ambulatory Visit: Payer: Self-pay

## 2019-01-08 DIAGNOSIS — Z20822 Contact with and (suspected) exposure to covid-19: Secondary | ICD-10-CM

## 2019-01-09 LAB — NOVEL CORONAVIRUS, NAA: SARS-CoV-2, NAA: NOT DETECTED

## 2019-01-10 ENCOUNTER — Ambulatory Visit: Payer: Self-pay | Attending: Nurse Practitioner | Admitting: Physical Therapy

## 2019-01-10 ENCOUNTER — Other Ambulatory Visit: Payer: Self-pay

## 2019-01-10 ENCOUNTER — Encounter: Payer: Self-pay | Admitting: Physical Therapy

## 2019-01-10 DIAGNOSIS — G8929 Other chronic pain: Secondary | ICD-10-CM | POA: Insufficient documentation

## 2019-01-10 DIAGNOSIS — M25652 Stiffness of left hip, not elsewhere classified: Secondary | ICD-10-CM | POA: Insufficient documentation

## 2019-01-10 DIAGNOSIS — M25512 Pain in left shoulder: Secondary | ICD-10-CM | POA: Insufficient documentation

## 2019-01-10 DIAGNOSIS — M6281 Muscle weakness (generalized): Secondary | ICD-10-CM | POA: Insufficient documentation

## 2019-01-10 DIAGNOSIS — M62838 Other muscle spasm: Secondary | ICD-10-CM | POA: Insufficient documentation

## 2019-01-10 DIAGNOSIS — M25552 Pain in left hip: Secondary | ICD-10-CM | POA: Insufficient documentation

## 2019-01-10 NOTE — Patient Instructions (Signed)
Access Code: [email or web address]  URL: [email or web address]  Date: 01/10/2019  Prepared by: Raeford Razor   Exercises  Single Leg Balance on Balance Trainer - 10 reps - 1 sets - 30 hold - 1x daily - 7x weekly  Tandem Stance with Support - 3 reps - 1 sets - 30 hold - 1x daily - 7x weekly  Walking Tandem Stance - 3 reps - 1 sets - 30 hold - 1x daily - 7x weekly  Romberg Stance - 3 reps - 1 sets - 30 hold - 1x daily - 7x weekly

## 2019-01-11 NOTE — Therapy (Signed)
[place], [place], [address] Phone: [phone]   Fax:  [phone]  Physical Therapy Treatment  Patient Details  Name: Andrea Blackwell MRN: [id number] Date of Birth: 04/05/1955 Referring Provider (PT): Dr. [name]    Encounter Date: 01/10/2019  PT End of Session - 01/10/19 1443    Visit Number  3    Number of Visits  12    Date for PT Re-Evaluation  01/25/19    PT Start Time  1400    PT Stop Time  1450    PT Time Calculation (min)  50 min    Activity Tolerance  Patient tolerated treatment well    Behavior During Therapy  [facility] for tasks assessed/performed       Past Medical History:  Diagnosis Date  . Allergy   . Anxiety   . Cataract   . Depression   . Diabetes mellitus without complication ([place])   . GERD (gastroesophageal reflux disease)   . Hypothyroidism   . Pancreatitis     Past Surgical History:  Procedure Laterality Date  . CHOLECYSTECTOMY    . ENDOSCOPIC RETROGRADE CHOLANGIOPANCREATOGRAPHY (ERCP) WITH PROPOFOL N/A 02/02/2017   Procedure: ENDOSCOPIC RETROGRADE CHOLANGIOPANCREATOGRAPHY (ERCP) WITH PROPOFOL;  Surgeon: Milus Banister, MD;  [facility]: [facility];  Service: Endoscopy;  Laterality: N/A;  . ERCP N/A 12/10/2016   Procedure: ENDOSCOPIC RETROGRADE CHOLANGIOPANCREATOGRAPHY (ERCP);  Surgeon: Carol Ada, MD;  Location: [facility];  Service: Endoscopy;  Laterality: N/A;  . ERCP N/A 12/14/2016   Procedure: ENDOSCOPIC RETROGRADE CHOLANGIOPANCREATOGRAPHY (ERCP);  Surgeon: Milus Banister, MD;  Location: [facility];  Service: Endoscopy;  Laterality: N/A;  . LAPAROSCOPIC CHOLECYSTECTOMY SINGLE SITE WITH INTRAOPERATIVE CHOLANGIOGRAM N/A 12/13/2016   Procedure: LAPAROSCOPIC CHOLECYSTECTOMY SINGLE SITE AND LIVER BIOPSY;  Surgeon: Michael Boston, MD;  Location: [facility];  Service: General;  Laterality: N/A;  . OVARY SURGERY      There were no vitals filed for this visit.  Subjective  Assessment - 01/10/19 1413    Subjective  Hip pain is 1/10.  I think I had COVID.  I was neg x 2         [facility] PT Assessment - 01/10/19 0001      Berg Balance Test   Sit to Stand  Able to stand without using hands and stabilize independently    Standing Unsupported  Able to stand safely 2 minutes    Sitting with Back Unsupported but Feet Supported on Floor or Stool  Able to sit safely and securely 2 minutes    Stand to Sit  Sits safely with minimal use of hands    Transfers  Able to transfer safely, minor use of hands    Standing Unsupported with Eyes Closed  Able to stand 10 seconds safely    Standing Unsupported with Feet Together  Able to place feet together independently and stand 1 minute safely    From Standing, Reach Forward with Outstretched Arm  Can reach confidently >25 cm (10")    From Standing Position, Pick up Object from Floor  Able to pick up shoe safely and easily    From Standing Position, Turn to Look Behind Over each Shoulder  Looks behind from both sides and weight shifts well    Turn 360 Degrees  Able to turn 360 degrees safely in 4 seconds or less    Standing Unsupported, Alternately Place Feet on Step/Stool  Able to stand independently and safely and complete 8 steps in 20  seconds    Standing Unsupported, One Foot in Front  Able to plae foot ahead of the other independently and hold 30 seconds    Standing on One Leg  Able to lift leg independently and hold 5-10 seconds    Total Score  54      Dynamic Gait Index   Level Surface  Normal    Change in Gait Speed  Normal    Gait with Horizontal Head Turns  Mild Impairment    Gait with Vertical Head Turns  Normal    Gait and Pivot Turn  Mild Impairment    Step Over Obstacle  Normal    Step Around Obstacles  Normal    Steps  Normal    Total Score  22    DGI comment:  unsteady as she turns and stops, looks down when she walks       Timed Up and Go Test   Normal TUG (seconds)  8       [facility] Adult PT  Treatment/Exercise - 01/11/19 0001      High Level Balance   High Level Balance Activities  Braiding;Head turns;Tandem walking    High Level Balance Comments  tandem , EC, EO      Knee/Hip Exercises: Stretches   Piriformis Stretch  Both;3 reps;30 seconds    Piriformis Stretch Limitations  seated       Knee/Hip Exercises: Aerobic   Recumbent Bike  L2 for 6 min           PT Long Term Goals - 12/21/18 1255      PT LONG TERM GOAL #1   Title  Pt will be I with HEP    Status  On-going      PT LONG TERM GOAL #2   Title  FOTO score will decrease to < 25% limited to demo functional improvement    Status  On-going      PT LONG TERM GOAL #3   Title  Pt will be able to sit to stand with less hip pain (<2/10) and without the need to sway, shift weight when standing for ADLs, home tasks    Status  On-going      PT LONG TERM GOAL #4   Title  Pt will report no pain increase following 30 min walk, near a baseline of 1/10- 2/10 after stretching    Status  On-going      PT LONG TERM GOAL #5   Title  L hip strength will be equal to Rt hip in flexion, abduction and extension    Status  On-going            Plan - 01/11/19 [id number]    Clinical Impression Statement  Patient with decreased pain in hips. She has been less active as she was sick.  Was tested for COVID x2 but neg. We did a few balance tests today as she does feel unsteady at times and does not want to fall again. SHe moves quickly and smoothly unless she changes direction abruptly.  Likely distracted. She had some degree of difficulty with single leg, tandem and head movements.  Was given HEP to address.  Will benefit from skilled PT to further improve hip pain and address balance concerns.    PT Treatment/Interventions  ADLs/Self Care Home Management;Electrical Stimulation;Therapeutic activities;Patient/family education;Taping;Therapeutic exercise;Iontophoresis 4mg /ml Dexamethasone;Moist Heat;Ultrasound;Cryotherapy;Functional  mobility training;Gait training;Stair training;Balance training;Neuromuscular re-education;Manual techniques;Dry needling;Passive range of motion    PT Next Visit Plan  check hip strength, work  on balance    PT Home Exercise Plan  stretching: ant hip, hamstring, piriformis, knee to chest, balance ex       Patient will benefit from skilled therapeutic intervention in order to improve the following deficits and impairments:  Impaired flexibility, Obesity, Decreased strength, Decreased range of motion, Pain, Improper body mechanics, Increased fascial restricitons, Decreased mobility  Visit Diagnosis: Pain in left hip  Stiffness of left hip, not elsewhere classified  Muscle weakness (generalized)     Problem List Patient Active Problem List   Diagnosis Date Noted  . Encounter for removal of biliary stent   . Anxiety and depression 02/01/2017  . Bile duct leak   . Acute calculous cholecystitis 12/13/2016  . Jaundice 12/13/2016  . Obesity 12/13/2016  . Hypothyroidism   . Gallstone pancreatitis 12/10/2016    Andrea Blackwell 01/11/2019, 6:14 AM  [place], [place], [address] Phone: [phone]   Fax:  [phone]  Name: Andrea Blackwell MRN: [id number] Date of Birth: 10/15/1955  Raeford Razor, PT 01/11/19 6:14 AM Phone: [phone] Fax: [phone]

## 2019-01-14 ENCOUNTER — Other Ambulatory Visit: Payer: Self-pay

## 2019-01-14 ENCOUNTER — Ambulatory Visit: Payer: Self-pay | Admitting: Physical Therapy

## 2019-01-14 ENCOUNTER — Encounter: Payer: Self-pay | Admitting: Physical Therapy

## 2019-01-14 DIAGNOSIS — M25652 Stiffness of left hip, not elsewhere classified: Secondary | ICD-10-CM

## 2019-01-14 DIAGNOSIS — M25552 Pain in left hip: Secondary | ICD-10-CM

## 2019-01-14 DIAGNOSIS — M6281 Muscle weakness (generalized): Secondary | ICD-10-CM

## 2019-01-14 NOTE — Therapy (Signed)
[place], [place], [address] Phone: [phone]   Fax:  [phone]  Physical Therapy Treatment  Patient Details  Name: Andrea Blackwell MRN: [id number] Date of Birth: [birth year] Referring Provider (PT): Dr. [name]    Encounter Date: 01/14/2019  PT End of Session - 01/14/19 1017    Visit Number  4    Number of Visits  12    Date for PT Re-Evaluation  01/25/19    PT Start Time  1015   pt late , appt was 10:00   PT Stop Time  1048    PT Time Calculation (min)  33 min    Activity Tolerance  Patient tolerated treatment well    Behavior During Therapy  [facility] for tasks assessed/performed       Past Medical History:  Diagnosis Date  . Allergy   . Anxiety   . Cataract   . Depression   . Diabetes mellitus without complication ([place])   . GERD (gastroesophageal reflux disease)   . Hypothyroidism   . Pancreatitis     Past Surgical History:  Procedure Laterality Date  . CHOLECYSTECTOMY    . ENDOSCOPIC RETROGRADE CHOLANGIOPANCREATOGRAPHY (ERCP) WITH PROPOFOL N/A 02/02/2017   Procedure: ENDOSCOPIC RETROGRADE CHOLANGIOPANCREATOGRAPHY (ERCP) WITH PROPOFOL;  Surgeon: Milus Banister, MD;  Location: [facility];  Service: Endoscopy;  Laterality: N/A;  . ERCP N/A 12/10/2016   Procedure: ENDOSCOPIC RETROGRADE CHOLANGIOPANCREATOGRAPHY (ERCP);  Surgeon: Carol Ada, MD;  Location: [facility];  Service: Endoscopy;  Laterality: N/A;  . ERCP N/A 12/14/2016   Procedure: ENDOSCOPIC RETROGRADE CHOLANGIOPANCREATOGRAPHY (ERCP);  Surgeon: Milus Banister, MD;  Location: [facility];  Service: Endoscopy;  Laterality: N/A;  . LAPAROSCOPIC CHOLECYSTECTOMY SINGLE SITE WITH INTRAOPERATIVE CHOLANGIOGRAM N/A 12/13/2016   Procedure: LAPAROSCOPIC CHOLECYSTECTOMY SINGLE SITE AND LIVER BIOPSY;  Surgeon: Michael Boston, MD;  Location: [facility];  Service: General;  Laterality: N/A;  . OVARY SURGERY      There were no vitals filed for  this visit.  Subjective Assessment - 01/14/19 1014    Subjective  Hip was hurting yesterday, was a 3/10.  2/10 now.    Currently in Pain?  Yes    Pain Score  2     Pain Location  Hip    Pain Orientation  Left    Pain Descriptors / Indicators  Aching    Pain Type  Chronic pain    Pain Onset  More than a month ago    Aggravating Factors   eating sugar, not moving    Pain Relieving Factors  changing positions         [facility] Adult PT Treatment/Exercise - 01/14/19 0001      Knee/Hip Exercises: Stretches   Piriformis Stretch  Both;3 reps;30 seconds    Piriformis Stretch Limitations  supine       Knee/Hip Exercises: Aerobic   Nustep  6 min L 6 UE and LE       Knee/Hip Exercises: Standing   Other Standing Knee Exercises  hip hinge with and without 10 lb KB.  Multiple props used for this.     Other Standing Knee Exercises  Lateral band walks green band x 8 feet x 4       Knee/Hip Exercises: Sidelying   Hip ABduction  Strengthening;Both;1 set;15 reps    Clams  x 15    Other Sidelying Knee/Hip Exercises  abd hold with knee ext.  x 15  PT Long Term Goals - 12/21/18 1255      PT LONG TERM GOAL #1   Title  Pt will be I with HEP    Status  On-going      PT LONG TERM GOAL #2   Title  FOTO score will decrease to < 25% limited to demo functional improvement    Status  On-going      PT LONG TERM GOAL #3   Title  Pt will be able to sit to stand with less hip pain (<2/10) and without the need to sway, shift weight when standing for ADLs, home tasks    Status  On-going      PT LONG TERM GOAL #4   Title  Pt will report no pain increase following 30 min walk, near a baseline of 1/10- 2/10 after stretching    Status  On-going      PT LONG TERM GOAL #5   Title  L hip strength will be equal to Rt hip in flexion, abduction and extension    Status  On-going            Plan - 01/14/19 1155    Clinical Impression Statement  Patient was late today, able to work on  hip strengthening without increasing pain. Increased time spent on hip hinging per her request.  Able to do with min cues.    PT Treatment/Interventions  ADLs/Self Care Home Management;Electrical Stimulation;Therapeutic activities;Patient/family education;Taping;Therapeutic exercise;Iontophoresis 4mg /ml Dexamethasone;Moist Heat;Ultrasound;Cryotherapy;Functional mobility training;Gait training;Stair training;Balance training;Neuromuscular re-education;Manual techniques;Dry needling;Passive range of motion    PT Next Visit Plan  cont hip and core, incorporate balance    PT Home Exercise Plan  stretching: ant hip, hamstring, piriformis, knee to chest, balance ex    Consulted and Agree with Plan of Care  Patient       Patient will benefit from skilled therapeutic intervention in order to improve the following deficits and impairments:  Impaired flexibility, Obesity, Decreased strength, Decreased range of motion, Pain, Improper body mechanics, Increased fascial restricitons, Decreased mobility  Visit Diagnosis: Pain in left hip  Stiffness of left hip, not elsewhere classified  Muscle weakness (generalized)     Problem List Patient Active Problem List   Diagnosis Date Noted  . Encounter for removal of biliary stent   . Anxiety and depression 02/01/2017  . Bile duct leak   . Acute calculous cholecystitis 12/13/2016  . Jaundice 12/13/2016  . Obesity 12/13/2016  . Hypothyroidism   . Gallstone pancreatitis 12/10/2016    , 01/14/2019, 12:00 PM  [facility] [address] [place], [place], [address] Phone: [phone]   Fax:  [phone]  Name: Andrea Blackwell MRN: [id number] Date of Birth: [birth year]   Raeford Razor, PT 01/14/19 12:00 PM Phone: [phone] Fax: [phone]

## 2019-01-17 ENCOUNTER — Ambulatory Visit: Payer: Self-pay | Attending: Nurse Practitioner | Admitting: Physician Assistant

## 2019-01-17 ENCOUNTER — Other Ambulatory Visit: Payer: Self-pay

## 2019-01-17 ENCOUNTER — Ambulatory Visit: Payer: Self-pay | Admitting: Physical Therapy

## 2019-01-17 DIAGNOSIS — J028 Acute pharyngitis due to other specified organisms: Secondary | ICD-10-CM

## 2019-01-17 DIAGNOSIS — B9789 Other viral agents as the cause of diseases classified elsewhere: Secondary | ICD-10-CM

## 2019-01-17 NOTE — Progress Notes (Signed)
Patient ID: Andrea Blackwell, female   DOB: [birth year], 63 y.o.   MRN: [id number] Virtual Visit via Telephone Note  I connected with Jenetta Loges on 01/17/19 at  9:50 AM EST by telephone and verified that I am speaking with the correct person using two identifiers.   I discussed the limitations, risks, security and privacy concerns of performing an evaluation and management service by telephone and the availability of in person appointments. I also discussed with the patient that there may be a patient responsible charge related to this service. The patient expressed understanding and agreed to proceed.  Patient location:  home My Location:  [facility] office Persons on the call:  Me and the patient   History of Present Illness:  11/17 felt very sick-horrible ST and in bed for 4 days.  Some cough then too.  tested neg for Covid elsewhere at that time.  No fever.  Loss sense of taste for a few days.  ST has improved but still there.  Very exhausted.  Sleeping more than normal.  Feels colder than normal.  Feels ok then feels awful and tired. She is overall feeling better .  Her sister tested positive for Covid within a week of being around her.  She denies SOB or respiratory distress.  She took another Covid test 12/1 that was negative.  She does feel she is improving overall.     Observations/Objective:  NAD.  A&Ox3   Assessment and Plan: 1. Sore throat due to virus Likely resolving Covid.  She will cancel appt if she is well by the time of the appt.  Salt water gargles, cold air humidifier, tylenol or advil as needed.  She is overall improving and I doubt she will need this appt- - Ambulatory referral to ENT    Follow Up Instructions: See PCP prn   I discussed the assessment and treatment plan with the patient. The patient was provided an opportunity to ask questions and all were answered. The patient agreed with the plan and demonstrated an understanding of the instructions.   The patient was advised to  call back or seek an in-person evaluation if the symptoms worsen or if the condition fails to improve as anticipated.  I provided 15 minutes of non-face-to-face time during this encounter.   Freeman Caldron, PA-C

## 2019-01-21 ENCOUNTER — Ambulatory Visit: Payer: Self-pay | Admitting: Physical Therapy

## 2019-01-24 ENCOUNTER — Ambulatory Visit: Payer: Self-pay | Admitting: Physical Therapy

## 2019-01-29 ENCOUNTER — Ambulatory Visit: Payer: Self-pay | Admitting: Physical Therapy

## 2019-01-29 ENCOUNTER — Other Ambulatory Visit: Payer: Self-pay

## 2019-02-04 ENCOUNTER — Ambulatory Visit: Payer: Self-pay | Admitting: Physical Therapy

## 2019-02-04 ENCOUNTER — Telehealth: Payer: Self-pay | Admitting: Physical Therapy

## 2019-02-04 NOTE — Telephone Encounter (Signed)
Pt was called this AM prior to her appt at 10:00 as last time she arrived not feeling well.  She is at the end of her PT Plan of care and I thought we could discuss plans going forward without her needing to come in , in case she still felt sick.  She has had 2 neg. COVID tests but was having symptoms for about 2-3 weeks.  She did not show for this appt. Today at 10:00.  I left a message to have her call me back.  Her next appt is Wed at 11:30 and if she does not come to it, she will be discharged.    Raeford Razor, PT 02/04/19 10:18 AM Phone: [phone] Fax: [phone]

## 2019-02-06 ENCOUNTER — Other Ambulatory Visit: Payer: Self-pay | Admitting: Nurse Practitioner

## 2019-02-06 ENCOUNTER — Other Ambulatory Visit: Payer: Self-pay

## 2019-02-06 ENCOUNTER — Ambulatory Visit: Payer: Self-pay | Admitting: Physical Therapy

## 2019-02-06 ENCOUNTER — Encounter: Payer: Self-pay | Admitting: Physical Therapy

## 2019-02-06 DIAGNOSIS — G8929 Other chronic pain: Secondary | ICD-10-CM

## 2019-02-06 DIAGNOSIS — M62838 Other muscle spasm: Secondary | ICD-10-CM

## 2019-02-06 DIAGNOSIS — M25652 Stiffness of left hip, not elsewhere classified: Secondary | ICD-10-CM

## 2019-02-06 DIAGNOSIS — M25552 Pain in left hip: Secondary | ICD-10-CM

## 2019-02-06 DIAGNOSIS — R0989 Other specified symptoms and signs involving the circulatory and respiratory systems: Secondary | ICD-10-CM

## 2019-02-06 DIAGNOSIS — M6281 Muscle weakness (generalized): Secondary | ICD-10-CM

## 2019-02-06 MED FILL — LEVOTHYROXINE 150 MCG TAB: 150 | 30 days supply | Qty: 30 | Fill #1

## 2019-02-06 MED FILL — ESCITALOPRAM 20 MG TABLET: 20 | 30 days supply | Qty: 30 | Fill #1

## 2019-02-06 NOTE — Therapy (Signed)
[place], [place], [address] Phone: [phone]   Fax:  [phone]  Physical Therapy Treatment/Discharge  Patient Details  Name: Andrea Blackwell MRN: [id number] Date of Birth: 1955-08-20 Referring Provider (PT): Dr. [name]    Encounter Date: 02/06/2019  PT End of Session - 02/06/19 1241    Visit Number  5    Number of Visits  12    PT Start Time  0630    PT Stop Time  1245    PT Time Calculation (min)  30 min    Activity Tolerance  Patient tolerated treatment well    Behavior During Therapy  [facility] for tasks assessed/performed       Past Medical History:  Diagnosis Date  . Allergy   . Anxiety   . Cataract   . Depression   . Diabetes mellitus without complication ([place])   . GERD (gastroesophageal reflux disease)   . Hypothyroidism   . Pancreatitis     Past Surgical History:  Procedure Laterality Date  . CHOLECYSTECTOMY    . ENDOSCOPIC RETROGRADE CHOLANGIOPANCREATOGRAPHY (ERCP) WITH PROPOFOL N/A 02/02/2017   Procedure: ENDOSCOPIC RETROGRADE CHOLANGIOPANCREATOGRAPHY (ERCP) WITH PROPOFOL;  Surgeon: Milus Banister, MD;  Location: [facility];  Service: Endoscopy;  Laterality: N/A;  . ERCP N/A 12/10/2016   Procedure: ENDOSCOPIC RETROGRADE CHOLANGIOPANCREATOGRAPHY (ERCP);  Surgeon: Carol Ada, MD;  Location: [facility];  Service: Endoscopy;  Laterality: N/A;  . ERCP N/A 12/14/2016   Procedure: ENDOSCOPIC RETROGRADE CHOLANGIOPANCREATOGRAPHY (ERCP);  Surgeon: Milus Banister, MD;  Location: [facility];  Service: Endoscopy;  Laterality: N/A;  . LAPAROSCOPIC CHOLECYSTECTOMY SINGLE SITE WITH INTRAOPERATIVE CHOLANGIOGRAM N/A 12/13/2016   Procedure: LAPAROSCOPIC CHOLECYSTECTOMY SINGLE SITE AND LIVER BIOPSY;  Surgeon: Michael Boston, MD;  Location: [facility];  Service: General;  Laterality: N/A;  . OVARY SURGERY      There were no vitals filed for this visit.  Subjective Assessment - 02/06/19 1215    Subjective  I miss my routine Andrea Blackwell been staying at a friends house dog sitting.  I am really tired.  I have been eating too much.  I need my routine.    Currently in Pain?  Yes    Pain Score  2     Pain Location  Hip    Pain Orientation  Left    Pain Descriptors / Indicators  Aching    Pain Type  Chronic pain    Pain Onset  More than a month ago    Pain Frequency  Intermittent    Aggravating Factors   sitting still    Pain Relieving Factors  movement    Multiple Pain Sites  No           [facility] Adult PT Treatment/Exercise - 02/06/19 0001      Self-Care   Self-Care  Other Self-Care Comments    Other Self-Care Comments   HEP routine, balance, goals, FOTO, New Yr resolutions       Knee/Hip Exercises: Stretches   Active Hamstring Stretch  3 reps    Piriformis Stretch  Both;3 reps;30 seconds    Piriformis Stretch Limitations  supine and seated     Gastroc Stretch  2 reps;30 seconds    Gastroc Stretch Limitations  wall       Knee/Hip Exercises: Standing   Other Standing Knee Exercises  balance: SLS, tandem with EO, EC and slow head turns for HEP review         PT Education - 02/06/19  1223    Education Details  final HEP and DC    Person(s) Educated  Patient    Methods  Explanation    Comprehension  Verbalized understanding          PT Long Term Goals - 02/06/19 1234      PT LONG TERM GOAL #1   Title  Pt will be I with HEP    Status  Achieved      PT LONG TERM GOAL #2   Title  FOTO score will decrease to < 25% limited to demo functional improvement    Status  Achieved      PT LONG TERM GOAL #3   Title  Pt will be able to sit to stand with less hip pain (<2/10) and without the need to sway, shift weight when standing for ADLs, home tasks    Status  Achieved      PT LONG TERM GOAL #4   Title  Pt will report no pain increase following 30 min walk, near a baseline of 1/10- 2/10 after stretching    Baseline  has been unable to do as she is staying in a different  house    Status  Not Met      PT LONG TERM GOAL #5   Title  L hip strength will be equal to Rt hip in flexion, abduction and extension    Status  Achieved            Plan - 02/06/19 1251    Clinical Impression Statement  Patient is pleased with how her hip feels, has not had pain >3/10 in several weeks.  Will contact us in the future if she has further needs.    PT Treatment/Interventions  ADLs/Self Care Home Management;Electrical Stimulation;Therapeutic activities;Patient/family education;Taping;Therapeutic exercise;Iontophoresis 37m/ml Dexamethasone;Moist Heat;Ultrasound;Cryotherapy;Functional mobility training;Gait training;Stair training;Balance training;Neuromuscular re-education;Manual techniques;Dry needling;Passive range of motion    PT Next Visit Plan  DC    PT Home Exercise Plan  stretching: ant hip, hamstring, piriformis, knee to chest, balance ex    Consulted and Agree with Plan of Care  Patient       Patient will benefit from skilled therapeutic intervention in order to improve the following deficits and impairments:  Impaired flexibility, Obesity, Decreased strength, Decreased range of motion, Pain, Improper body mechanics, Increased fascial restricitons, Decreased mobility  Visit Diagnosis: Pain in left hip  Stiffness of left hip, not elsewhere classified  Muscle weakness (generalized)  Chronic left shoulder pain  Other muscle spasm     Problem List Patient Active Problem List   Diagnosis Date Noted  . Encounter for removal of biliary stent   . Anxiety and depression 02/01/2017  . Bile duct leak   . Acute calculous cholecystitis 12/13/2016  . Jaundice 12/13/2016  . Obesity 12/13/2016  . Hypothyroidism   . Gallstone pancreatitis 12/10/2016    Andrea Blackwell 02/06/2019, 12:55 PM  [facility] [address]Phone: [phone]  Fax:  [phone] Name: ACrystall DonaldsonMRN:  [id number] of Birth: 408-20-1957  .PHYSICAL THERAPY DISCHARGE SUMMARY  Visits from Start of Care: 5  Current functional level related to goals / functional outcomes: See above    Remaining deficits: None limiting function   Education / Equipment: HEP, posture Plan: Patient agrees to discharge.  Patient goals were met. Patient is being discharged due to meeting the stated rehab goals.  ?????      JRaeford Razor PT 02/06/19  12:55 PM Phone: [phone] Fax: [phone]

## 2019-02-07 MED FILL — FLUTICASONE PROP 50 MCG SPR: 50 | 30 days supply | Qty: 16 | Fill #0

## 2019-04-16 ENCOUNTER — Other Ambulatory Visit: Payer: Self-pay

## 2019-04-16 ENCOUNTER — Ambulatory Visit: Payer: Self-pay

## 2019-04-17 MED FILL — LEVOTHYROXINE SODIUM 150 MC: 150 | 30 days supply | Qty: 30 | Fill #2

## 2019-04-17 MED FILL — ESCITALOPRAM 20 MG TABLET: 20 | 30 days supply | Qty: 30 | Fill #2

## 2019-04-30 ENCOUNTER — Ambulatory Visit: Payer: Self-pay | Admitting: Nurse Practitioner

## 2019-05-29 NOTE — Progress Notes (Signed)
Patient ID: Andrea Blackwell, female    DOB: 1955/03/13  MRN: [id number]  CC: Ear Issues  Subjective: Andrea Blackwell is a 64 y.o. female with history of gallstone pancreatitis, acute calculous cholecystitis, bile duct leak, hypothyroidism, jaundice, obesity, and anxiety and depressionwho presents for ear issues.  1. EAR ISSUES:  Location: bilateral Description: hearing loss Onset:  At least 1 year ago. Feels like hearing loss snuck up on her because everyone is wearing masks because of the pandemic. She attributed hearing issues with being unable to see a persons mouth when they are speaking. Reports that she has to ask people to remove their mask when speaking to her so she can lip read move which helps some.  Symptoms  Sensation of fullness: yes, feels muffled and sounds like someone is playing a muffled radio all of the time Ear discharge: denies URI symptoms: denies Fever: denies  Tinnitus: denies Dizziness: denies Headaches: denies Hearing loss:  yes, bilateral Popping/clicking sounds: denies Sensation of foreign body in ear: denies  Toothache: yes, from sinuses a few weeks ago Rashes or lesions: denies Facial muscle weakness: denies  Red Flags Recent trauma: denies PMH prior ear surgery: denies   Diabetes or Immunosuppresion: denies   Comments: Reports she is talking louder than usual. 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 She is currently still in training and reports that she is concerned that she will not be able to hear what they are saying when the time comes for her to work alone and wear the headset. Also concerns because her mother and maternal grandfather went deaf around the age that she is now. Has history of one ear infection as a child and none since then. States she does use Q-tips to clean her ears. States she has never been to an Dance movement psychotherapist.  Patient Active Problem List   Diagnosis Date  Noted  . Encounter for removal of biliary stent   . Anxiety and depression 02/01/2017  . Bile duct leak   . Acute calculous cholecystitis 12/13/2016  . Jaundice 12/13/2016  . Obesity 12/13/2016  . Hypothyroidism   . Gallstone pancreatitis 12/10/2016     Current Outpatient Medications on File Prior to Visit  Medication Sig Dispense Refill  . diclofenac sodium (VOLTAREN) 1 % GEL Apply topically 4 (four) times daily.    Marland Kitchen escitalopram (LEXAPRO) 20 MG tablet Take 1 tablet (20 mg total) by mouth at bedtime. 90 tablet 1  . fluticasone (FLONASE) 50 MCG/ACT nasal spray PLACE 2 SPRAYS INTO BOTH NOSTRILS DAILY. 16 g 6  . levothyroxine (SYNTHROID) 150 MCG tablet Take 1 tablet (150 mcg total) by mouth daily before breakfast. Please mail 90 tablet 0  . omega-3 acid ethyl esters (LOVAZA) 1 g capsule Take 2 capsules (2 g total) by mouth 2 (two) times daily. 120 capsule 2  . Probiotic CAPS Take 1 capsule by mouth daily.    Marland Kitchen triamcinolone ointment (KENALOG) 0.5 % Apply 1 application topically 2 (two) times daily. (Patient not taking: Reported on 12/10/2018) 30 g 0   No current facility-administered medications on file prior to visit.    Allergies  Allergen Reactions  . Penicillins Anaphylaxis and Other (See Comments)    Has patient had a PCN reaction causing immediate rash, facial/tongue/throat swelling, SOB or lightheadedness with hypotension: yes Has patient had a PCN reaction causing severe rash involving mucus membranes or skin necrosis: no Has  patient had a PCN reaction that required hospitalization: yes Has patient had a PCN reaction occurring within the last 10 years: no If all of the above answers are "NO", then may proceed with Cephalosporin use.     Social History   Socioeconomic History  . Marital status: Divorced    Spouse name: Not on file  . Number of children: Not on file  . Years of education: Not on file  . Highest education level: Not on file  Occupational History  . Not  on file  Tobacco Use  . Smoking status: Former Smoker    Quit date: 1995    Years since quitting: 26.3  . Smokeless tobacco: Never Used  Substance and Sexual Activity  . Alcohol use: Yes    Comment: occasional  . Drug use: No  . Sexual activity: Never  Other Topics Concern  . Not on file  Social History Narrative  . Not on file   Social Determinants of Health   Financial Resource Strain:   . Difficulty of Paying Living Expenses:   Food Insecurity:   . Worried About Charity fundraiser in the Last Year:   . Arboriculturist in the Last Year:   Transportation Needs:   . Film/video editor (Medical):   Marland Kitchen Lack of Transportation (Non-Medical):   Physical Activity:   . Days of Exercise per Week:   . Minutes of Exercise per Session:   Stress:   . Feeling of Stress :   Social Connections:   . Frequency of Communication with Friends and Family:   . Frequency of Social Gatherings with Friends and Family:   . Attends Religious Services:   . Active Member of Clubs or Organizations:   . Attends Archivist Meetings:   Marland Kitchen Marital Status:   Intimate Partner Violence:   . Fear of Current or Ex-Partner:   . Emotionally Abused:   Marland Kitchen Physically Abused:   . Sexually Abused:     Family History  Problem Relation Age of Onset  . Diabetes Maternal Grandfather   . Colon cancer Neg Hx   . Esophageal cancer Neg Hx   . Liver cancer Neg Hx   . Pancreatic cancer Neg Hx   . Rectal cancer Neg Hx   . Stomach cancer Neg Hx   . Breast cancer Neg Hx   . Prostate cancer Neg Hx     Past Surgical History:  Procedure Laterality Date  . CHOLECYSTECTOMY    . ENDOSCOPIC RETROGRADE CHOLANGIOPANCREATOGRAPHY (ERCP) WITH PROPOFOL N/A 02/02/2017   Procedure: ENDOSCOPIC RETROGRADE CHOLANGIOPANCREATOGRAPHY (ERCP) WITH PROPOFOL;  Surgeon: Milus Banister, MD;  Location: [facility];  Service: Endoscopy;  Laterality: N/A;  . ERCP N/A 12/10/2016   Procedure: ENDOSCOPIC RETROGRADE  CHOLANGIOPANCREATOGRAPHY (ERCP);  Surgeon: Carol Ada, MD;  Location: [facility];  Service: Endoscopy;  Laterality: N/A;  . ERCP N/A 12/14/2016   Procedure: ENDOSCOPIC RETROGRADE CHOLANGIOPANCREATOGRAPHY (ERCP);  Surgeon: Milus Banister, MD;  Location: [facility];  Service: Endoscopy;  Laterality: N/A;  . LAPAROSCOPIC CHOLECYSTECTOMY SINGLE SITE WITH INTRAOPERATIVE CHOLANGIOGRAM N/A 12/13/2016   Procedure: LAPAROSCOPIC CHOLECYSTECTOMY SINGLE SITE AND LIVER BIOPSY;  Surgeon: Michael Boston, MD;  Location: [facility];  Service: General;  Laterality: N/A;  . OVARY SURGERY      ROS: Review of Systems Negative except as stated above  PHYSICAL EXAM: Vitals with BMI 05/30/2019 11/20/2018 03/21/2018  Height 5\' 7"  5\' 7"  5\' 7"   Weight 216 lbs 207 lbs 13 oz 211 lbs  3 oz  BMI 33.82 123456 0000000  Systolic [id number] 123456  Diastolic 68 69 75  Pulse 67 66 83  SpO2- 98%, room air Temperature- 97.7 F, oral   Physical Exam  General appearance - alert, well appearing, and in no distress and oriented to person, place, and time Mental status - alert, oriented to person, place, and time, normal mood, behavior, speech, dress, motor activity, and thought processes Eyes - pupils equal and reactive, extraocular eye movements intact Ears - bilateral TM's and external ear canals normal Nose - normal and patent, no erythema, discharge or polyps and normal nontender sinuses Mouth - mucous membranes moist, pharynx normal without lesions Neck - supple, no significant adenopathy Lymphatics - no palpable lymphadenopathy, no hepatosplenomegaly Chest - clear to auscultation, no wheezes, rales or rhonchi, symmetric air entry, no tachypnea, retractions or cyanosis Heart - normal rate, regular rhythm, normal S1, S2, no murmurs, rubs, clicks or gallops Neurological - alert, oriented, normal speech, no focal findings or movement disorder noted, neck supple without rigidity, cranial nerves II through XII intact, funduscopic  exam normal, discs flat and sharp, DTR's normal and symmetric, motor and sensory grossly normal bilaterally, normal muscle tone, no tremors, strength 5/5, Romberg sign negative, normal gait and station  ASSESSMENT AND PLAN: 1. Decreased hearing of both ears: -Today's exam of bilateral ears unremarkable. During history taking I conversed with patient without the need to repeat any statements or questions. I was not asked by patient to remove my mask for clarity or so that she could see my mouth in order to read my lips.  -Patient reports bilateral decreased hearing for at least 1 year. Patient has positive family history of mother and maternal grandfather going deaf around her age. -Referral to ENT for further evaluation of bilateral decreased hearing. - Ambulatory referral to ENT -Patient reports she does not have health insurance. She does have [facility] discount/orange card. I consulted with the financial counselor here at the clinic and was able to confirm that patient will need to pay $100 consultation fee to get an appointment with ear doctor. Patient agreeable. -Patient referred to [facility] ph. # [phone] Address [id number]   Patient was given the opportunity to ask questions.  Patient verbalized understanding of the plan and was able to repeat key elements of the plan. Patient was given clear instructions to go to Emergency Department or return to medical center if symptoms don't improve, worsen, or new problems develop.The patient verbalized understanding.  Requested Prescriptions    No prescriptions requested or ordered in this encounter   Lavanna Zachery Dauer, NP

## 2019-05-30 ENCOUNTER — Other Ambulatory Visit: Payer: Self-pay

## 2019-05-30 ENCOUNTER — Encounter: Payer: Self-pay | Admitting: Family

## 2019-05-30 ENCOUNTER — Ambulatory Visit: Payer: Self-pay | Attending: Family | Admitting: Family

## 2019-05-30 VITALS — BP 103/68 | HR 67 | Temp 97.7°F | Ht 67.0 in | Wt 216.0 lb

## 2019-05-30 DIAGNOSIS — H9193 Unspecified hearing loss, bilateral: Secondary | ICD-10-CM

## 2019-05-30 NOTE — Patient Instructions (Addendum)
Referral to ear doctor. Call [facility] ph. # [phone] to make an appointment.  Follow-up with primary doctor as needed.  Hearing Loss Hearing loss is a partial or total loss of the ability to hear. This can be temporary or permanent, and it can happen in one or both ears. Medical care is necessary to treat hearing loss properly and to prevent the condition from getting worse. Your hearing may partially or completely come back, depending on what caused your hearing loss and how severe it is. In some cases, hearing loss is permanent. What are the causes? Common causes of hearing loss include:  Too much wax in the ear canal.  Infection of the ear canal or middle ear.  Fluid in the middle ear.  Injury to the ear or surrounding area.  An object stuck in the ear.  A history of prolonged exposure to loud sounds, such as music. Less common causes of hearing loss include:  Tumors in the ear.  Viral or bacterial infections, such as meningitis.  A hole in the eardrum (perforated eardrum).  Problems with the hearing nerve that sends signals between the brain and the ear.  Certain medicines. What are the signs or symptoms? Symptoms of this condition may include:  Difficulty telling the difference between sounds.  Difficulty following a conversation when there is background noise.  Lack of response to sounds in your environment. This may be most noticeable when you do not respond to startling sounds.  Needing to turn up the volume on the television, radio, or other devices.  Ringing in the ears.  Dizziness. How is this diagnosed? This condition is diagnosed based on:  A physical exam.  A hearing test (audiometry). The audiometry test will be performed by a hearing specialist (audiologist). You may also be referred to an ear, nose, and throat (ENT) specialist (otolaryngologist). How is this treated? Treatment for hearing loss may include:  Ear  wax removal.  Medicines to treat or prevent infection (antibiotics).  Medicines to reduce inflammation (corticosteroids).  Hearing aids for hearing loss related to nerve damage. Follow these instructions at home:  If you were prescribed an antibiotic medicine, take it as told by your health care provider. Do not stop taking the antibiotic even if you start to feel better.  Take over-the-counter and prescription medicines only as told by your health care provider.  Avoid loud noises.  Return to your normal activities as told by your health care provider. Ask your health care provider what activities are safe for you.  Keep all follow-up visits as told by your health care provider. This is important. Contact a health care provider if:  You feel dizzy.  You develop new symptoms.  You vomit or feel nauseous.  You have a fever. Get help right away if:  You develop sudden changes in your vision.  You have severe ear pain.  You have new or increased weakness.  You have a severe headache. Summary  Hearing loss is a decreased ability to hear sounds around you. It can be temporary or permanent.  Treatment will depend on the cause of your hearing loss. It may include ear wax removal, medicines, or a hearing aid.  Your hearing may partially or completely come back, depending on what caused your hearing loss and how severe it is.  Keep all follow-up visits as told by your health care provider. This is important. This information is not intended to replace advice given to you  by your health care provider. Make sure you discuss any questions you have with your health care provider. Document Revised: 10/24/2017 Document Reviewed: 10/24/2017 Elsevier Patient Education  [place].

## 2019-06-13 MED FILL — ?DOXYCYCLINE HYCLATE 100MG: 100 | 10 days supply | Qty: 20 | Fill #0

## 2019-08-11 ENCOUNTER — Ambulatory Visit (HOSPITAL_COMMUNITY)
Admission: EM | Admit: 2019-08-11 | Discharge: 2019-08-11 | Disposition: A | Discharge status: Home / Self Care | Payer: BLUE CROSS/BLUE SHIELD | Attending: Family Medicine | Admitting: Family Medicine

## 2019-08-11 ENCOUNTER — Other Ambulatory Visit: Payer: Self-pay

## 2019-08-11 ENCOUNTER — Encounter (HOSPITAL_COMMUNITY): Payer: Self-pay | Admitting: Emergency Medicine

## 2019-08-11 DIAGNOSIS — E039 Hypothyroidism, unspecified: Secondary | ICD-10-CM | POA: Insufficient documentation

## 2019-08-11 DIAGNOSIS — Z791 Long term (current) use of non-steroidal anti-inflammatories (NSAID): Secondary | ICD-10-CM | POA: Insufficient documentation

## 2019-08-11 DIAGNOSIS — Z880 Allergy status to penicillin: Secondary | ICD-10-CM | POA: Insufficient documentation

## 2019-08-11 DIAGNOSIS — K529 Noninfective gastroenteritis and colitis, unspecified: Secondary | ICD-10-CM | POA: Diagnosis present

## 2019-08-11 DIAGNOSIS — K219 Gastro-esophageal reflux disease without esophagitis: Secondary | ICD-10-CM | POA: Insufficient documentation

## 2019-08-11 DIAGNOSIS — Z79899 Other long term (current) drug therapy: Secondary | ICD-10-CM | POA: Diagnosis not present

## 2019-08-11 DIAGNOSIS — F329 Major depressive disorder, single episode, unspecified: Secondary | ICD-10-CM | POA: Insufficient documentation

## 2019-08-11 DIAGNOSIS — Z20822 Contact with and (suspected) exposure to covid-19: Secondary | ICD-10-CM | POA: Insufficient documentation

## 2019-08-11 DIAGNOSIS — E1136 Type 2 diabetes mellitus with diabetic cataract: Secondary | ICD-10-CM | POA: Insufficient documentation

## 2019-08-11 DIAGNOSIS — F419 Anxiety disorder, unspecified: Secondary | ICD-10-CM | POA: Insufficient documentation

## 2019-08-11 DIAGNOSIS — Z87891 Personal history of nicotine dependence: Secondary | ICD-10-CM | POA: Diagnosis not present

## 2019-08-11 DIAGNOSIS — J069 Acute upper respiratory infection, unspecified: Secondary | ICD-10-CM | POA: Diagnosis not present

## 2019-08-11 DIAGNOSIS — Z833 Family history of diabetes mellitus: Secondary | ICD-10-CM | POA: Diagnosis not present

## 2019-08-11 DIAGNOSIS — E669 Obesity, unspecified: Secondary | ICD-10-CM | POA: Insufficient documentation

## 2019-08-11 LAB — POCT URINALYSIS DIP (DEVICE)
Bilirubin Urine: NEGATIVE
Glucose, UA: NEGATIVE mg/dL
Hgb urine dipstick: NEGATIVE
Ketones, ur: NEGATIVE mg/dL
Leukocytes,Ua: NEGATIVE
Nitrite: NEGATIVE
Protein, ur: NEGATIVE mg/dL
Specific Gravity, Urine: >=1.03 (ref 1.005–1.030)
Urobilinogen, UA: 0.2 mg/dL (ref 0.0–1.0)
pH: 5.5 (ref 5.0–8.0)

## 2019-08-11 LAB — SARS CORONAVIRUS 2 (TAT 6-24 HRS): SARS Coronavirus 2: NEGATIVE

## 2019-08-11 LAB — BASIC METABOLIC PANEL
Anion gap: 10 (ref 5–15)
BUN: 12 mg/dL (ref 8–23)
CO2: 24 mmol/L (ref 22–32)
Calcium: 9.4 mg/dL (ref 8.9–10.3)
Chloride: 105 mmol/L (ref 98–111)
Creatinine, Ser: 0.67 mg/dL (ref 0.44–1.00)
GFR calc Af Amer: >60 mL/min (ref >60)
GFR calc non Af Amer: >60 mL/min (ref >60)
Glucose, Bld: 97 mg/dL (ref 70–99)
Potassium: 4 mmol/L (ref 3.5–5.1)
Sodium: 139 mmol/L (ref 135–145)

## 2019-08-11 LAB — CBC
HCT: 45.9 % (ref 36.0–46.0)
Hemoglobin: 15.4 g/dL — ABNORMAL HIGH (ref 12.0–15.0)
MCH: 30.7 pg (ref 26.0–34.0)
MCHC: 33.6 g/dL (ref 30.0–36.0)
MCV: 91.4 fL (ref 80.0–100.0)
Platelets: 228 10*3/uL (ref 150–400)
RBC: 5.02 MIL/uL (ref 3.87–5.11)
RDW: 12.6 % (ref 11.5–15.5)
WBC: 6.1 10*3/uL (ref 4.0–10.5)
nRBC: 0 % (ref 0.0–0.2)

## 2019-08-11 MED ORDER — CIPROFLOXACIN HCL 500 MG PO TABS: 500.0000 mg | ORAL_TABLET | Freq: Two times a day (BID) | ORAL | 0 refills | Status: DC

## 2019-08-11 NOTE — ED Triage Notes (Signed)
Sick for 4 days.  The worst day for diarrhea was Friday.  Patient has chills, weakness, shaky, diarrhea, and just having difficulty getting thoughts together, sneezing.  Pain and cramping in lower left abdomen.  Patient is sleeping alot

## 2019-08-11 NOTE — Discharge Instructions (Signed)
Drink plenty of fluids Take the cipro 2 x a day for 2 days You can get blood results on My Chart- we will call if anything is alarming

## 2019-08-11 NOTE — ED Notes (Signed)
covid sample labeled, placed in lab

## 2019-08-11 NOTE — ED Provider Notes (Signed)
[facility]    CSN: [id number] Arrival date & time: 08/11/19  1415      History   Chief Complaint Chief Complaint  Patient presents with  . URI    HPI Andrea Blackwell is a 64 y.o. female.   HPI  Patient states she had left lower quadrant abdominal pain and diarrhea and off and on since Thursday.  She states she has had some chills and weakness.  She feels shaky.  She is able to drink normally.  She states a couple times she has had trouble getting her thoughts together.  She tried to go to work on Friday but states she cannot remember her bosses name.  She has been treating herself at home.  She has not taken any medications.  She is sleeping a lot No known exposure to illness, no recent travel, no suspicious foods, no history of colon disorder colitis Colonoscopy in 2019 was normal  Past Medical History:  Diagnosis Date  . Allergy   . Anxiety   . Cataract   . Depression   . Diabetes mellitus without complication ([place])   . GERD (gastroesophageal reflux disease)   . Hypothyroidism   . Pancreatitis     Patient Active Problem List   Diagnosis Date Noted  . Encounter for removal of biliary stent   . Anxiety and depression 02/01/2017  . Bile duct leak   . Acute calculous cholecystitis 12/13/2016  . Jaundice 12/13/2016  . Obesity 12/13/2016  . Hypothyroidism   . Gallstone pancreatitis 12/10/2016    Past Surgical History:  Procedure Laterality Date  . CHOLECYSTECTOMY    . ENDOSCOPIC RETROGRADE CHOLANGIOPANCREATOGRAPHY (ERCP) WITH PROPOFOL N/A 02/02/2017   Procedure: ENDOSCOPIC RETROGRADE CHOLANGIOPANCREATOGRAPHY (ERCP) WITH PROPOFOL;  Surgeon: Milus Banister, MD;  Location: [facility];  Service: Endoscopy;  Laterality: N/A;  . ERCP N/A 12/10/2016   Procedure: ENDOSCOPIC RETROGRADE CHOLANGIOPANCREATOGRAPHY (ERCP);  Surgeon: Carol Ada, MD;  Location: [facility];  Service: Endoscopy;  Laterality: N/A;  . ERCP N/A 12/14/2016   Procedure: ENDOSCOPIC  RETROGRADE CHOLANGIOPANCREATOGRAPHY (ERCP);  Surgeon: Milus Banister, MD;  Location: [facility];  Service: Endoscopy;  Laterality: N/A;  . LAPAROSCOPIC CHOLECYSTECTOMY SINGLE SITE WITH INTRAOPERATIVE CHOLANGIOGRAM N/A 12/13/2016   Procedure: LAPAROSCOPIC CHOLECYSTECTOMY SINGLE SITE AND LIVER BIOPSY;  Surgeon: Michael Boston, MD;  Location: [facility];  Service: General;  Laterality: N/A;  . OVARY SURGERY      OB History    Gravida  2   Para      Term      Preterm      AB  2   Living        SAB      TAB      Ectopic      Multiple      Live Births               Home Medications    Prior to Admission medications   Medication Sig Start Date End Date Taking? Authorizing Provider  escitalopram (LEXAPRO) 10 MG tablet Take 10 mg by mouth daily.   Yes [provider]  fluticasone (FLONASE) 50 MCG/ACT nasal spray PLACE 2 SPRAYS INTO BOTH NOSTRILS DAILY. 02/07/19  Yes Gildardo Pounds, NP  levothyroxine (SYNTHROID) 150 MCG tablet Take 1 tablet (150 mcg total) by mouth daily before breakfast. Please mail 08/01/18  Yes Gildardo Pounds, NP  ciprofloxacin (CIPRO) 500 MG tablet Take 1 tablet (500 mg total) by mouth 2 (two) times daily. 08/11/19  Raylene Everts, MD  diclofenac sodium (VOLTAREN) 1 % GEL Apply topically 4 (four) times daily.    [provider]  escitalopram (LEXAPRO) 20 MG tablet Take 1 tablet (20 mg total) by mouth at bedtime. 08/01/18 10/30/18  Gildardo Pounds, NP  omega-3 acid ethyl esters (LOVAZA) 1 g capsule Take 2 capsules (2 g total) by mouth 2 (two) times daily. 11/21/18 12/21/18  Gildardo Pounds, NP  Probiotic CAPS Take 1 capsule by mouth daily.    [provider]    Family History Family History  Problem Relation Age of Onset  . Diabetes Maternal Grandfather   . Colon cancer Neg Hx   . Esophageal cancer Neg Hx   . Liver cancer Neg Hx   . Pancreatic cancer Neg Hx   . Rectal cancer Neg Hx   . Stomach cancer Neg Hx   .  Breast cancer Neg Hx   . Prostate cancer Neg Hx     Social History Social History   Tobacco Use  . Smoking status: Former Smoker    Quit date: 1995    Years since quitting: 26.5  . Smokeless tobacco: Never Used  Vaping Use  . Vaping Use: Never used  Substance Use Topics  . Alcohol use: Yes    Comment: occasional  . Drug use: No     Allergies   Penicillins   Review of Systems Review of Systems See HPI Physical Exam Triage Vital Signs ED Triage Vitals  Enc Vitals Group     BP 08/11/19 1538 135/60     Pulse Rate 08/11/19 1538 64     Resp 08/11/19 1538 18     Temp 08/11/19 1538 98.3 F (36.8 C)     Temp Source 08/11/19 1538 Oral     SpO2 08/11/19 1538 99 %     Weight --      Height --      Head Circumference --      Peak Flow --      Pain Score 08/11/19 1553 4     Pain Loc --      Pain Edu? --      Excl. in [place]? --    No data found.  Updated Vital Signs BP 135/60 (BP Location: Left Arm)   Pulse 64   Temp 98.3 F (36.8 C) (Oral)   Resp 18   SpO2 99%       Physical Exam Constitutional:      General: She is not in acute distress.    Appearance: She is well-developed.  HENT:     Head: Normocephalic and atraumatic.     Nose:     Comments: Mask in place    Mouth/Throat:     Mouth: Mucous membranes are dry.     Comments: Membranes slightly dry Eyes:     Conjunctiva/sclera: Conjunctivae normal.     Pupils: Pupils are equal, round, and reactive to light.  Cardiovascular:     Rate and Rhythm: Normal rate and regular rhythm.     Heart sounds: Normal heart sounds.  Pulmonary:     Effort: Pulmonary effort is normal. No respiratory distress.     Breath sounds: Normal breath sounds.  Abdominal:     General: There is no distension.     Palpations: Abdomen is soft. There is no mass.     Tenderness: There is abdominal tenderness.     Comments: No mass organomegaly.  Mild tenderness to deep palpation in the left  lower quadrant.  No guarding or rebound    Musculoskeletal:        General: Normal range of motion.     Cervical back: Normal range of motion.  Skin:    General: Skin is warm and dry.  Neurological:     General: No focal deficit present.     Mental Status: She is alert.  Psychiatric:        Mood and Affect: Mood normal.        Behavior: Behavior normal.      UC Treatments / Results  Labs (all labs ordered are listed, but only abnormal results are displayed) Labs Reviewed  CBC - Abnormal; Notable for the following components:      Result Value   Hemoglobin 15.4 (*)    All other components within normal limits  SARS CORONAVIRUS 2 (TAT 6-24 HRS)  BASIC METABOLIC PANEL  POCT URINALYSIS DIP (DEVICE)   CBC normal.  White count is normal Electrolytes are normal .  Urinalysis is normal.  Specific gravity of 1030 indicates mild dehydration.  EKG   Radiology No results found.  Procedures Procedures (including critical care time)  Medications Ordered in UC Medications - No data to display  Initial Impression / Assessment and Plan / UC Course  I have reviewed the triage vital signs and the nursing notes.  Pertinent labs & imaging results that were available during my care of the patient were reviewed by me and considered in my medical decision making (see chart for details).    Do not suspect any serious abdominal process.  Normal labs.  Will send home to orally rehydrate.  3 days of Cipro for colitis, persistent diarrhea for 5 days. Reasons for return are reviewed with patient  Final Clinical Impressions(s) / UC Diagnoses   Final diagnoses:  Colitis     Discharge Instructions     Drink plenty of fluids Take the cipro 2 x a day for 2 days You can get blood results on My Chart- we will call if anything is alarming     ED Prescriptions    Medication Sig Dispense Auth. Provider   ciprofloxacin (CIPRO) 500 MG tablet Take 1 tablet (500 mg total) by mouth 2 (two) times daily. 6 tablet Raylene Everts,  MD     PDMP not reviewed this encounter.   Raylene Everts, MD 08/11/19 [phone]

## 2020-09-10 ENCOUNTER — Encounter: Payer: Self-pay | Admitting: Gastroenterology

## 2021-06-14 NOTE — Progress Notes (Deleted)
    Subjective:    CC: neck pain  I, Redonna Wilbert, LAT, ATC, am serving as scribe for Dr. Lynne Leader.  HPI: Pt is a 66 y/o female presenting w/ c/o neck pain x .  She locates her pain to .  Radiating pain: UE paresthesias: Aggravating factors:  Treatments tried:   Diagnostic testing: C-spine XR- 02/22/18  Pertinent review of Systems: ***  Relevant historical information: ***   Objective:   There were no vitals filed for this visit. General: Well Developed, well nourished, and in no acute distress.   MSK: ***  Lab and Radiology Results No results found for this or any previous visit (from the past 72 hour(s)). No results found.    Impression and Recommendations:    Assessment and Plan: 66 y.o. female with ***.  PDMP not reviewed this encounter. No orders of the defined types were placed in this encounter.  No orders of the defined types were placed in this encounter.   Discussed warning signs or symptoms. Please see discharge instructions. Patient expresses understanding.   ***

## 2021-06-15 ENCOUNTER — Ambulatory Visit: Payer: BLUE CROSS/BLUE SHIELD | Admitting: Family Medicine

## 2021-06-28 ENCOUNTER — Other Ambulatory Visit: Payer: Self-pay

## 2021-06-28 ENCOUNTER — Emergency Department (HOSPITAL_BASED_OUTPATIENT_CLINIC_OR_DEPARTMENT_OTHER)
Admission: EM | Admit: 2021-06-28 | Discharge: 2021-06-29 | Disposition: A | Discharge status: Home / Self Care | Payer: No Typology Code available for payment source | Attending: Emergency Medicine | Admitting: Emergency Medicine

## 2021-06-28 ENCOUNTER — Emergency Department (HOSPITAL_COMMUNITY): Payer: Self-pay

## 2021-06-28 ENCOUNTER — Emergency Department (HOSPITAL_COMMUNITY): Payer: No Typology Code available for payment source

## 2021-06-28 ENCOUNTER — Encounter (HOSPITAL_BASED_OUTPATIENT_CLINIC_OR_DEPARTMENT_OTHER): Payer: Self-pay

## 2021-06-28 DIAGNOSIS — S0990XA Unspecified injury of head, initial encounter: Secondary | ICD-10-CM | POA: Diagnosis present

## 2021-06-28 DIAGNOSIS — H53149 Visual discomfort, unspecified: Secondary | ICD-10-CM | POA: Insufficient documentation

## 2021-06-28 DIAGNOSIS — W01198A Fall on same level from slipping, tripping and stumbling with subsequent striking against other object, initial encounter: Secondary | ICD-10-CM | POA: Insufficient documentation

## 2021-06-28 DIAGNOSIS — S060XAA Concussion with loss of consciousness status unknown, initial encounter: Secondary | ICD-10-CM | POA: Insufficient documentation

## 2021-06-28 DIAGNOSIS — F801 Expressive language disorder: Secondary | ICD-10-CM | POA: Diagnosis not present

## 2021-06-28 DIAGNOSIS — R4701 Aphasia: Secondary | ICD-10-CM

## 2021-06-28 NOTE — ED Provider Notes (Signed)
  10:31 PM Arrived from [facility] for MRI brain due to persistent concussion symptoms after head injury 05/25/20.  Neurology recommended MRI and if negative, can discharge home to follow-up with neurology in clinic.  Talkative in hallway.  Denies hx of claustrophobia in the past.  Results for orders placed or performed during the hospital encounter of 08/11/19  SARS CORONAVIRUS 2 (TAT 6-24 HRS) Nasopharyngeal Nasopharyngeal Swab   Specimen: Nasopharyngeal Swab  Result Value Ref Range   SARS Coronavirus 2 NEGATIVE NEGATIVE  Basic metabolic panel  Result Value Ref Range   Sodium 139 135 - 145 mmol/L   Potassium 4.0 3.5 - 5.1 mmol/L   Chloride 105 98 - 111 mmol/L   CO2 24 22 - 32 mmol/L   Glucose, Bld 97 70 - 99 mg/dL   BUN 12 8 - 23 mg/dL   Creatinine, Ser 0.67 0.44 - 1.00 mg/dL   Calcium 9.4 8.9 - 10.3 mg/dL   GFR calc non Af Amer >60 >60 mL/min   GFR calc Af Amer >60 >60 mL/min   Anion gap 10 5 - 15  CBC  Result Value Ref Range   WBC 6.1 4.0 - 10.5 K/uL   RBC 5.02 3.87 - 5.11 MIL/uL   Hemoglobin 15.4 (H) 12.0 - 15.0 g/dL   HCT 45.9 36.0 - 46.0 %   MCV 91.4 80.0 - 100.0 fL   MCH 30.7 26.0 - 34.0 pg   MCHC 33.6 30.0 - 36.0 g/dL   RDW 12.6 11.5 - 15.5 %   Platelets 228 150 - 400 K/uL   nRBC 0.0 0.0 - 0.2 %  POCT urinalysis dip (device)  Result Value Ref Range   Glucose, UA NEGATIVE NEGATIVE mg/dL   Bilirubin Urine NEGATIVE NEGATIVE   Ketones, ur NEGATIVE NEGATIVE mg/dL   Specific Gravity, Urine >=1.030 1.005 - 1.030   Hgb urine dipstick NEGATIVE NEGATIVE   pH 5.5 5.0 - 8.0   Protein, ur NEGATIVE NEGATIVE mg/dL   Urobilinogen, UA 0.2 0.0 - 1.0 mg/dL   Nitrite NEGATIVE NEGATIVE   Leukocytes,Ua NEGATIVE NEGATIVE   MR BRAIN WO CONTRAST  Result Date: 06/28/2021 CLINICAL DATA:  Head trauma, postconcussive syndrome with expressive aphasia EXAM: MRI HEAD WITHOUT CONTRAST TECHNIQUE: Multiplanar, multiecho pulse sequences of the brain and surrounding structures were obtained without  intravenous contrast. COMPARISON:  No prior MRI, correlation is made with CT head 05/25/2021 FINDINGS: Brain: No restricted diffusion to suggest acute or subacute infarct. No acute hemorrhage, mass, mass effect, or midline shift. No hemosiderin deposition to suggest remote hemorrhage or diffuse axonal injury. No hydrocephalus or extra-axial collection. Scattered T2 hyperintense signal in the periventricular white matter, likely the sequela of chronic small vessel ischemic disease. Vascular: Normal arterial flow voids. Skull and upper cervical spine: Normal marrow signal. Sinuses/Orbits: No acute finding. Other: The mastoids are well aerated. IMPRESSION: No acute intracranial process. No hemosiderin deposition to suggest remote hemorrhage or diffuse axonal injury. Electronically Signed   By: Merilyn Baba M.D.   On: 06/28/2021 23:41    MRI is negative.  Results discussed with patient, she acknowledged understanding.  She has already been seen by concussion clinic and told she needed to follow-up with a neurologist so we will place ambulatory referral.  Will be given work note.  Encouraged to return here for any new/acute changes.   Larene Pickett, PA-C 06/29/21 0031    Valarie Merino, MD 06/30/21 [phone]

## 2021-06-28 NOTE — ED Provider Notes (Signed)
[facility] EMERGENCY DEPT Provider Note   CSN: [id number] Arrival date & time: 06/28/21  1746     History  Chief Complaint  Patient presents with   Concussion    Andrea Blackwell is a 66 y.o. female presenting to emergency department with complaints of lingering concussion symptoms.  The patient was seen in the emergency department on 05/25/21 after mechanical fall and head injury, had a CT scan of the brain and cervical spine which did not show acute injuries, was diagnosed with a concussion at that time.  She reports that ever since then she has had severe persistent daily headaches, photophobia, there is also had pain and difficulty with her speech and thinking, reports "every time I try to think I can't do it," reports that she has difficulty forming words, she repeats herself, this has severely impacted her ability to work.  She does not have any family in the area reports she has a church group that checks in on her.  She reports she has seen an orthopedic doctor as well as a concussion specialist, and were told that the symptoms can be expected from a concussion and that she needs to see a neurologist.  She is not able to make a timely appointment with neurology.  HPI     Home Medications Prior to Admission medications   Medication Sig Start Date End Date Taking? Authorizing Provider  ciprofloxacin (CIPRO) 500 MG tablet Take 1 tablet (500 mg total) by mouth 2 (two) times daily. 08/11/19   Raylene Everts, MD  diclofenac sodium (VOLTAREN) 1 % GEL Apply topically 4 (four) times daily.    [provider]  escitalopram (LEXAPRO) 10 MG tablet Take 10 mg by mouth daily.    [provider]  escitalopram (LEXAPRO) 20 MG tablet Take 1 tablet (20 mg total) by mouth at bedtime. 08/01/18 10/30/18  Gildardo Pounds, NP  fluticasone (FLONASE) 50 MCG/ACT nasal spray PLACE 2 SPRAYS INTO BOTH NOSTRILS DAILY. 02/07/19   Gildardo Pounds, NP  levothyroxine (SYNTHROID) 150  MCG tablet Take 1 tablet (150 mcg total) by mouth daily before breakfast. Please mail 08/01/18   Gildardo Pounds, NP  omega-3 acid ethyl esters (LOVAZA) 1 g capsule Take 2 capsules (2 g total) by mouth 2 (two) times daily. 11/21/18 12/21/18  Gildardo Pounds, NP  Probiotic CAPS Take 1 capsule by mouth daily.    [provider]      Allergies    Penicillins    Review of Systems   Review of Systems  Physical Exam Updated Vital Signs BP (!) 160/76   Pulse 61   Temp 98.2 F (36.8 C)   Resp 16   Ht '5\' 7"'$  (1.702 m)   Wt 98 kg   SpO2 100%   BMI 33.84 kg/m  Physical Exam Constitutional:      General: She is not in acute distress. HENT:     Head: Normocephalic and atraumatic.     Comments: Photophobia Stuttering speech, repeating herself, tearful Eyes:     Conjunctiva/sclera: Conjunctivae normal.     Pupils: Pupils are equal, round, and reactive to light.  Cardiovascular:     Rate and Rhythm: Normal rate and regular rhythm.  Pulmonary:     Effort: Pulmonary effort is normal. No respiratory distress.  Abdominal:     General: There is no distension.     Tenderness: There is no abdominal tenderness.  Skin:    General: Skin is warm and dry.  Neurological:     General: No focal deficit present.     Mental Status: She is alert. Mental status is at baseline.    ED Results / Procedures / Treatments   Labs (all labs ordered are listed, but only abnormal results are displayed) Labs Reviewed - No data to display  EKG None  Radiology No results found.  Procedures Procedures    Medications Ordered in ED Medications - No data to display  ED Course/ Medical Decision Making/ A&P Clinical Course as of 06/28/21 2008  Mon Jun 28, 2021  2006 Neurologist Dr Curly Shores by phone recommending transfer to [facility] for an MRI of the brain given his persistent symptoms and expressive aphasia.  Patient is in agreement and she will have her friend drive her directly to  the [facility], ER.  MRI of the brain has been ordered.  If this is unremarkable anticipate discharge with an ambulatory referral to neurology.  A work note was provided. [MT]  2007 Dr Francia Greaves EDP accepting at [facility] [MT]    Clinical Course User Index [MT] Langston Masker Carola Rhine, MD                           Medical Decision Making Amount and/or Complexity of Data Reviewed Radiology: ordered.   Patient is here with suspected severe postconcussive syndrome.  She continues to have photophobia, slow and stuttering speech, difficulty gathering her thoughts and formulating full sentences.  When she gets frustrated she is repeating herself and repeating the same words over and over on exam.    I reviewed her external work-up including CT scan of her brain and cervical spine at the time of the concussion which showed no acute injuries.  I suspect this is likely an ongoing postconcussive syndrome.  I will discuss the case with neurology given the extent of her symptoms, which appear quite debilitating, and are clearly affecting her ability to work and function at home.        Final Clinical Impression(s) / ED Diagnoses Final diagnoses:  Concussion with unknown loss of consciousness status, initial encounter  Expressive aphasia    Rx / DC Orders ED Discharge Orders          Ordered    Ambulatory referral to Neurology       Comments: An appointment is requested in approximately: 1 week Severe post concussive syndrome   06/28/21 2004              Myliyah Rebuck J, MD 06/28/21 2008

## 2021-06-28 NOTE — Discharge Instructions (Addendum)
Your MRI today was normal. Ambulatory referral has been placed for you.  If you do not hear from them in the next few days please call and follow-up. Return here for new concerns.

## 2021-06-28 NOTE — ED Triage Notes (Signed)
Patient here POV from Home.  Endorses falling on 05/25/2021 due to Slipping on Cardboard and hitting Head on Concrete. CT Head was completed at that Time with No Acute Findings noted.  Diagnosed with Concussion and endorses continued Headaches and Trouble with Sentence-Forming.  Seen by Orthopedic MD on Thursday and referred to Neurology. Instructed to Seek ED Evaluation however as Case is WC related and may take longer.  NAD Noted during Triage. A&Ox4. GCS 15. Ambulatory.

## 2021-06-28 NOTE — ED Notes (Signed)
Pt arrives POV from [place] ED for MRI. Pt reports HA on top of her head only when she thinks.No hx of claustrophobia.

## 2021-06-29 NOTE — ED Notes (Signed)
DC instructions reviewed with pt. PT verbalized understanding. PT DC °

## 2021-07-29 ENCOUNTER — Encounter: Payer: Self-pay | Admitting: Physical Medicine & Rehabilitation

## 2021-10-06 ENCOUNTER — Ambulatory Visit: Payer: No Typology Code available for payment source | Attending: Physician Assistant

## 2021-10-06 ENCOUNTER — Other Ambulatory Visit: Payer: Self-pay

## 2021-10-06 DIAGNOSIS — F985 Adult onset fluency disorder: Secondary | ICD-10-CM | POA: Diagnosis present

## 2021-10-06 DIAGNOSIS — R41841 Cognitive communication deficit: Secondary | ICD-10-CM | POA: Insufficient documentation

## 2021-10-06 DIAGNOSIS — R4789 Other speech disturbances: Secondary | ICD-10-CM | POA: Insufficient documentation

## 2021-10-06 NOTE — Therapy (Unsigned)
OUTPATIENT SPEECH LANGUAGE PATHOLOGY EVALUATION   Patient Name: Andrea Blackwell MRN: [id number] DOB:22-Nov-1955, 66 y.o., female Today's Date: 10/07/2021  PCP: Andrea Rankins, NP REFERRING PROVIDER: Arnoldo Lenis, PA-C ([place])   End of Session - 10/07/21 1258     Visit Number 1    Number of Visits 17    Date for SLP Re-Evaluation 12/03/21    SLP Start Time 5    SLP Stop Time  1400    SLP Time Calculation (min) 40 min    Activity Tolerance Other (comment)   limited due to anxiety, lability, low frustration tolerance, and severity of fluency disorder            Past Medical History:  Diagnosis Date   Allergy    Anxiety    Cataract    Depression    Diabetes mellitus without complication ([place])    GERD (gastroesophageal reflux disease)    Hypothyroidism    Pancreatitis    Past Surgical History:  Procedure Laterality Date   CHOLECYSTECTOMY     ENDOSCOPIC RETROGRADE CHOLANGIOPANCREATOGRAPHY (ERCP) WITH PROPOFOL N/A 02/02/2017   Procedure: ENDOSCOPIC RETROGRADE CHOLANGIOPANCREATOGRAPHY (ERCP) WITH PROPOFOL;  Surgeon: Andrea Banister, MD;  Location: [facility] ENDOSCOPY;  Service: Endoscopy;  Laterality: N/A;   ERCP N/A 12/10/2016   Procedure: ENDOSCOPIC RETROGRADE CHOLANGIOPANCREATOGRAPHY (ERCP);  Surgeon: Andrea Ada, MD;  Location: [facility];  Service: Endoscopy;  Laterality: N/A;   ERCP N/A 12/14/2016   Procedure: ENDOSCOPIC RETROGRADE CHOLANGIOPANCREATOGRAPHY (ERCP);  Surgeon: Andrea Banister, MD;  Location: [facility] ENDOSCOPY;  Service: Endoscopy;  Laterality: N/A;   LAPAROSCOPIC CHOLECYSTECTOMY SINGLE SITE WITH INTRAOPERATIVE CHOLANGIOGRAM N/A 12/13/2016   Procedure: LAPAROSCOPIC CHOLECYSTECTOMY SINGLE SITE AND LIVER BIOPSY;  Surgeon: Andrea Boston, MD;  Location: [facility] ORS;  Service: General;  Laterality: N/A;   OVARY SURGERY     Patient Active Problem List   Diagnosis Date Noted   Encounter for removal of biliary stent    Anxiety and depression  02/01/2017   Bile duct leak    Acute calculous cholecystitis 12/13/2016   Jaundice 12/13/2016   Obesity 12/13/2016   Hypothyroidism    Gallstone pancreatitis 12/10/2016    ONSET DATE: 05/25/21   REFERRING DIAG: X32.3F5D (ICD-10-CM) - Concussion without loss of consciousness, initial encounter 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  Pt accompanied by: self  PERTINENT HISTORY: Andrea Blackwell is a 66 y.o. with premorbid PTSD, anxiety seen in the [facility] emergency department on 05/25/21 after mechanical fall and head injury, had a CT scan at that time of the brain and cervical spine which did not show acute injuries, was diagnosed with a concussion at that time. Second ED visit to [facility] ED 06/28/21 at which time she reported that ever since then she has had severe persistent daily headaches, photophobia, there is also had pain and difficulty with her speech and thinking, reports "every time I try to think I can't do it," reports that she has difficulty forming words, she repeats herself, this  has severely impacted her ability to work.  She does not have any family in the area reports she has a church group that checks in on her.  She reports she has seen an orthopedic doctor as well as a concussion specialist, and were told that the symptoms can be expected from a concussion and that she needs to see a neurologist.  Pt is currently undergoing neuropsychiatric testing in  [facility]. SLP to attempt to obtain those records.   LIVING ENVIRONMENT: Lives with: lives alone with daily check-ins from church friends Lives in: House/apartment  PLOF:  Level of assistance: Independent with ADLs Employment: Part-time employment   PATIENT GOALS  "Get back to being me again."  OBJECTIVE:   DIAGNOSTIC FINDINGS:  MRI Brain Without Contrast IMPRESSION: No acute intracranial process. No hemosiderin deposition to suggest remote hemorrhage or diffuse axonal injury.   COGNITION: Overall cognitive status: Difficulty to assess due to: emotional lability and dysfluency. Areas of impairment:  Attention: Impaired: Comment: TBD after more testing Memory: Impaired: Short term Prospective TBD after more testing Behavior: Verbal agitation, Poor frustration tolerance, and Lability  Functional deficits:  Blaze reports difficulty with recall and concentration. Decr'd ability with all areas was seen today to exacerbate her dysfluency which lead to more frustration and then worse dysfluency, and so on. Eventually this lead to tears or the patient stopping verbalization and breathing and a second attempt at verbalization.  AUDITORY COMPREHENSION: Overall auditory comprehension: Appears intact  READING COMPREHENSION: Impaired: "I get lost and need someone to read it to me"  EXPRESSION: verbal and nonverbal - written  VERBAL EXPRESSION: Level of generative/spontaneous verbalization: conversation; All tasks hindered by severity level of dysfluency Automatic speech: month of year: intact  Repetition: Appears intact Naming:  Appears intact Pragmatics: Impaired: abnormal effect and dysprosody Comments: Pt's language skills appear WNL however pragmatically pt's severity level of dysfluency and peculiar prosody due to decr'd vocal control area alarming to listeners. Socially this has and will cont to hinder pt's verbal communication. Pt was able to write a paragraph with WNL  language, re: her responsibilities and situation surrounding her work at [employer]. Non-verbal means of communication: gestures and writing  WRITTEN EXPRESSION: Dominant hand: right  Written expression: Appears intact  MOTOR SPEECH: Overall motor speech: impaired Level of impairment: Word, Phrase, Sentence, and Conversation Respiration: speaking on residual capacity and level of dysfluency hindered timing of respiratory system, articulation, and voicing Phonation: normal Resonance:  abnormal Articulation:  no dysarthria was appreciated Intelligibility: Intelligible Motor planning: Impaired: aware and inconsistent Motor speech errors: inconsistent Interfering components:  psychological impact of dysfluency and cognitive deficits Effective technique:  internal relaxation - when pt appeared least anxious and internally calm, minimal phoneme and syllable repetitions were heard (frequency of approx 2 relaxed reps/utterance with 2-3 reps a piece).  Pt was unable to decr frequency and severity of dysfluency in choral vocal tasks, nor was able to decr severity/frequency in singing tasks.   ORAL MOTOR EXAMINATION Overall status: Did not assess Comments: due to time constraints   PATIENT REPORTED OUTCOME MEASURES (PROM): Communication Participation Item Bank:   and Communication Effectiveness Survey: to be completed.   TODAY'S TREATMENT:  SLP explained results of SLP assessment. SLP shared that assistance could be provided in reducing pt's severity of dysfluency by using some fluency enhancing strategies and in-block modification, but that SLP believes that there is definitely a psychological component to her disorder which would require psychological assistance.   PATIENT EDUCATION:  Education details: as above in today's treatment Person educated: Patient Education method: Explanation and Demonstration Education comprehension: verbalized understanding, returned  demonstration, and needs further education     GOALS: Goals reviewed with patient? Yes, in general  SHORT TERM GOALS: Target date: 11/04/2021    Pt will demo easy onset of voicing with vowel+consonant syllables and words 60% success in 2 sessions Baseline: Goal status: INITIAL  2.  Pt will demonstrate slowed speech rate when reading words and phrases, in 4 sessions Baseline:  Goal status: INITIAL  3.  When experiencing multiple reps during reading words, pt will cease verbalization and take a "refresher/cleansing" breath, in 50% of opportunities  Baseline:  Goal status: INITIAL  4.  Pt will seek out psychological therapy/treatment dealing with her life changes post-fall, including changes in speech pattern Baseline:  Goal status: INITIAL  5.  Pt will tell SLP 2 new practical memory and attention compensations she could use, in 3 sessions Baseline:  Goal status: INITIAL  6.  Pt will complete PROM as listed above Baseline:  Goal status: INITIAL  LONG TERM GOALS: Target date:  12-03-21     Pt will demo easy onset of voicing with 2-3 syllable stimuli 60% success in 2 sessions Baseline:  Goal status: INITIAL  2.  Pt will demonstrate slowed speech rate when reading 5-6 word sentences, in 3 sessions Baseline:  Goal status: INITIAL  3.  When experiencing multiple reps, pt will cease verbalization and use a "reset" technique, in 70% of opportunities Baseline:  Goal status: INITIAL  4.  Pt will tell SLP about or demo successful use of memory compensations in 3 sessions Baseline:  Goal status: INITIAL  5.  Pt will tell SLP about or demo successful use of attention compensations in 3 sessions Baseline:  Goal status: INITIAL  6.  Pt will score higher QOL on PROMs than on initial administration Baseline:  Goal status: INITIAL  ASSESSMENT:  CLINICAL IMPRESSION: Patient is a 67 y.o. female who was seen today for assessment of speech/fluency changes since two weeks after  a fall in April. At this time SLP is unsure if pt's deficits are neurogenic, psychogenic, or both. Regardless, pt's deficits are significantly negatively impacting her verbal communication - she is able to adequately write out messages but this is very inefficient and impossible while using the telephone. Additionally, pt is reporting sx often reported with attention deficits and memory deficits. She is currently undergoing neuropsychiatric testing at [facility] in [place]. SLP is attempting to obtain those records in order to explore incorporating those results into pt's therapy plan .   OBJECTIVE IMPAIRMENTS include attention, memory, expressive language, and dysfluency . These impairments are limiting patient from return to work, managing medications, managing appointments, managing finances, household responsibilities, ADLs/IADLs, and effectively communicating at home and in community. Factors affecting potential to achieve goals and functional outcome are ability to learn/carryover information, cooperation/participation level, severity of impairments, and anxiety level, and low frustration tolerance . Patient will benefit from skilled SLP services to address above impairments and improve overall function.  REHAB POTENTIAL: Fair given psychological component to pt's disorder exacerbating her dysfluency sx.  PLAN: SLP FREQUENCY: 2x/week  SLP DURATION: 8 weeks  PLANNED INTERVENTIONS: Environmental controls, Cueing hierachy, Cognitive reorganization, Internal/external aids, Oral motor exercises, Functional tasks, Multimodal communication approach, SLP instruction and feedback, Compensatory strategies, and Patient/family education    [facility], [place] 10/07/2021, 1:00 PM

## 2021-10-12 ENCOUNTER — Ambulatory Visit: Payer: No Typology Code available for payment source | Attending: Physician Assistant

## 2021-10-12 DIAGNOSIS — R4789 Other speech disturbances: Secondary | ICD-10-CM | POA: Insufficient documentation

## 2021-10-12 DIAGNOSIS — F985 Adult onset fluency disorder: Secondary | ICD-10-CM | POA: Diagnosis present

## 2021-10-12 DIAGNOSIS — R41841 Cognitive communication deficit: Secondary | ICD-10-CM | POA: Diagnosis present

## 2021-10-12 NOTE — Therapy (Signed)
OUTPATIENT SPEECH LANGUAGE PATHOLOGY EVALUATION   Patient Name: Andrea Blackwell MRN: [id number] DOB:[birth year], 66 y.o., female Today's Date: 10/12/2021  PCP: [name], NP REFERRING PROVIDER: Arnoldo Lenis, PA-C [facility])     Past Medical History:  Diagnosis Date   Allergy    Anxiety    Cataract    Depression    Diabetes mellitus without complication ([place])    GERD (gastroesophageal reflux disease)    Hypothyroidism    Pancreatitis    Past Surgical History:  Procedure Laterality Date   CHOLECYSTECTOMY     ENDOSCOPIC RETROGRADE CHOLANGIOPANCREATOGRAPHY (ERCP) WITH PROPOFOL N/A 02/02/2017   Procedure: ENDOSCOPIC RETROGRADE CHOLANGIOPANCREATOGRAPHY (ERCP) WITH PROPOFOL;  Surgeon: Milus Banister, MD;  Location: [facility];  Service: Endoscopy;  Laterality: N/A;   ERCP N/A 12/10/2016   Procedure: ENDOSCOPIC RETROGRADE CHOLANGIOPANCREATOGRAPHY (ERCP);  Surgeon: Carol Ada, MD;  Location: [facility];  Service: Endoscopy;  Laterality: N/A;   ERCP N/A 12/14/2016   Procedure: ENDOSCOPIC RETROGRADE CHOLANGIOPANCREATOGRAPHY (ERCP);  Surgeon: Milus Banister, MD;  Location: [facility];  Service: Endoscopy;  Laterality: N/A;   LAPAROSCOPIC CHOLECYSTECTOMY SINGLE SITE WITH INTRAOPERATIVE CHOLANGIOGRAM N/A 12/13/2016   Procedure: LAPAROSCOPIC CHOLECYSTECTOMY SINGLE SITE AND LIVER BIOPSY;  Surgeon: Michael Boston, MD;  Location: [facility];  Service: General;  Laterality: N/A;   OVARY SURGERY     Patient Active Problem List   Diagnosis Date Noted   Encounter for removal of biliary stent    Anxiety and depression 02/01/2017   Bile duct leak    Acute calculous cholecystitis 12/13/2016   Jaundice 12/13/2016   Obesity 12/13/2016   Hypothyroidism    Gallstone pancreatitis 12/10/2016    ONSET DATE: 05/25/21   REFERRING DIAG: X73.5H2D (ICD-10-CM) - Concussion without loss of consciousness, initial encounter F98.5 (ICD-10-CM) - Adult onset fluency disorder F44.4  (ICD-10-CM) - 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  Pt accompanied by: self  PERTINENT HISTORY: Andrea Blackwell is a 66 y.o. with premorbid PTSD, anxiety seen in the [facility] emergency department on 05/25/21 after mechanical fall and head injury, had a CT scan at that time of the brain and cervical spine which did not show acute injuries, was diagnosed with a concussion at that time. Second ED visit to [facility] ED 06/28/21 at which time she reported that ever since then she has had severe persistent daily headaches, photophobia, there is also had pain and difficulty with her speech and thinking, reports "every time I try to think I can't do it," reports that she has difficulty forming words, she repeats herself, this has severely impacted her ability to work.  She does not have any family in the area reports she has a church group that checks in on her.  She reports she has seen an orthopedic doctor as well as a concussion specialist, and were told that the symptoms can be expected from a concussion and that she needs to see a neurologist.  Pt is currently undergoing neuropsychiatric testing in [facility]. SLP to attempt to obtain those records.   LIVING  ENVIRONMENT: Lives with: lives alone with daily check-ins from church friends Lives in: House/apartment  PLOF:  Level of assistance: Independent with ADLs Employment: Part-time employment   PATIENT GOALS  "Get back to being me again."  OBJECTIVE:   DIAGNOSTIC FINDINGS:  MRI Brain  Without Contrast IMPRESSION: No acute intracranial process. No hemosiderin deposition to suggest remote hemorrhage or diffuse axonal injury.   COGNITION: Overall cognitive status: Difficulty to assess due to: emotional lability and dysfluency. Areas of impairment:  Attention: Impaired: Comment: TBD after more testing Memory: Impaired: Short term Prospective TBD after more testing Behavior: Verbal agitation, Poor frustration tolerance, and Lability  Functional deficits:  Andrea Blackwell reports difficulty with recall and concentration. Decr'd ability with all areas was seen today to exacerbate her dysfluency which lead to more frustration and then worse dysfluency, and so on. Eventually this lead to tears or the patient stopping verbalization and breathing and a second attempt at verbalization.  AUDITORY COMPREHENSION: Overall auditory comprehension: Appears intact  READING COMPREHENSION: Impaired: "I get lost and need someone to read it to me"  EXPRESSION: verbal and nonverbal - written  VERBAL EXPRESSION: Level of generative/spontaneous verbalization: conversation; All tasks hindered by severity level of dysfluency Automatic speech: month of year: intact  Repetition: Appears intact Naming:  Appears intact Pragmatics: Impaired: abnormal effect and dysprosody Comments: Pt's language skills appear WNL however pragmatically pt's severity level of dysfluency and peculiar prosody due to decr'd vocal control area alarming to listeners. Socially this has and will cont to hinder pt's verbal communication. Pt was able to write a paragraph with WNL language, re: her responsibilities and situation surrounding her work at [employer]. Non-verbal means of communication: gestures and writing  WRITTEN EXPRESSION: Dominant hand: right  Written expression: Appears intact  MOTOR SPEECH: Overall motor speech: impaired Level of impairment: Word, Phrase, Sentence, and Conversation Respiration:  speaking on residual capacity and level of dysfluency hindered timing of respiratory system, articulation, and voicing Phonation: normal Resonance:  abnormal Articulation:  no dysarthria was appreciated Intelligibility: Intelligible Motor planning: Impaired: aware and inconsistent Motor speech errors: inconsistent Interfering components:  psychological impact of dysfluency and cognitive deficits Effective technique:  internal relaxation - when pt appeared least anxious and internally calm, minimal phoneme and syllable repetitions were heard (frequency of approx 2 relaxed reps/utterance with 2-3 reps a piece).  Pt was unable to decr frequency and severity of dysfluency in choral vocal tasks, nor was able to decr severity/frequency in singing tasks.   ORAL MOTOR EXAMINATION Overall status: Did not assess Comments: due to time constraints   PATIENT REPORTED OUTCOME MEASURES (PROM): Communication Participation Item Bank:   and Communication Effectiveness Survey: were provided today and will be repeated next session.   TODAY'S TREATMENT:  10/12/21: SLP engaged pt in conversation stressing relaxed speech, and a relaxed environment. Pt demonstrated pseudobulbar crying/emotional lability x4 during today's session. In two instances, pt stated she was unaware why she was crying. Pt had approx 5 minutes total throughout session when she had 3-4 reps of even syllable or word reps once-twice an utterance, without secondary behaviors. Other than this, pt demonstrated multiple reps (sometimes 10+) of both even and uneven phoneme, partial syllable, syllable, word, or phrase reps with secondary behaviors. SLP explained to pt that it appears that pt has mild dysfluency post fall, but her level of anxiety and stress exacerbate her dysfluency to debilitating levels. SLP gave pt homework for checking with RN case manager about psychologist/counselor for pt to discuss changes  post fall, and to take at least 5 times  during the day when she can take a brain break and "decompress" with music, or scripture, or music and scripture in order to decr her anxiety and/or frustration - whatever feelings are impeding her ability to communicate more effectively. SLP provided PROM and pt to return next session.   10/07/21: SLP explained results of SLP assessment. SLP shared that assistance could be provided in reducing pt's severity of dysfluency by using some fluency enhancing strategies and in-block modification, but that SLP believes that there is definitely a psychological component to her disorder which would require psychological assistance.   PATIENT EDUCATION: Education details: as above in today's treatment Person educated: Patient Education method: Explanation and Demonstration Education comprehension: verbalized understanding, returned demonstration, and needs further education     GOALS: Goals reviewed with patient? Yes, in general  SHORT TERM GOALS: Target date: 11/04/2021    Pt will demo easy onset of voicing with vowel+consonant syllables and words 60% success in 2 sessions Baseline: Goal status: Ongoing  2.  Pt will demonstrate slowed speech rate when reading words and phrases, in 4 sessions Baseline:  Goal status: Ongoing  3.  When experiencing multiple reps during reading words, pt will cease verbalization and take a "refresher/cleansing" breath, in 50% of opportunities  Baseline:  Goal status: Ongoing  4.  Pt will seek out psychological therapy/treatment dealing with her life changes post-fall, including changes in speech pattern Baseline:  Goal status: Ongoing  5.  Pt will tell SLP 2 new practical memory and attention compensations she could use, in 3 sessions Baseline:  Goal status: Ongoing  6.  Pt will complete PROM as listed above Baseline:  Goal status: Ongoing  LONG TERM GOALS: Target date:  12-03-21     Pt will demo easy onset of voicing with 2-3 syllable stimuli 60%  success in 2 sessions Baseline:  Goal status: Ongoing  2.  Pt will demonstrate slowed speech rate when reading 5-6 word sentences, in 3 sessions Baseline:  Goal status: Ongoing  3.  When experiencing multiple reps, pt will cease verbalization and use a "reset" technique, in 70% of opportunities Baseline:  Goal status: Ongoing  4.  Pt will tell SLP about or demo successful use of memory compensations in 3 sessions Baseline:  Goal status: Ongoing  5.  Pt will tell SLP about or demo successful use of attention compensations in 3 sessions Baseline:  Goal status: Ongoing  6.  Pt will score higher QOL on PROMs than on initial administration Baseline:  Goal status: Ongoing  ASSESSMENT:  CLINICAL IMPRESSION: Patient is a 66 y.o. female who was seen today for assessment of speech/fluency changes since two weeks after a fall in April. At this time SLP is unsure if pt's deficits are neurogenic, psychogenic, or both. Regardless, pt's deficits are significantly negatively impacting her verbal communication - she is able to adequately write out messages but this is very inefficient and impossible while using the telephone. SEE TX NOTE. Additionally, pt is reporting sx often reported with attention deficits and memory deficits. She is currently undergoing neuropsychiatric testing at [facility] in [place]. SLP is attempting to obtain those records in order to explore incorporating those results into pt's therapy plan .   OBJECTIVE IMPAIRMENTS include attention, memory, expressive language, and dysfluency . These impairments are limiting patient from return to work, managing medications, managing appointments, managing finances, household responsibilities, ADLs/IADLs, and effectively communicating at home and in community.  Factors affecting potential to achieve goals and functional outcome are ability to learn/carryover information, cooperation/participation level, severity of  impairments, and anxiety level, and low frustration tolerance . Patient will benefit from skilled SLP services to address above impairments and improve overall function.  REHAB POTENTIAL: Fair given psychological component to pt's disorder exacerbating her dysfluency sx.  PLAN: SLP FREQUENCY: 2x/week  SLP DURATION: 8 weeks  PLANNED INTERVENTIONS: Environmental controls, Cueing hierachy, Cognitive reorganization, Internal/external aids, Oral motor exercises, Functional tasks, Multimodal communication approach, SLP instruction and feedback, Compensatory strategies, and Patient/family education    [facility], [place] 10/12/2021, 5:40 PM

## 2021-10-19 ENCOUNTER — Ambulatory Visit: Payer: No Typology Code available for payment source | Attending: Physician Assistant

## 2021-10-19 DIAGNOSIS — R41841 Cognitive communication deficit: Secondary | ICD-10-CM | POA: Insufficient documentation

## 2021-10-19 DIAGNOSIS — F985 Adult onset fluency disorder: Secondary | ICD-10-CM | POA: Insufficient documentation

## 2021-10-19 DIAGNOSIS — R4789 Other speech disturbances: Secondary | ICD-10-CM | POA: Insufficient documentation

## 2021-10-19 NOTE — Therapy (Signed)
OUTPATIENT SPEECH LANGUAGE PATHOLOGY TREATMENT SESSION   Patient Name: Andrea Blackwell MRN: [id number] DOB:Nov 01, 1955, 66 y.o., female Today's Date: 10/19/2021  PCP: Andrea Rankins, NP REFERRING PROVIDER: Arnoldo Lenis, PA-C ([place])   End of Session - 10/19/21 2221     Visit Number 3    Number of Visits 17    Date for SLP Re-Evaluation 12/03/21    SLP Start Time 1619    SLP Stop Time  9381    SLP Time Calculation (min) 46 min    Activity Tolerance Other (comment)   limited due to lability/tearfullness, anxiety, and level of dysfluency             Past Medical History:  Diagnosis Date   Allergy    Anxiety    Cataract    Depression    Diabetes mellitus without complication ([place])    GERD (gastroesophageal reflux disease)    Hypothyroidism    Pancreatitis    Past Surgical History:  Procedure Laterality Date   CHOLECYSTECTOMY     ENDOSCOPIC RETROGRADE CHOLANGIOPANCREATOGRAPHY (ERCP) WITH PROPOFOL N/A 02/02/2017   Procedure: ENDOSCOPIC RETROGRADE CHOLANGIOPANCREATOGRAPHY (ERCP) WITH PROPOFOL;  Surgeon: Andrea Banister, MD;  Location: [facility];  Service: Endoscopy;  Laterality: N/A;   ERCP N/A 12/10/2016   Procedure: ENDOSCOPIC RETROGRADE CHOLANGIOPANCREATOGRAPHY (ERCP);  Surgeon: Andrea Ada, MD;  Location: [facility];  Service: Endoscopy;  Laterality: N/A;   ERCP N/A 12/14/2016   Procedure: ENDOSCOPIC RETROGRADE CHOLANGIOPANCREATOGRAPHY (ERCP);  Surgeon: Andrea Banister, MD;  Location: [facility];  Service: Endoscopy;  Laterality: N/A;   LAPAROSCOPIC CHOLECYSTECTOMY SINGLE SITE WITH INTRAOPERATIVE CHOLANGIOGRAM N/A 12/13/2016   Procedure: LAPAROSCOPIC CHOLECYSTECTOMY SINGLE SITE AND LIVER BIOPSY;  Surgeon: Andrea Boston, MD;  Location: [facility];  Service: General;  Laterality: N/A;   OVARY SURGERY     Patient Active Problem List   Diagnosis Date Noted   Encounter for removal of biliary stent    Anxiety and depression 02/01/2017   Bile  duct leak    Acute calculous cholecystitis 12/13/2016   Jaundice 12/13/2016   Obesity 12/13/2016   Hypothyroidism    Gallstone pancreatitis 12/10/2016    ONSET DATE: 05/25/21   REFERRING DIAG: O17.5Z0C (ICD-10-CM) - Concussion without loss of consciousness, initial encounter 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 It's been been been been been been a been a  been a been a hard day. I I I I I I started new pills Friday, I think."  Pt accompanied by: self  PERTINENT HISTORY: STEPHANY Blackwell is a 27 y.o. with premorbid PTSD, anxiety seen in the [facility] emergency department on 05/25/21 after mechanical fall and head injury, had a CT scan at that time of the brain and cervical spine which did not show acute injuries, was diagnosed with a concussion at that time. Second ED visit to [facility] ED 06/28/21 at which time she reported that ever since then she has had severe persistent daily headaches, photophobia, there is also had pain and difficulty with her speech and thinking, reports "every time I try to think I can't do it," reports that she has difficulty forming words, she repeats herself, this has severely impacted her ability to work.  She does not have any family in  the area reports she has a church group that checks in on her.  She reports she has seen an orthopedic doctor as well as a concussion specialist, and were told that the symptoms can be expected from a concussion and that she needs to see a neurologist.  Pt is currently undergoing neuropsychiatric testing in [facility]. SLP to attempt to obtain those records.   LIVING ENVIRONMENT: Lives with: lives alone with daily check-ins from church  friends Lives in: House/apartment  PLOF:  Level of assistance: Independent with ADLs Employment: Part-time employment   PATIENT GOALS  "Get back to being me again."  OBJECTIVE:   DIAGNOSTIC FINDINGS:  MRI Brain Without Contrast IMPRESSION: No acute intracranial process. No hemosiderin deposition to suggest remote hemorrhage or diffuse axonal injury.   COGNITION: Overall cognitive status: Difficulty to assess due to: emotional lability and dysfluency. Areas of impairment:  Attention: Impaired: Comment: TBD after more testing Memory: Impaired: Short term Prospective TBD after more testing Behavior: Verbal agitation, Poor frustration tolerance, and Lability  Functional deficits:  [name] reports difficulty with recall and concentration. Decr'd ability with all areas was seen today to exacerbate her dysfluency which lead to more frustration and then worse dysfluency, and so on. Eventually this lead to tears or the patient stopping verbalization and breathing and a second attempt at verbalization.    PATIENT REPORTED OUTCOME MEASURES (PROM): Communication Participation Item Bank:   and Communication Effectiveness Survey: were provided today and will be repeated next session.   TODAY'S TREATMENT:  10/19/21: Pt did not return PROM due to it was reportedly difficult to complete emotionally - "Everything was on this side (pt waving hand to right - decr'd QOL responses are on the right side). SLP encouraged pt to complete 5-6 questions at one time until completed and then bring back to SLP. SLP educated pt on rationale for PROM. Pt tearful x11 today. Pt visibly internally tense and anxious so SLP asked pt play music that would calm her down internally and to close her eyes to aid in relaxation. She was tearful 60 seconds into task saying "I've never done this before." SLP then suggested playing a natural sound that would assist her internal calm/peace and she chose thunderstorm. SLP guided pt  through progressive relaxation for approx 8 minutes and then asked pt to keep lips closed and vocalize humming. Pt was dysfluent on the hum, and then began crying. SLP asked pt where her tension was and she indicated her entire face and oral and pharyngeal space. Pt indicated she was not tense and tight anywhere prior to being asked to hum but stated (with dysfluency and tears) "I didn't think I could do it right and then I felt tense." SLP told pt that this difficulty she is having is both psychological and neurological, but that the psychological is greatly increasing the severity of the difficulty (in this SLP's opinion), and that she must  receive psychological therapy/assistance before we will see much progress with her dysfluency.   10/12/21: SLP engaged pt in conversation stressing relaxed speech, and a relaxed environment. Pt demonstrated pseudobulbar crying/emotional lability x4 during today's session. In two instances, pt stated she was unaware why she was crying. Pt had approx 5 minutes total throughout session when she had 3-4 reps of even syllable or word reps once-twice an utterance, without secondary behaviors. Other than this, pt demonstrated multiple reps (sometimes 10+) of both even and uneven phoneme, partial syllable, syllable, word, or phrase reps with secondary behaviors. SLP  explained to pt that it appears that pt has mild dysfluency post fall, but her level of anxiety and stress exacerbate her dysfluency to debilitating levels. SLP gave pt homework for checking with RN case manager about psychologist/counselor for pt to discuss changes post fall, and to take at least 5 times during the day when she can take a brain break and "decompress" with music, or scripture, or music and scripture in order to decr her anxiety and/or frustration - whatever feelings are impeding her ability to communicate more effectively. SLP provided PROM and pt to return next session.   10/07/21: SLP explained results  of SLP assessment. SLP shared that assistance could be provided in reducing pt's severity of dysfluency by using some fluency enhancing strategies and in-block modification, but that SLP believes that there is definitely a psychological component to her disorder which would require psychological assistance.   PATIENT EDUCATION: Education details: as above in today's treatment Person educated: Patient Education method: Explanation and Demonstration Education comprehension: verbalized understanding, returned demonstration, and needs further education     GOALS: Goals reviewed with patient? Yes, in general  SHORT TERM GOALS: Target date: 11/04/2021    Pt will demo easy onset of voicing with vowel+consonant syllables and words 60% success in 2 sessions Baseline: Goal status: Ongoing  2.  Pt will demonstrate slowed speech rate when reading words and phrases, in 4 sessions Baseline:  Goal status: Ongoing  3.  When experiencing multiple reps during reading words, pt will cease verbalization and take a "refresher/cleansing" breath, in 50% of opportunities  Baseline:  Goal status: Ongoing  4.  Pt will seek out psychological therapy/treatment dealing with her life changes post-fall, including changes in speech pattern Baseline:  Goal status: Ongoing  5.  Pt will tell SLP 2 new practical memory and attention compensations she could use, in 3 sessions Baseline:  Goal status: Ongoing  6.  Pt will complete PROM as listed above Baseline:  Goal status: Ongoing  LONG TERM GOALS: Target date:  12-03-21     Pt will demo easy onset of voicing with 2-3 syllable stimuli 60% success in 2 sessions Baseline:  Goal status: Ongoing  2.  Pt will demonstrate slowed speech rate when reading 5-6 word sentences, in 3 sessions Baseline:  Goal status: Ongoing  3.  When experiencing multiple reps, pt will cease verbalization and use a "reset" technique, in 70% of opportunities Baseline:  Goal  status: Ongoing  4.  Pt will tell SLP about or demo successful use of memory compensations in 3 sessions Baseline:  Goal status: Ongoing  5.  Pt will tell SLP about or demo successful use of attention compensations in 3 sessions Baseline:  Goal status: Ongoing  6.  Pt will score higher QOL on PROMs than on initial administration Baseline:  Goal status: Ongoing  ASSESSMENT:  CLINICAL IMPRESSION: Patient is a 66 y.o. female who was seen today for therapy for speech/fluency changes since two weeks after a fall in April. At this time SLP believes pt's deficits are both neurogenic, and psychogenic. It is this SLP's opinion that progress with dysfluency will be limited without psychological therapy either initially, or concurrently. Pt was very frequently tearful today and this along with anxiety and level of dysfluency limits what SLP can do during treatment. Pt stated today she could not complete a QOL questionnaire of ~ 20 questions because it was too emotionally difficult. Pt's deficits are significantly negatively impacting her verbal communication - she is able to  adequately write out messages but this is very inefficient and impossible while using the telephone. SEE TX NOTE. Additionally, pt is reporting sx often reported with attention deficits and memory deficits. She is currently undergoing neuropsychiatric testing at [facility] in [place]. SLP is attempting to obtain those records in order to have more of an idea about pt's feasibility to cont skilled ST without psych assistance.   OBJECTIVE IMPAIRMENTS include attention, memory, expressive language, and dysfluency . These impairments are limiting patient from return to work, managing medications, managing appointments, managing finances, household responsibilities, ADLs/IADLs, and effectively communicating at home and in community. Factors affecting potential to achieve goals and functional outcome are ability to  learn/carryover information, cooperation/participation level, severity of impairments, and anxiety level, and low frustration tolerance . Patient will benefit from skilled SLP services to address above impairments and improve overall function.  REHAB POTENTIAL: Fair given psychological component to pt's disorder exacerbating her dysfluency sx.  PLAN: SLP FREQUENCY: 2x/week  SLP DURATION: 8 weeks  PLANNED INTERVENTIONS: Environmental controls, Cueing hierachy, Cognitive reorganization, Internal/external aids, Oral motor exercises, Functional tasks, Multimodal communication approach, SLP instruction and feedback, Compensatory strategies, and Patient/family education    [facility] [place], [place] 10/19/2021, 10:22 PM

## 2021-10-21 ENCOUNTER — Ambulatory Visit: Payer: No Typology Code available for payment source

## 2021-10-21 DIAGNOSIS — R4789 Other speech disturbances: Secondary | ICD-10-CM | POA: Diagnosis not present

## 2021-10-21 DIAGNOSIS — R41841 Cognitive communication deficit: Secondary | ICD-10-CM

## 2021-10-21 DIAGNOSIS — F985 Adult onset fluency disorder: Secondary | ICD-10-CM

## 2021-10-21 NOTE — Patient Instructions (Signed)
Emotional Lability, Anger, and Impulsiveness A TBI can change the way you feel or express emotions. Often you may experience emotional lability, mood swings or strong feelings like anger.   Emotional Lability is rapid exaggerated changes in mood. You may find yourself finding very strong emotions and feelings.    Emotional lability often occurs after a TBI especially if there is damage to the area of your brain that controls your emotions and behaviors. Often there is no specific event that triggers a sudden emotional response. In some cases, you can experience sudden episodes of laughing or crying. This may be confusing for friends and family who think they accidentally did something that upset you. These emotional expressions may not have any connection to the way the person ACTUALLY feels (for example you can cry without feeling sad or laugh without feeling happy). In some cases, the emotions may not match the situation - such as laughing at a sad story.    It's important for you, and your support system, to know that often you cannot control these expressions of emotion and you may feel like you are an Statistician.   Let's talk about triggers.  A trigger is something that sets off a reaction in you. Triggers can be internal (something that comes from a thought or an emotion you create) or external (something in the outside world that causes you to react). The most common trigger for those who have experienced a TBI is overstimulation and/or sensory overload.    Common triggers of emotional lability:  Overstimulation  Excessive fatigue or tiredness  Stress, worry, or anxiety  Strong emotions or demands from others  Very sad or funny situations, such as jokes, movies, books  Discussing certain topics, such as driving, loss of job, relationships, death of family member  Strategies for coping with emotional lability    Become aware of triggers and try to avoid them. Consider keeping  a mood log to help track your emotions and pinpoint your triggers.  Take a break from the situation or person so you can regain control of your emotions  Try to ignore the behavior and suggest your support system does the same. By focusing on the lability and giving it too much attention it could increase the problem.  Change the topic or task to something less stressful or triggering.  Plan ahead and know your limits. Make sure you're well-rested and avoid putting yourself in a situation with high stress, high demands, and overstimulation.  Deep breathing and/or relaxation techniques to reduce stress and reset yourself   Be aware of your emotional triggers  As mentioned above, a mood log can help you pinpoint and identify your triggers. Here are some things to add to your mood log or mood journal.  What happened just before the behavior started?  Were there other people involved when the behavior occurred?  Where did it occur?  What is happening in your living space?  Is this a new behavior?  Are there certain actions that make it worse?  Are you trying to communicate a need or desire?  Are there any patterns you can see? For example, is there a time of day, events such as shift changes, a particular caregiver or visitor, substances like drugs or alcohol, sugar or caffeine, or medications that can be a trigger? When you are experiencing a trigger, ask yourself. . .  What type of situation are you in? Are you safe?  What is happening around you?  What kind of emotions are you feeling?  What thoughts are you having?  What does your body feel like?   Remember that emotional lability is a common challenge after a traumatic brain injury. You may be experiencing a loss of work, ability to drive, independence, changes in your relationships or finances - and there are a lot of emotions that come along with these losses and changes. Feelings of sadness, grief, anger, frustration, disappointment, or  depression after a TBI are common. Seek help from a counselor, psychologist, social worker, trusted friend, or family member to help you navigate and manage these feelings.    =============================================   Understanding the emotional impact of brain injury Constant Therapy  Traumatic brain injury, Concussion, Brain health In the wake of a brain injury, emotional changes are common. About 31% of stroke survivors, and about 50% of people recovering from a moderate to severe TBI experience episodes of depression. Rates are even higher for people with post-stroke aphasia at about 68%. These emotional changes can be a result of physical brain damage caused by the injury, or can be related to the life-changing impact that these injuries often have on the survivor and their loved ones.   It's important to note that everyone is different, and emotional experiences after brain injury may vary in severity. The intent of this article is to normalize the emotional side effects of brain injury, and bring awareness to this aspect of the recovery journey. Hopefully, when the emotional waves present themselves, you or your loved one will feel better equipped to face them.     How brain injury can cause emotional difficulties  Physical damage to the brain can result from a traumatic injury like from blunt force to the head, or an acquired injury like a stroke. These kinds of injuries cause physical damage to the brain tissue, which affects a wide range of functions, namely speech, language and cognitive skills, but emotional challenges can arise as well.    Location of physical damage   Depending on the region of the brain that is affected, emotional functions can be impacted. For example, when there is damage to the frontal lobe (the front part of your brain), personality and emotional changes can occur. As a brain injury survivor, you may experience mood-swings, or have a difficult time regulating  your emotional responses to your environment (think being pushed to tears more easily). This dysregulation and lack of emotional control can often feel alarming, and can trigger feelings of frustration, overwhelm, or even anxiety.  Impact on daily functions   Similarly, when an injury impacts a survivor's daily function and activities, it can trigger a cascade of heavy emotions. For people with Broca's aphasia, the conscious effort it takes to get the right word out can be incredibly frustrating. For brain injury survivors, sensory overload from things like lights, background noise, or lots of movement can easily occur. In both these examples, a heightened stress response is typical. A recovering brain is hypersensitive to strain or overwhelm. So be gentle. Try to relate to these emotional reactions as signals to take a break. There can be profound feelings of fear and helplessness at the beginning of rehabilitation. These feelings are normal and fully warranted.  Survivors can also experience "incomplete mourning"    Survivors can often feel, especially in the beginning stages of recovery, that they are no longer themselves. The contrast of pre and post-injury life can bring up feelings of loss. In embracing a new reality  post-injury, a grieving process may be necessary. A difficult aspect of recovery is the feeling of what's called "incomplete mourning", where some aspects of  a survivor's life are very much the same, and some things are completely different. The change and ambiguity is difficult to process, but this emotional reckoning is a common, even foundational, part of any rehabilitation journey.    The end goal is acceptance  The last step in any grief process is acceptance. Acceptance means no longer resisting the reality of the situation, and no longer trying to make it different. Sadness, uncertainty, fear -- all these emotions can still be present, but in a state of acceptance, feelings of  loss are more manageable. Acceptance might not come immediately, but it is a great emotional goal to aim for.    Even with these changes it's critical to remember that as a survivor, you are still you! Though there are a lot of changes that come in the wake of a brain injury. A critical thing to remember is that pre and post injury, you are still you. Your recovery journey is a trajectory that will offer new challenges and triumphs. And though there will be moments where emotion runs high, remember, there is agency in this process! As a survivor, you get to choose what your new goals are, the kind of  life you want to lead, what kind of care you want to receive, and who your support system includes. You are still at the helm of your life.         Last Modified by Sharen Counter, CCC-SLP on 10/21/2021 at  4:47 PM

## 2021-10-22 NOTE — Therapy (Signed)
OUTPATIENT SPEECH LANGUAGE PATHOLOGY TREATMENT SESSION   Patient Name: Andrea Blackwell MRN: [id number] DOB:[birth year], 66 y.o., female Today's Date: 10/22/2021  PCP: [name], NP REFERRING PROVIDER: Arnoldo Lenis, PA-C ([facility])   End of Session - 10/22/21 0117     Visit Number 4    Number of Visits 17    Date for SLP Re-Evaluation 12/03/21    SLP Start Time 1620    SLP Stop Time  6387    SLP Time Calculation (min) 34 min    Activity Tolerance Other (comment)   limited due to lability/tearfullness, anxiety, and level of dysfluency             Past Medical History:  Diagnosis Date   Allergy    Anxiety    Cataract    Depression    Diabetes mellitus without complication ([place])    GERD (gastroesophageal reflux disease)    Hypothyroidism    Pancreatitis    Past Surgical History:  Procedure Laterality Date   CHOLECYSTECTOMY     ENDOSCOPIC RETROGRADE CHOLANGIOPANCREATOGRAPHY (ERCP) WITH PROPOFOL N/A 02/02/2017   Procedure: ENDOSCOPIC RETROGRADE CHOLANGIOPANCREATOGRAPHY (ERCP) WITH PROPOFOL;  Surgeon: Milus Banister, MD;  Location: [facility];  Service: Endoscopy;  Laterality: N/A;   ERCP N/A 12/10/2016   Procedure: ENDOSCOPIC RETROGRADE CHOLANGIOPANCREATOGRAPHY (ERCP);  Surgeon: Carol Ada, MD;  Location: [facility];  Service: Endoscopy;  Laterality: N/A;   ERCP N/A 12/14/2016   Procedure: ENDOSCOPIC RETROGRADE CHOLANGIOPANCREATOGRAPHY (ERCP);  Surgeon: Milus Banister, MD;  Location: [facility];  Service: Endoscopy;  Laterality: N/A;   LAPAROSCOPIC CHOLECYSTECTOMY SINGLE SITE WITH INTRAOPERATIVE CHOLANGIOGRAM N/A 12/13/2016   Procedure: LAPAROSCOPIC CHOLECYSTECTOMY SINGLE SITE AND LIVER BIOPSY;  Surgeon: Michael Boston, MD;  Location: [facility];  Service: General;  Laterality: N/A;   OVARY SURGERY     Patient Active Problem List   Diagnosis Date Noted   Encounter for removal of biliary stent    Anxiety and depression 02/01/2017   Bile  duct leak    Acute calculous cholecystitis 12/13/2016   Jaundice 12/13/2016   Obesity 12/13/2016   Hypothyroidism    Gallstone pancreatitis 12/10/2016    ONSET DATE: 05/25/21   REFERRING DIAG: F64.3P2R (ICD-10-CM) - Concussion without loss of consciousness, initial encounter 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  Pt accompanied by: self  PERTINENT HISTORY: Andrea Blackwell is a 66 y.o. with premorbid PTSD, anxiety seen in the [facility] emergency department on 05/25/21 after mechanical fall and head injury, had a CT scan at that time of the brain and cervical spine which did not show acute injuries, was diagnosed with a concussion at that time. Second ED visit to [facility] ED 06/28/21 at which time she reported that ever since then she has had severe persistent daily headaches, photophobia, there is also had pain and difficulty with her speech and thinking, reports "every time I try to think I can't do it," reports that she has difficulty forming words, she repeats herself, this has severely impacted her ability to work.  She does not have any family in the area reports she has a church group that checks in on her.  She reports she has seen an orthopedic doctor as well as a concussion specialist, and were told that  the symptoms can be expected from a concussion and that she needs to see a neurologist.  Pt is currently undergoing neuropsychiatric testing in [facility]. SLP to attempt to obtain those records.   LIVING ENVIRONMENT: Lives with: lives alone with daily check-ins from church friends Lives in: House/apartment  PLOF:  Level of assistance: Independent with ADLs Employment: Part-time  employment   PATIENT GOALS  "Get back to being me again."  OBJECTIVE:   DIAGNOSTIC FINDINGS:  MRI Brain Without Contrast IMPRESSION: No acute intracranial process. No hemosiderin deposition to suggest remote hemorrhage or diffuse axonal injury.   COGNITION: Overall cognitive status: Difficulty to assess due to: emotional lability and dysfluency. Areas of impairment:  Attention: Impaired: Comment: TBD after more testing Memory: Impaired: Short term Prospective TBD after more testing Behavior: Verbal agitation, Poor frustration tolerance, and Lability  Functional deficits:  [name] reports difficulty with recall and concentration. Decr'd ability with all areas was seen today to exacerbate her dysfluency which lead to more frustration and then worse dysfluency, and so on. Eventually this lead to tears or the patient stopping verbalization and breathing and a second attempt at verbalization.    PATIENT REPORTED OUTCOME MEASURES (PROM): Communication Participation Item Bank:   and Communication Effectiveness Survey: were provided today and will be repeated next session.   TODAY'S TREATMENT:  10/21/21: SLP worked with pt on compensations for dysfluency. Pt limited/no success, which brought on tearful response/s. SLP explained to pt that SLP thinks that pt requires psychological counseling prior to any more skilled ST. SLP inquired if pt had completed PROM but she had not due to inability to fill out entire form due to high emotions. SLP encouraged pt to fill this out and return it.  Pt called her nurse case manager and she had already called her re: psych services. Case manager going to check what might be possible.   10/19/21: Pt did not return PROM due to it was reportedly difficult to complete emotionally - "Everything was on this side (pt waving hand to right - decr'd QOL responses are on the right side). SLP encouraged pt to complete 5-6 questions at one time until completed and then bring  back to SLP. SLP educated pt on rationale for PROM. Pt tearful x11 today. Pt visibly internally tense and anxious so SLP asked pt play music that would calm her down internally and to close her eyes to aid in relaxation. She was tearful 60 seconds into task saying "I've never done this before." SLP then suggested playing a natural sound that would assist her internal calm/peace and she chose thunderstorm. SLP guided pt through progressive relaxation for approx 8 minutes and then asked pt to keep lips closed and vocalize humming. Pt was dysfluent on the hum, and then began crying. SLP asked pt where her tension was and she indicated her entire face and oral and pharyngeal space. Pt indicated she was not tense and tight anywhere prior to being asked to hum but stated (with dysfluency and tears) "I didn't think I could do it right and then I felt tense." SLP told pt that this difficulty she is having is both psychological and neurological, but that the psychological is greatly increasing the severity of the difficulty (in this SLP's opinion), and that she must  receive psychological therapy/assistance before we will see much progress with her dysfluency.   10/12/21: SLP engaged pt in conversation stressing relaxed speech, and a relaxed environment. Pt demonstrated pseudobulbar crying/emotional lability x4 during today's session.  In two instances, pt stated she was unaware why she was crying. Pt had approx 5 minutes total throughout session when she had 3-4 reps of even syllable or word reps once-twice an utterance, without secondary behaviors. Other than this, pt demonstrated multiple reps (sometimes 10+) of both even and uneven phoneme, partial syllable, syllable, word, or phrase reps with secondary behaviors. SLP explained to pt that it appears that pt has mild dysfluency post fall, but her level of anxiety and stress exacerbate her dysfluency to debilitating levels. SLP gave pt homework for checking with RN case  manager about psychologist/counselor for pt to discuss changes post fall, and to take at least 5 times during the day when she can take a brain break and "decompress" with music, or scripture, or music and scripture in order to decr her anxiety and/or frustration - whatever feelings are impeding her ability to communicate more effectively. SLP provided PROM and pt to return next session.   10/07/21: SLP explained results of SLP assessment. SLP shared that assistance could be provided in reducing pt's severity of dysfluency by using some fluency enhancing strategies and in-block modification, but that SLP believes that there is definitely a psychological component to her disorder which would require psychological assistance.   PATIENT EDUCATION: Education details: as above in today's treatment Person educated: Patient Education method: Explanation and Demonstration Education comprehension: verbalized understanding, returned demonstration, and needs further education     GOALS: Goals reviewed with patient? Yes, in general  SHORT TERM GOALS: Target date: 11/04/2021    Pt will demo easy onset of voicing with vowel+consonant syllables and words 40% success in 2 sessions Baseline: Goal status: Revised  2.  Pt will demonstrate slowed speech rate when reading words and phrases, in 4 sessions Baseline:  Goal status: Ongoing  3.  When experiencing multiple reps during reading words, pt will cease verbalization and take a "refresher/cleansing" breath, in 40% of opportunities  Baseline:  Goal status: Revised  4.  Pt will seek out psychological therapy/treatment dealing with her life changes post-fall, including changes in speech pattern Baseline:  Goal status: Ongoing  5.  Pt will tell SLP 2 new practical memory and attention compensations she could use, in 3 sessions Baseline:  Goal status: Ongoing  6.  Pt will complete PROM as listed above Baseline:  Goal status: Ongoing  LONG TERM  GOALS: Target date:  12-03-21     Pt will demo easy onset of voicing with 2-3 syllable stimuli 60% success in 2 sessions Baseline:  Goal status: Ongoing  2.  Pt will demonstrate slowed speech rate when reading 5-6 word sentences, in 3 sessions Baseline:  Goal status: Ongoing  3.  When experiencing multiple reps, pt will cease verbalization and use a "reset" technique, in 70% of opportunities Baseline:  Goal status: Ongoing  4.  Pt will tell SLP about or demo successful use of memory compensations in 3 sessions Baseline:  Goal status: Ongoing  5.  Pt will tell SLP about or demo successful use of attention compensations in 3 sessions Baseline:  Goal status: Ongoing  6.  Pt will score higher QOL on PROMs than on initial administration Baseline:  Goal status: Ongoing  ASSESSMENT:  CLINICAL IMPRESSION: Patient is a 66 y.o. female who was seen today for therapy for speech/fluency changes since two weeks after a fall in April as well as compensations for attention and memory. At this time SLP believes pt's dysfluency has a neurogenic component, but is mostly psychogenic. It  is this SLP's opinion that progress with dysfluency will be limited without initial psychological therapy. Pt was again very frequently tearful today and this along with anxiety and level of dysfluency limits what SLP could do during treatment. Pt stated today she could not complete a QOL questionnaire of ~ 20 questions because it was too emotionally difficult. Pt's deficits are significantly negatively impacting her verbal communication - she is able to adequately write out messages but this is very inefficient and impossible while using the telephone. SEE TX NOTE. Additionally, pt is reporting sx often reported with attention deficits and memory deficits. She is currently undergoing neuropsychiatric testing at [facility] in [place]. SLP is attempting to obtain those records in order to have more of  an idea about pt's feasibility to cont skilled ST without psych assistance.   OBJECTIVE IMPAIRMENTS include attention, memory, expressive language, and dysfluency . These impairments are limiting patient from return to work, managing medications, managing appointments, managing finances, household responsibilities, ADLs/IADLs, and effectively communicating at home and in community. Factors affecting potential to achieve goals and functional outcome are ability to learn/carryover information, cooperation/participation level, severity of impairments, and anxiety level, and low frustration tolerance . Patient will benefit from skilled SLP services to address above impairments and improve overall function.  REHAB POTENTIAL: Fair given psychological component to pt's disorder exacerbating her dysfluency sx.  PLAN: SLP FREQUENCY: 2x/week  SLP DURATION: 8 weeks  PLANNED INTERVENTIONS: Environmental controls, Cueing hierachy, Cognitive reorganization, Internal/external aids, Oral motor exercises, Functional tasks, Multimodal communication approach, SLP instruction and feedback, Compensatory strategies, and Patient/family education    [facility], [place] 10/22/2021, 1:18 AM

## 2021-10-26 ENCOUNTER — Ambulatory Visit: Payer: No Typology Code available for payment source

## 2021-10-28 ENCOUNTER — Ambulatory Visit: Payer: No Typology Code available for payment source

## 2021-10-28 DIAGNOSIS — R4789 Other speech disturbances: Secondary | ICD-10-CM | POA: Diagnosis not present

## 2021-10-28 DIAGNOSIS — R41841 Cognitive communication deficit: Secondary | ICD-10-CM

## 2021-10-28 DIAGNOSIS — F985 Adult onset fluency disorder: Secondary | ICD-10-CM

## 2021-10-28 NOTE — Therapy (Signed)
OUTPATIENT SPEECH LANGUAGE PATHOLOGY TREATMENT SESSION   Patient Name: Andrea [facility] MRN: [id number] DOB:Jul 24, 1955, 66 y.o., female Today's Date: 10/28/2021  PCP: Andrea Rankins, NP REFERRING PROVIDER: Arnoldo Lenis, PA-C ([place])   End of Session - 10/28/21 0117     Visit Number 5    Number of Visits 17    Date for SLP Re-Evaluation 12/03/21    SLP Start Time 1620   SLP Stop Time   1700   SLP Time Calculation (min)  40 min    Activity Tolerance Other (comment)   limited due to lability/tearfulness, and severity of deficit (need for psych treatment)             Past Medical History:  Diagnosis Date   Allergy    Anxiety    Cataract    Depression    Diabetes mellitus without complication ([place])    GERD (gastroesophageal reflux disease)    Hypothyroidism    Pancreatitis    Past Surgical History:  Procedure Laterality Date   CHOLECYSTECTOMY     ENDOSCOPIC RETROGRADE CHOLANGIOPANCREATOGRAPHY (ERCP) WITH PROPOFOL N/A 02/02/2017   Procedure: ENDOSCOPIC RETROGRADE CHOLANGIOPANCREATOGRAPHY (ERCP) WITH PROPOFOL;  Surgeon: Andrea Banister, MD;  Location: [facility];  Service: Endoscopy;  Laterality: N/A;   ERCP N/A 12/10/2016   Procedure: ENDOSCOPIC RETROGRADE CHOLANGIOPANCREATOGRAPHY (ERCP);  Surgeon: Andrea Ada, MD;  Location: [facility];  Service: Endoscopy;  Laterality: N/A;   ERCP N/A 12/14/2016   Procedure: ENDOSCOPIC RETROGRADE CHOLANGIOPANCREATOGRAPHY (ERCP);  Surgeon: Andrea Banister, MD;  Location: [facility];  Service: Endoscopy;  Laterality: N/A;   LAPAROSCOPIC CHOLECYSTECTOMY SINGLE SITE WITH INTRAOPERATIVE CHOLANGIOGRAM N/A 12/13/2016   Procedure: LAPAROSCOPIC CHOLECYSTECTOMY SINGLE SITE AND LIVER BIOPSY;  Surgeon: Andrea Boston, MD;  Location: [facility];  Service: General;  Laterality: N/A;   OVARY SURGERY     Patient Active Problem List   Diagnosis Date Noted   Encounter for removal of biliary stent    Anxiety and depression  02/01/2017   Bile duct leak    Acute calculous cholecystitis 12/13/2016   Jaundice 12/13/2016   Obesity 12/13/2016   Hypothyroidism    Gallstone pancreatitis 12/10/2016    ONSET DATE: 05/25/21   REFERRING DIAG: U13.2G4W (ICD-10-CM) - Concussion without loss of consciousness, initial encounter 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 (Tearful)"  SLP attempted to contact [facility] and obtain records but recording said "having difficulty with incoming calls". Pt accompanied by: self  PERTINENT HISTORY: Andrea Blackwell is a 66 y.o. with premorbid PTSD, anxiety seen in the [facility] emergency department on 05/25/21 after mechanical fall and head injury, had a CT scan at that time of the brain and cervical spine which did not show acute injuries, was diagnosed with a concussion at that time. Second ED visit to [facility] ED 06/28/21 at which time she reported that ever since then she has had severe persistent daily headaches, photophobia, there is also had pain and difficulty with her speech and thinking, reports "every time I try to think I can't do it," reports that she has difficulty forming words, she repeats herself, this has severely impacted her ability to work.  She does not have any family in the area  reports she has a church group that checks in on her.  She reports she has seen an orthopedic doctor as well as a concussion specialist, and were told that the symptoms can be expected from a concussion and that she needs to see a neurologist.  Pt is currently undergoing neuropsychiatric testing in [place]. SLP to attempt to obtain those records.   LIVING  ENVIRONMENT: Lives with: lives alone with daily check-ins from church friends Lives in: House/apartment  PLOF:  Level of assistance: Independent with ADLs Employment: Part-time employment   PATIENT GOALS  "Get back to being me again."  OBJECTIVE:   DIAGNOSTIC FINDINGS:  MRI Brain Without Contrast IMPRESSION: No acute intracranial process. No hemosiderin deposition to suggest remote hemorrhage or diffuse axonal injury.   COGNITION: (10/07/21) Overall cognitive status: Difficulty to assess due to: emotional lability and dysfluency. Areas of impairment:  Attention: Impaired: Comment: TBD after more testing Memory: Impaired: Short term Prospective TBD after more testing Behavior: Verbal agitation, Poor frustration tolerance, and Lability  Functional deficits:  [facility] reports difficulty with recall and concentration. Decr'd ability with all areas was seen today to exacerbate her dysfluency which lead to more frustration and then worse dysfluency, and so on. Eventually this lead to tears or the patient stopping verbalization and breathing and a second attempt at verbalization.    PATIENT REPORTED OUTCOME MEASURES (PROM): Communication Participation Item Bank:   and Communication Effectiveness Survey: were provided and pt cont to have difficulty returning them due to her feeling she is unable to emotionally deal with the results. SLP will cont to encourage pt to complete these measures.   TODAY'S TREATMENT:  9/21//23: Pt arrived and put tissues on therapy table. SLP inquired if [facility] had talked with her case manager (NCM) about psychological/psychiatric therapy services. She had called her case manager and reported her NCM was waiting from a return call from [facility]. "I'm trying not to talk as much because it stresses me out more." (Became tearful). After SLP assisted pt in calming down, she stated she brought pictures of her medication bottles today to show SLP. Therefore, SLP  discussed pt's timing of her medication with her today. [facility] endorsed possible (she was not completely sure) skips in her medication in the past 2 weeks. SLP put a grid in front of her with columns "Rx, AM, PM, bed" in order to organize her med administration times, and stated (severe dysfluency) "I can't do this," and became tearful. After pt calmed down SLP asked Imari how she could organize her meds so that she does not have skips and she suggested a medication box. SLP affirmed that thought and encouraged pt to purchase one. Pt noted to write down 8pm for meds that, on med bottle, said to take in AM. Pt noticed this and changed the administration time column on her grid.  Homework is to buy med box and to write down administration time after she checks the bottle she took a picture of that said "see label instructions" and she did not take a picture of the label. SLP got number of Jaretzy's NCM to contact to inquire of  Neuropsychiatry ability to see pt for therapy services following her evaluation there.  10/21/21: SLP worked with pt on compensations for dysfluency. Pt limited/no success, which brought on tearful response/s. SLP explained to pt that SLP thinks that pt requires psychological counseling prior to any more skilled ST. SLP inquired if pt had completed PROM but she had not due to  inability to fill out entire form due to high emotions. SLP encouraged pt to fill this out and return it.  Pt called her nurse case manager and she had already called her re: psych services. Case manager going to check what might be possible.   10/19/21: Pt did not return PROM due to it was reportedly difficult to complete emotionally - "Everything was on this side (pt waving hand to right - decr'd QOL responses are on the right side). SLP encouraged pt to complete 5-6 questions at one time until completed and then bring back to SLP. SLP educated pt on rationale for PROM. Pt tearful x11 today. Pt visibly internally tense and  anxious so SLP asked pt play music that would calm her down internally and to close her eyes to aid in relaxation. She was tearful 60 seconds into task saying "I've never done this before." SLP then suggested playing a natural sound that would assist her internal calm/peace and she chose thunderstorm. SLP guided pt through progressive relaxation for approx 8 minutes and then asked pt to keep lips closed and vocalize humming. Pt was dysfluent on the hum, and then began crying. SLP asked pt where her tension was and she indicated her entire face and oral and pharyngeal space. Pt indicated she was not tense and tight anywhere prior to being asked to hum but stated (with dysfluency and tears) "I didn't think I could do it right and then I felt tense." SLP told pt that this difficulty she is having is both psychological and neurological, but that the psychological is greatly increasing the severity of the difficulty (in this SLP's opinion), and that she must  receive psychological therapy/assistance before we will see much progress with her dysfluency.   10/12/21: SLP engaged pt in conversation stressing relaxed speech, and a relaxed environment. Pt demonstrated pseudobulbar crying/emotional lability x4 during today's session. In two instances, pt stated she was unaware why she was crying. Pt had approx 5 minutes total throughout session when she had 3-4 reps of even syllable or word reps once-twice an utterance, without secondary behaviors. Other than this, pt demonstrated multiple reps (sometimes 10+) of both even and uneven phoneme, partial syllable, syllable, word, or phrase reps with secondary behaviors. SLP explained to pt that it appears that pt has mild dysfluency post fall, but her level of anxiety and stress exacerbate her dysfluency to debilitating levels. SLP gave pt homework for checking with RN case manager about psychologist/counselor for pt to discuss changes post fall, and to take at least 5 times  during the day when she can take a brain break and "decompress" with music, or scripture, or music and scripture in order to decr her anxiety and/or frustration - whatever feelings are impeding her ability to communicate more effectively. SLP provided PROM and pt to return next session.   10/07/21: SLP explained results of SLP assessment. SLP shared that assistance could be provided in reducing pt's severity of dysfluency by using some fluency enhancing strategies and in-block modification, but that SLP believes that there is definitely a psychological component to her disorder which would require psychological assistance.   PATIENT EDUCATION: Education details: as above in today's treatment Person educated: Patient Education method: Explanation and Demonstration Education comprehension: verbalized understanding, returned demonstration, and needs further education     GOALS: Goals reviewed with patient? Yes, in general  SHORT TERM GOALS: Target date: 11/04/2021    Pt will demo easy onset of voicing with vowel+consonant syllables and words  40% success in 2 sessions Baseline: Goal status: Revised  2.  Pt will demonstrate slowed speech rate when reading words and phrases, in 4 sessions Baseline:  Goal status: Ongoing  3.  When experiencing multiple reps during reading words, pt will cease verbalization and take a "refresher/cleansing" breath, in 40% of opportunities  Baseline:  Goal status: Revised  4.  Pt will seek out psychological therapy/treatment dealing with her life changes post-fall, including changes in speech pattern Baseline:  Goal status: Ongoing  5.  Pt will tell SLP 2 new practical memory and attention compensations she could use, in 3 sessions Baseline:  Goal status: Ongoing  6.  Pt will complete PROM as listed above Baseline:  Goal status: Ongoing  LONG TERM GOALS: Target date:  12-03-21     Pt will demo easy onset of voicing with 2-3 syllable stimuli 60%  success in 2 sessions Baseline:  Goal status: Ongoing  2.  Pt will demonstrate slowed speech rate when reading 5-6 word sentences, in 3 sessions Baseline:  Goal status: Ongoing  3.  When experiencing multiple reps, pt will cease verbalization and use a "reset" technique, in 70% of opportunities Baseline:  Goal status: Ongoing  4.  Pt will tell SLP about or demo successful use of memory compensations in 3 sessions Baseline:  Goal status: Ongoing  5.  Pt will tell SLP about or demo successful use of attention compensations in 3 sessions Baseline:  Goal status: Ongoing  6.  Pt will score higher QOL on PROMs than on initial administration Baseline:  Goal status: Ongoing  ASSESSMENT:  CLINICAL IMPRESSION: Patient is a 66 y.o. female who was seen today for therapy for speech/fluency changes since two weeks after a fall in April as well as compensations for attention and memory. At this time SLP believes pt's dysfluency has a neurogenic component, but is mostly psychogenic. It is this SLP's opinion that progress with dysfluency will be limited without initial psychological therapy. Pt exhibited frequent tearfulness today and this along with her level of dysfluency limits what SLP could do during treatment. Pt again stated difficulty completing a QOL questionnaire of ~ 20 questions because it was too emotionally difficult. Pt's deficits are significantly negatively impacting her verbal communication - she is able to adequately write out messages but this is very inefficient and impossible while using the telephone. SEE TX NOTE. She is currently undergoing neuropsychiatric testing at [facility] in [place]. SLP is attempting to obtain those records in order to have more of an idea about pt's feasibility to cont skilled ST without psych assistance.   OBJECTIVE IMPAIRMENTS include attention, memory, expressive language, and dysfluency . These impairments are limiting patient  from return to work, managing medications, managing appointments, managing finances, household responsibilities, ADLs/IADLs, and effectively communicating at home and in community. Factors affecting potential to achieve goals and functional outcome are ability to learn/carryover information, cooperation/participation level, severity of impairments, and anxiety level, and low frustration tolerance . Patient will benefit from skilled SLP services to address above impairments and improve overall function.  REHAB POTENTIAL: Fair given psychological component to pt's disorder exacerbating her dysfluency sx.  PLAN: SLP FREQUENCY: 2x/week  SLP DURATION: 8 weeks  PLANNED INTERVENTIONS: Environmental controls, Cueing hierachy, Cognitive reorganization, Internal/external aids, Oral motor exercises, Functional tasks, Multimodal communication approach, SLP instruction and feedback, Compensatory strategies, and Patient/family education    [facility], [place] 10/28/2021, 4:24 PM

## 2021-11-02 ENCOUNTER — Ambulatory Visit: Payer: No Typology Code available for payment source

## 2021-11-02 DIAGNOSIS — R4789 Other speech disturbances: Secondary | ICD-10-CM | POA: Diagnosis not present

## 2021-11-02 DIAGNOSIS — R41841 Cognitive communication deficit: Secondary | ICD-10-CM

## 2021-11-02 DIAGNOSIS — F985 Adult onset fluency disorder: Secondary | ICD-10-CM

## 2021-11-02 NOTE — Therapy (Signed)
OUTPATIENT SPEECH LANGUAGE PATHOLOGY TREATMENT SESSION   Patient Name: Andrea Blackwell MRN: [id number] DOB:[birth year], 66 y.o., female Today's Date: 11/02/2021  PCP: [name], NP REFERRING PROVIDER: Arnoldo Lenis, PA-C ([place])   End of Session - 11/02/21 1704     Visit Number 6    Number of Visits 17    Date for SLP Re-Evaluation 12/03/21    SLP Start Time 1619   SLP Stop Time  1656   SLP Time Calculation (min)  37 min    Activity Tolerance Other (comment)   limited due to lability/tearfulness, and severity of deficit (need for psych treatment)             Past Medical History:  Diagnosis Date   Allergy    Anxiety    Cataract    Depression    Diabetes mellitus without complication ([place])    GERD (gastroesophageal reflux disease)    Hypothyroidism    Pancreatitis    Past Surgical History:  Procedure Laterality Date   CHOLECYSTECTOMY     ENDOSCOPIC RETROGRADE CHOLANGIOPANCREATOGRAPHY (ERCP) WITH PROPOFOL N/A 02/02/2017   Procedure: ENDOSCOPIC RETROGRADE CHOLANGIOPANCREATOGRAPHY (ERCP) WITH PROPOFOL;  Surgeon: Milus Banister, MD;  Location: [facility];  Service: Endoscopy;  Laterality: N/A;   ERCP N/A 12/10/2016   Procedure: ENDOSCOPIC RETROGRADE CHOLANGIOPANCREATOGRAPHY (ERCP);  Surgeon: Carol Ada, MD;  Location: [facility];  Service: Endoscopy;  Laterality: N/A;   ERCP N/A 12/14/2016   Procedure: ENDOSCOPIC RETROGRADE CHOLANGIOPANCREATOGRAPHY (ERCP);  Surgeon: Milus Banister, MD;  Location: [facility];  Service: Endoscopy;  Laterality: N/A;   LAPAROSCOPIC CHOLECYSTECTOMY SINGLE SITE WITH INTRAOPERATIVE CHOLANGIOGRAM N/A 12/13/2016   Procedure: LAPAROSCOPIC CHOLECYSTECTOMY SINGLE SITE AND LIVER BIOPSY;  Surgeon: Michael Boston, MD;  Location: [facility];  Service: General;  Laterality: N/A;   OVARY SURGERY     Patient Active Problem List   Diagnosis Date Noted   Encounter for removal of biliary stent    Anxiety and depression  02/01/2017   Bile duct leak    Acute calculous cholecystitis 12/13/2016   Jaundice 12/13/2016   Obesity 12/13/2016   Hypothyroidism    Gallstone pancreatitis 12/10/2016    ONSET DATE: 05/25/21   REFERRING DIAG: V03.5K0X (ICD-10-CM) - Concussion without loss of consciousness, initial encounter 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  Pt accompanied by: self  PERTINENT HISTORY: Andrea Blackwell is a 66 y.o. with premorbid PTSD, anxiety seen in the [facility] emergency department on 05/25/21 after mechanical fall and head injury, had a CT scan at that time of the brain and cervical spine which did not show acute injuries, was diagnosed with a concussion at that time. Second ED visit to [facility] ED 06/28/21 at which time she reported that ever since then she has had severe persistent daily headaches, photophobia, there is also had pain and difficulty with her speech and thinking, reports "every time I try to think I can't do it," reports that she has difficulty forming words, she repeats herself, this has severely impacted her ability to work.  She does not have any family in the area reports she has a church group that checks in on her.  She reports  she has seen an orthopedic doctor as well as a concussion specialist, and were told that the symptoms can be expected from a concussion and that she needs to see a neurologist.  Pt is currently undergoing neuropsychiatric testing in [facility]. SLP to attempt to obtain those records.   LIVING ENVIRONMENT: Lives with: lives alone with daily check-ins from church  friends Lives in: House/apartment  PLOF:  Level of assistance: Independent with ADLs Employment: Part-time employment   PATIENT GOALS  "Get back to being me again."  OBJECTIVE:   DIAGNOSTIC FINDINGS:  MRI Brain Without Contrast IMPRESSION: No acute intracranial process. No hemosiderin deposition to suggest remote hemorrhage or diffuse axonal injury.   COGNITION: (10/07/21) Overall cognitive status: Difficulty to assess due to: emotional lability and dysfluency. Areas of impairment:  Attention: Impaired: Comment: TBD after more testing Memory: Impaired: Short term Prospective TBD after more testing Behavior: Verbal agitation, Poor frustration tolerance, and Lability  Functional deficits:  Jahzara reports difficulty with recall and concentration. Decr'd ability with all areas was seen today to exacerbate her dysfluency which lead to more frustration and then worse dysfluency, and so on. Eventually this lead to tears or the patient stopping verbalization and breathing and a second attempt at verbalization.    PATIENT REPORTED OUTCOME MEASURES (PROM): Communication Participation Item Bank:   and Communication Effectiveness Survey: were provided and pt cont to have difficulty returning them due to her feeling she is unable to emotionally deal with the results. SLP will cont to encourage pt to complete these measures.   TODAY'S TREATMENT:  11/02/21: SLP noted pt with decr'd frequency and severity (no blocks present, just 3-6 reps) of dysfluency in lobby when speaking with receptionist prior to [place]. As SLP brought pt back to [place] room, pt engaged in conversation with SLP with same outcome. Within 5 minutes of being in [place] room and discussing dysfluency pt's frequency and severity incr'd to same levels as previous session - conversation then led to tearfulness. After 10 minutes, pt then shared about MD appointment yesterday and how she is skeptical she will be able to return to work. When this  occurred, pt's severity and frequency of dysfluency decr'd again to level commensurate with prior to ST in the lobby. SLP discussed decr to once/week until pt can initiate psychological services and pt agreed this was best at this time.  Eight times during session today pt stopped and used in-block modification (stop, breathe, relaxed articulation) with success starting more fluent production 5/8 times.  9/21//23: Pt arrived and put tissues on therapy table. SLP inquired if Maryssa had talked with her case manager (NCM) about psychological/psychiatric therapy services. She had called her case manager and reported her NCM was waiting from a return call from [facility]. "I'm trying not to talk as much because it stresses me out more." (Became tearful). After SLP assisted pt in calming down, she stated she brought pictures of her medication bottles today to show SLP. Therefore, SLP discussed pt's timing of her medication with her today. Maisley endorsed possible (she was not completely sure) skips in her medication in the past 2 weeks. SLP put a grid in front of her with columns "Rx, AM, PM, bed" in order to organize her med administration times, and stated (severe dysfluency) "I can't do this," and became tearful. After pt calmed down SLP asked Elspeth how she could organize her meds so that she does not have skips and she suggested a medication box. SLP affirmed that thought  and encouraged pt to purchase one. Pt noted to write down 8pm for meds that, on med bottle, said to take in AM. Pt noticed this and changed the administration time column on her grid.  Homework is to buy med box and to write down administration time after she checks the bottle she took a picture of that said "see label instructions" and she did not take a picture of the label. SLP got number of Amany's NCM to contact to inquire of [facility] ability to see pt for therapy services following her evaluation there.  10/21/21: SLP worked with  pt on compensations for dysfluency. Pt limited/no success, which brought on tearful response/s. SLP explained to pt that SLP thinks that pt requires psychological counseling prior to any more skilled ST. SLP inquired if pt had completed PROM but she had not due to inability to fill out entire form due to high emotions. SLP encouraged pt to fill this out and return it.  Pt called her nurse case manager and she had already called her re: psych services. Case manager going to check what might be possible.   10/19/21: Pt did not return PROM due to it was reportedly difficult to complete emotionally - "Everything was on this side (pt waving hand to right - decr'd QOL responses are on the right side). SLP encouraged pt to complete 5-6 questions at one time until completed and then bring back to SLP. SLP educated pt on rationale for PROM. Pt tearful x11 today. Pt visibly internally tense and anxious so SLP asked pt play music that would calm her down internally and to close her eyes to aid in relaxation. She was tearful 60 seconds into task saying "I've never done this before." SLP then suggested playing a natural sound that would assist her internal calm/peace and she chose thunderstorm. SLP guided pt through progressive relaxation for approx 8 minutes and then asked pt to keep lips closed and vocalize humming. Pt was dysfluent on the hum, and then began crying. SLP asked pt where her tension was and she indicated her entire face and oral and pharyngeal space. Pt indicated she was not tense and tight anywhere prior to being asked to hum but stated (with dysfluency and tears) "I didn't think I could do it right and then I felt tense." SLP told pt that this difficulty she is having is both psychological and neurological, but that the psychological is greatly increasing the severity of the difficulty (in this SLP's opinion), and that she must  receive psychological therapy/assistance before we will see much progress with  her dysfluency.   10/12/21: SLP engaged pt in conversation stressing relaxed speech, and a relaxed environment. Pt demonstrated pseudobulbar crying/emotional lability x4 during today's session. In two instances, pt stated she was unaware why she was crying. Pt had approx 5 minutes total throughout session when she had 3-4 reps of even syllable or word reps once-twice an utterance, without secondary behaviors. Other than this, pt demonstrated multiple reps (sometimes 10+) of both even and uneven phoneme, partial syllable, syllable, word, or phrase reps with secondary behaviors. SLP explained to pt that it appears that pt has mild dysfluency post fall, but her level of anxiety and stress exacerbate her dysfluency to debilitating levels. SLP gave pt homework for checking with RN case manager about psychologist/counselor for pt to discuss changes post fall, and to take at least 5 times during the day when she can take a brain break and "decompress" with music, or  scripture, or music and scripture in order to decr her anxiety and/or frustration - whatever feelings are impeding her ability to communicate more effectively. SLP provided PROM and pt to return next session.   10/07/21: SLP explained results of SLP assessment. SLP shared that assistance could be provided in reducing pt's severity of dysfluency by using some fluency enhancing strategies and in-block modification, but that SLP believes that there is definitely a psychological component to her disorder which would require psychological assistance.   PATIENT EDUCATION: Education details: as above in today's treatment Person educated: Patient Education method: Explanation and Demonstration Education comprehension: verbalized understanding, returned demonstration, and needs further education     GOALS: Goals reviewed with patient? Yes, in general  SHORT TERM GOALS: Target date: 11/04/2021    Pt will demo easy onset of voicing with vowel+consonant  syllables and words 40% success in 2 sessions Baseline: Goal status: Revised  2.  Pt will demonstrate slowed speech rate when reading words and phrases, in 4 sessions Baseline:  Goal status: Ongoing  3.  When experiencing multiple reps during reading words, pt will cease verbalization and take a "refresher/cleansing" breath, in 25% of opportunities  Baseline:  Goal status: Revised  4.  Pt will seek out psychological therapy/treatment dealing with her life changes post-fall, including changes in speech pattern Baseline:  Goal status: Met  5.  Pt will tell SLP 2 new practical memory and attention compensations she could use, in 3 sessions Baseline:  Goal status: Ongoing  6.  Pt will complete PROM as listed above Baseline:  Goal status: Ongoing  LONG TERM GOALS: Target date:  12-03-21     Pt will demo easy onset of voicing with 2-3 syllable stimuli 60% success in 2 sessions Baseline:  Goal status: Ongoing  2.  Pt will demonstrate slowed speech rate when reading 5-6 word sentences, in 3 sessions Baseline:  Goal status: Ongoing  3.  When experiencing multiple reps, pt will cease verbalization and use a "reset" technique, in 70% of opportunities Baseline:  Goal status: Ongoing  4.  Pt will tell SLP about or demo successful use of memory compensations in 3 sessions Baseline:  Goal status: Ongoing  5.  Pt will tell SLP about or demo successful use of attention compensations in 3 sessions Baseline:  Goal status: Ongoing  6.  Pt will score higher QOL on PROMs than on initial administration Baseline:  Goal status: Ongoing  ASSESSMENT:  CLINICAL IMPRESSION: Patient is a 66 y.o. female who was seen today for therapy for speech/fluency changes since two weeks after a fall in April as well as compensations for attention and memory. SLP cont to believe pt's dysfluency is primarily psychogenic with a small neurogenic component, if at all. It is this SLP's opinion that progress  with dysfluency will be limited without initial psychological therapy. Pt's deficits are significantly negatively impacting her verbal communication - she is able to adequately write out messages but this is very inefficient and impossible while using the telephone. SEE TX NOTE. She is currently undergoing neuropsychiatric testing at [facility] in [place]. SLP is attempting to obtain those records in order to have more of an idea about pt's feasibility to cont skilled ST without psych assistance.   OBJECTIVE IMPAIRMENTS include attention, memory, expressive language, and dysfluency . These impairments are limiting patient from return to work, managing medications, managing appointments, managing finances, household responsibilities, ADLs/IADLs, and effectively communicating at home and in community. Factors affecting potential to achieve  goals and functional outcome are ability to learn/carryover information, cooperation/participation level, severity of impairments, and anxiety level, and low frustration tolerance . Patient will benefit from skilled SLP services to address above impairments and improve overall function.  REHAB POTENTIAL: Fair given psychological component to pt's disorder exacerbating her dysfluency sx.  PLAN: SLP FREQUENCY: 2x/week  SLP DURATION: 8 weeks  PLANNED INTERVENTIONS: Environmental controls, Cueing hierachy, Cognitive reorganization, Internal/external aids, Oral motor exercises, Functional tasks, Multimodal communication approach, SLP instruction and feedback, Compensatory strategies, and Patient/family education    [facility], [place] 11/02/2021, 4:17 PM

## 2021-11-03 ENCOUNTER — Ambulatory Visit: Payer: Self-pay | Admitting: Physical Medicine & Rehabilitation

## 2021-11-05 ENCOUNTER — Ambulatory Visit: Payer: No Typology Code available for payment source

## 2021-11-09 ENCOUNTER — Ambulatory Visit: Payer: No Typology Code available for payment source

## 2021-11-18 ENCOUNTER — Ambulatory Visit: Payer: No Typology Code available for payment source | Attending: Physician Assistant

## 2021-11-18 DIAGNOSIS — R41841 Cognitive communication deficit: Secondary | ICD-10-CM | POA: Insufficient documentation

## 2021-11-18 DIAGNOSIS — F985 Adult onset fluency disorder: Secondary | ICD-10-CM | POA: Insufficient documentation

## 2021-11-18 DIAGNOSIS — R4789 Other speech disturbances: Secondary | ICD-10-CM | POA: Diagnosis present

## 2021-11-18 NOTE — Therapy (Signed)
OUTPATIENT SPEECH LANGUAGE PATHOLOGY TREATMENT SESSION   Patient Name: Andrea Blackwell MRN: [id number] DOB:1955/06/12, 66 y.o., female Today's Date: 11/22/2021  PCP: [name], NP REFERRING PROVIDER: Arnoldo Lenis, PA-C ([place])   End of Session - 11/02/21 1704     Visit Number 7    Number of Visits 17    Date for SLP Re-Evaluation 12/03/21    SLP Start Time 1621   SLP Stop Time  8502   SLP Time Calculation (min)  28 min    Activity Tolerance Other (comment)   limited due to lability/tearfulness and anxiety, and severity of deficit (need for psych treatment)             Past Medical History:  Diagnosis Date   Allergy    Anxiety    Cataract    Depression    Diabetes mellitus without complication ([place])    GERD (gastroesophageal reflux disease)    Hypothyroidism    Pancreatitis    Past Surgical History:  Procedure Laterality Date   CHOLECYSTECTOMY     ENDOSCOPIC RETROGRADE CHOLANGIOPANCREATOGRAPHY (ERCP) WITH PROPOFOL N/A 02/02/2017   Procedure: ENDOSCOPIC RETROGRADE CHOLANGIOPANCREATOGRAPHY (ERCP) WITH PROPOFOL;  Surgeon: Milus Banister, MD;  Location: [facility];  Service: Endoscopy;  Laterality: N/A;   ERCP N/A 12/10/2016   Procedure: ENDOSCOPIC RETROGRADE CHOLANGIOPANCREATOGRAPHY (ERCP);  Surgeon: Carol Ada, MD;  Location: [facility];  Service: Endoscopy;  Laterality: N/A;   ERCP N/A 12/14/2016   Procedure: ENDOSCOPIC RETROGRADE CHOLANGIOPANCREATOGRAPHY (ERCP);  Surgeon: Milus Banister, MD;  Location: [facility];  Service: Endoscopy;  Laterality: N/A;   LAPAROSCOPIC CHOLECYSTECTOMY SINGLE SITE WITH INTRAOPERATIVE CHOLANGIOGRAM N/A 12/13/2016   Procedure: LAPAROSCOPIC CHOLECYSTECTOMY SINGLE SITE AND LIVER BIOPSY;  Surgeon: Michael Boston, MD;  Location: [facility];  Service: General;  Laterality: N/A;   OVARY SURGERY     Patient Active Problem List   Diagnosis Date Noted   Encounter for removal of biliary stent    Anxiety and  depression 02/01/2017   Bile duct leak    Acute calculous cholecystitis 12/13/2016   Jaundice 12/13/2016   Obesity 12/13/2016   Hypothyroidism    Gallstone pancreatitis 12/10/2016    ONSET DATE: 05/25/21   REFERRING DIAG: D74.1O8N (ICD-10-CM) - Concussion without loss of consciousness, initial encounter 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 I think it's side effects of the medicine but I don't want to call the doctor. I want to wait." Pt accompanied by: self  PERTINENT HISTORY: Andrea Blackwell is a 66 y.o. with premorbid PTSD, anxiety seen in the [facility] emergency department on 05/25/21 after mechanical fall and head injury, had a CT scan at that time of the brain and cervical spine which did not show acute injuries, was diagnosed with a concussion at that time. Second ED visit to [facility] ED 06/28/21 at which time she reported that ever since then she has had severe persistent daily headaches, photophobia, there is also had pain and difficulty with her speech and thinking, reports "every time I try to think I can't do it," reports that she has difficulty forming words, she repeats herself, this has severely impacted her ability to work.  She does not have any family in the area reports  she has a church group that checks in on her.  She reports she has seen an orthopedic doctor as well as a concussion specialist, and were told that the symptoms can be expected from a concussion and that she needs to see a neurologist.  Pt is currently undergoing neuropsychiatric testing in [facility]. SLP to attempt to obtain those records.   LIVING ENVIRONMENT: Lives with: lives alone  with daily check-ins from church friends Lives in: House/apartment  PLOF:  Level of assistance: Independent with ADLs Employment: Part-time employment   PATIENT GOALS  "Get back to being me again."  OBJECTIVE:   DIAGNOSTIC FINDINGS:  MRI Brain Without Contrast IMPRESSION: No acute intracranial process. No hemosiderin deposition to suggest remote hemorrhage or diffuse axonal injury.   COGNITION: (10/07/21) Overall cognitive status: Difficulty to assess due to: emotional lability and dysfluency. Areas of impairment:  Attention: Impaired: Comment: TBD after more testing Memory: Impaired: Short term Prospective TBD after more testing Behavior: Verbal agitation, Poor frustration tolerance, and Lability  Functional deficits:  Andrea Blackwell reports difficulty with recall and concentration. Decr'd ability with all areas was seen today to exacerbate her dysfluency which lead to more frustration and then worse dysfluency, and so on. Eventually this lead to tears or the patient stopping verbalization and breathing and a second attempt at verbalization.    PATIENT REPORTED OUTCOME MEASURES (PROM): Communication Participation Item Bank:   and Communication Effectiveness Survey: were provided and pt cont to have difficulty returning them due to her feeling she is unable to emotionally deal with the results. SLP will cont to encourage pt to complete these measures.   TODAY'S TREATMENT:  11/18/21: Pt arrived with dysfluency comparable to sessions on 10/28/21 and 10/21/21. Pt tearful throughout session. Andrea Blackwell told SLP new meds Qulipta and Zoloft were prescribed 11/08/21 at neuropsychiatry appt and that she was a lot more anxious now than previously (see "S" statement). SLP spent majority of session attempting to calm pt by using steady abdominal breathing (AB). 10-12 reps/phoneme and long (4-6+ seconds) blocks were prevalent today in pt's conversational speech. After approx 30 minutes pt stated, "I just need to go,  just need to go, may I go now? The anxiety - I need to go." SLP excused pt based on her request.  11/02/21: SLP noted pt with decr'd frequency and severity (no blocks present, just 3-6 reps) of dysfluency in lobby when speaking with receptionist prior to [place]. As SLP brought pt back to [place] room, pt engaged in conversation with SLP with same outcome. Within 5 minutes of being in [place] room and discussing dysfluency pt's frequency and severity incr'd to same levels as previous session - conversation then led to tearfulness. After 10 minutes, pt then shared about MD appointment yesterday and how she is skeptical she will be able to return to work. When this occurred, pt's severity and frequency of dysfluency decr'd again to level commensurate with prior to ST in the lobby. SLP discussed decr to once/week until pt can initiate psychological services and pt agreed this was best at this time.  Eight times during session today pt stopped and used in-block modification (stop, breathe, relaxed articulation) with success starting more fluent production 5/8 times.  9/21//23: Pt arrived and put tissues on therapy table. SLP inquired if Andrea Blackwell had talked with her case manager (NCM) about psychological/psychiatric therapy services. She had called her case manager and reported her NCM was waiting from a return call from [facility]. "I'm trying not to talk as  much because it stresses me out more." (Became tearful). After SLP assisted pt in calming down, she stated she brought pictures of her medication bottles today to show SLP. Therefore, SLP discussed pt's timing of her medication with her today. Andrea Blackwell endorsed possible (she was not completely sure) skips in her medication in the past 2 weeks. SLP put a grid in front of her with columns "Rx, AM, PM, bed" in order to organize her med administration times, and stated (severe dysfluency) "I can't do this," and became tearful. After pt calmed down SLP asked Andrea Blackwell how she could  organize her meds so that she does not have skips and she suggested a medication box. SLP affirmed that thought and encouraged pt to purchase one. Pt noted to write down 8pm for meds that, on med bottle, said to take in AM. Pt noticed this and changed the administration time column on her grid.  Homework is to buy med box and to write down administration time after she checks the bottle she took a picture of that said "see label instructions" and she did not take a picture of the label. SLP got number of Andrea Blackwell's NCM to contact to inquire of [facility] ability to see pt for therapy services following her evaluation there.  10/21/21: SLP worked with pt on compensations for dysfluency. Pt limited/no success, which brought on tearful response/s. SLP explained to pt that SLP thinks that pt requires psychological counseling prior to any more skilled ST. SLP inquired if pt had completed PROM but she had not due to inability to fill out entire form due to high emotions. SLP encouraged pt to fill this out and return it.  Pt called her nurse case manager and she had already called her re: psych services. Case manager going to check what might be possible.   10/19/21: Pt did not return PROM due to it was reportedly difficult to complete emotionally - "Everything was on this side (pt waving hand to right - decr'd QOL responses are on the right side). SLP encouraged pt to complete 5-6 questions at one time until completed and then bring back to SLP. SLP educated pt on rationale for PROM. Pt tearful x11 today. Pt visibly internally tense and anxious so SLP asked pt play music that would calm her down internally and to close her eyes to aid in relaxation. She was tearful 60 seconds into task saying "I've never done this before." SLP then suggested playing a natural sound that would assist her internal calm/peace and she chose thunderstorm. SLP guided pt through progressive relaxation for approx 8 minutes and then asked  pt to keep lips closed and vocalize humming. Pt was dysfluent on the hum, and then began crying. SLP asked pt where her tension was and she indicated her entire face and oral and pharyngeal space. Pt indicated she was not tense and tight anywhere prior to being asked to hum but stated (with dysfluency and tears) "I didn't think I could do it right and then I felt tense." SLP told pt that this difficulty she is having is both psychological and neurological, but that the psychological is greatly increasing the severity of the difficulty (in this SLP's opinion), and that she must  receive psychological therapy/assistance before we will see much progress with her dysfluency.   10/12/21: SLP engaged pt in conversation stressing relaxed speech, and a relaxed environment. Pt demonstrated pseudobulbar crying/emotional lability x4 during today's session. In two instances, pt stated she was unaware why she  was crying. Pt had approx 5 minutes total throughout session when she had 3-4 reps of even syllable or word reps once-twice an utterance, without secondary behaviors. Other than this, pt demonstrated multiple reps (sometimes 10+) of both even and uneven phoneme, partial syllable, syllable, word, or phrase reps with secondary behaviors. SLP explained to pt that it appears that pt has mild dysfluency post fall, but her level of anxiety and stress exacerbate her dysfluency to debilitating levels. SLP gave pt homework for checking with RN case manager about psychologist/counselor for pt to discuss changes post fall, and to take at least 5 times during the day when she can take a brain break and "decompress" with music, or scripture, or music and scripture in order to decr her anxiety and/or frustration - whatever feelings are impeding her ability to communicate more effectively. SLP provided PROM and pt to return next session.   10/07/21: SLP explained results of SLP assessment. SLP shared that assistance could be provided in  reducing pt's severity of dysfluency by using some fluency enhancing strategies and in-block modification, but that SLP believes that there is definitely a psychological component to her disorder which would require psychological assistance.   PATIENT EDUCATION: Education details: as above in today's treatment Person educated: Patient Education method: Explanation and Demonstration Education comprehension: verbalized understanding, returned demonstration, and needs further education     GOALS: Goals reviewed with patient? Yes, in general  SHORT TERM GOALS: Target date: 11/04/2021    Pt will demo easy onset of voicing with vowel+consonant syllables and words 40% success in 2 sessions Baseline: Goal status: Revised  2.  Pt will demonstrate slowed speech rate when reading words and phrases, in 4 sessions Baseline:  Goal status: Not met  3.  When experiencing multiple reps during reading words, pt will cease verbalization and take a "refresher/cleansing" breath, in 25% of opportunities  Baseline:  Goal status: Not met  4.  Pt will seek out psychological therapy/treatment dealing with her life changes post-fall, including changes in speech pattern Baseline:  Goal status: Met  5.  Pt will tell SLP 2 new practical memory and attention compensations she could use, in 3 sessions Baseline:  Goal status: Not met  6.  Pt will complete PROM as listed above Baseline:  Goal status: Not met  LONG TERM GOALS: Target date:  12-03-21     Pt will demo easy onset of voicing with 2-3 syllable stimuli 60% success in 2 sessions Baseline:  Goal status: Ongoing  2.  Pt will demonstrate slowed speech rate when reading 5-6 word sentences, in 3 sessions Baseline:  Goal status: Ongoing  3.  When experiencing multiple reps, pt will cease verbalization and use a "reset" technique, in 70% of opportunities Baseline:  Goal status: Ongoing  4.  Pt will tell SLP about or demo successful use of  memory compensations in 3 sessions Baseline:  Goal status: Ongoing  5.  Pt will tell SLP about or demo successful use of attention compensations in 3 sessions Baseline:  Goal status: Ongoing  6.  Pt will score higher QOL on PROMs than on initial administration Baseline:  Goal status: Ongoing  ASSESSMENT:  CLINICAL IMPRESSION: Pt did not meet any STGs. Patient is a 66 y.o. female who was seen today for therapy for speech/fluency changes since two weeks after a fall in April as well as compensations for attention and memory. SLP cont to believe pt's dysfluency is primarily psychogenic with a small neurogenic component, if at  all. It is this SLP's opinion that progress with dysfluency will be limited without initial psychological therapy. Pt's deficits are significantly negatively impacting her verbal communication - she is able to adequately write out messages but this is very inefficient and impossible while using the telephone. SEE TX NOTE. She is currently undergoing neuropsychiatric testing at [facility] in [place]. SLP is attempting to obtain those records in order to have more of an idea about pt's feasibility to cont skilled ST without psych assistance.   OBJECTIVE IMPAIRMENTS include attention, memory, expressive language, and dysfluency . These impairments are limiting patient from return to work, managing medications, managing appointments, managing finances, household responsibilities, ADLs/IADLs, and effectively communicating at home and in community. Factors affecting potential to achieve goals and functional outcome are ability to learn/carryover information, cooperation/participation level, severity of impairments, and anxiety level, and low frustration tolerance . Patient will benefit from skilled SLP services to address above impairments and improve overall function.  REHAB POTENTIAL: Fair given psychological component to pt's disorder exacerbating her  dysfluency sx.  PLAN: SLP FREQUENCY: 2x/week  SLP DURATION: 8 weeks  PLANNED INTERVENTIONS: Environmental controls, Cueing hierachy, Cognitive reorganization, Internal/external aids, Oral motor exercises, Functional tasks, Multimodal communication approach, SLP instruction and feedback, Compensatory strategies, and Patient/family education    [facility] [address], [place] 11/22/2021, 8:00 AM

## 2021-11-25 ENCOUNTER — Ambulatory Visit: Payer: No Typology Code available for payment source

## 2021-12-02 ENCOUNTER — Ambulatory Visit: Payer: Self-pay

## 2022-03-12 ENCOUNTER — Emergency Department (HOSPITAL_COMMUNITY)
Admission: EM | Admit: 2022-03-12 | Discharge: 2022-03-12 | Disposition: A | Discharge status: Home / Self Care | Payer: Medicaid Other | Attending: Emergency Medicine | Admitting: Emergency Medicine

## 2022-03-12 ENCOUNTER — Emergency Department (HOSPITAL_COMMUNITY): Payer: Medicaid Other

## 2022-03-12 ENCOUNTER — Encounter (HOSPITAL_COMMUNITY): Payer: Self-pay | Admitting: Emergency Medicine

## 2022-03-12 ENCOUNTER — Other Ambulatory Visit: Payer: Self-pay

## 2022-03-12 DIAGNOSIS — T07XXXA Unspecified multiple injuries, initial encounter: Secondary | ICD-10-CM

## 2022-03-12 DIAGNOSIS — W19XXXA Unspecified fall, initial encounter: Secondary | ICD-10-CM

## 2022-03-12 DIAGNOSIS — E119 Type 2 diabetes mellitus without complications: Secondary | ICD-10-CM | POA: Diagnosis not present

## 2022-03-12 DIAGNOSIS — W01198A Fall on same level from slipping, tripping and stumbling with subsequent striking against other object, initial encounter: Secondary | ICD-10-CM | POA: Diagnosis not present

## 2022-03-12 DIAGNOSIS — S60511A Abrasion of right hand, initial encounter: Secondary | ICD-10-CM | POA: Diagnosis not present

## 2022-03-12 DIAGNOSIS — Z79890 Hormone replacement therapy (postmenopausal): Secondary | ICD-10-CM | POA: Diagnosis not present

## 2022-03-12 DIAGNOSIS — S80811A Abrasion, right lower leg, initial encounter: Secondary | ICD-10-CM | POA: Diagnosis not present

## 2022-03-12 DIAGNOSIS — E039 Hypothyroidism, unspecified: Secondary | ICD-10-CM | POA: Diagnosis not present

## 2022-03-12 DIAGNOSIS — S0081XA Abrasion of other part of head, initial encounter: Secondary | ICD-10-CM | POA: Diagnosis not present

## 2022-03-12 DIAGNOSIS — S6991XA Unspecified injury of right wrist, hand and finger(s), initial encounter: Secondary | ICD-10-CM | POA: Diagnosis present

## 2022-03-12 LAB — CBC WITH DIFFERENTIAL/PLATELET
Abs Immature Granulocytes: 0.01 10*3/uL (ref 0.00–0.07)
Basophils Absolute: 0 10*3/uL (ref 0.0–0.1)
Basophils Relative: 0 %
Eosinophils Absolute: 0.1 10*3/uL (ref 0.0–0.5)
Eosinophils Relative: 2 %
HCT: 38.9 % (ref 36.0–46.0)
Hemoglobin: 13.3 g/dL (ref 12.0–15.0)
Immature Granulocytes: 0 %
Lymphocytes Relative: 33 %
Lymphs Abs: 1.7 10*3/uL (ref 0.7–4.0)
MCH: 31.1 pg (ref 26.0–34.0)
MCHC: 34.2 g/dL (ref 30.0–36.0)
MCV: 91.1 fL (ref 80.0–100.0)
Monocytes Absolute: 0.3 10*3/uL (ref 0.1–1.0)
Monocytes Relative: 5 %
Neutro Abs: 3 10*3/uL (ref 1.7–7.7)
Neutrophils Relative %: 60 %
Platelets: 213 10*3/uL (ref 150–400)
RBC: 4.27 MIL/uL (ref 3.87–5.11)
RDW: 12.3 % (ref 11.5–15.5)
WBC: 5.1 10*3/uL (ref 4.0–10.5)
nRBC: 0 % (ref 0.0–0.2)

## 2022-03-12 LAB — BASIC METABOLIC PANEL
Anion gap: 11 (ref 5–15)
BUN: 18 mg/dL (ref 8–23)
CO2: 25 mmol/L (ref 22–32)
Calcium: 9.5 mg/dL (ref 8.9–10.3)
Chloride: 99 mmol/L (ref 98–111)
Creatinine, Ser: 0.72 mg/dL (ref 0.44–1.00)
GFR, Estimated: >60 mL/min (ref >60)
Glucose, Bld: 112 mg/dL — ABNORMAL HIGH (ref 70–99)
Potassium: 4 mmol/L (ref 3.5–5.1)
Sodium: 135 mmol/L (ref 135–145)

## 2022-03-12 MED ORDER — IBUPROFEN 600 MG PO TABS: 600.0000 mg | ORAL_TABLET | Freq: Four times a day (QID) | ORAL | 0 refills | Status: DC | PRN

## 2022-03-12 MED ORDER — MORPHINE SULFATE (PF) 4 MG/ML IV SOLN
4.0000 mg | Freq: Once | INTRAVENOUS | Status: AC
  Administered 2022-03-12: 4 mg via INTRAVENOUS
  Filled 2022-03-12: qty 1

## 2022-03-12 MED ORDER — ONDANSETRON HCL 4 MG/2ML IJ SOLN
4.0000 mg | Freq: Once | INTRAMUSCULAR | Status: AC
  Administered 2022-03-12: 4 mg via INTRAVENOUS
  Filled 2022-03-12: qty 2

## 2022-03-12 MED ORDER — HYDROCODONE-ACETAMINOPHEN 5-325 MG PO TABS: 1.0000 | ORAL_TABLET | Freq: Four times a day (QID) | ORAL | 0 refills | Status: DC | PRN

## 2022-03-12 NOTE — ED Provider Notes (Signed)
[facility] EMERGENCY DEPARTMENT AT [facility] Provider Note   CSN: [id number] Arrival date & time: 03/12/22  2111     History  No chief complaint on file.   Andrea Blackwell is a 68 y.o. female.  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  Pt's friend was helping her jump her car and pt tripped on a curb.  She did not have a loc, but did hit her head and hurt her right hand, right leg, and left ankle.  She is not on blood thinners.         Home Medications Prior to Admission medications   Medication Sig Start Date End Date Taking? Authorizing Provider  ALPRAZolam Duanne Moron) 0.5 MG tablet Take 0.5 mg by mouth at bedtime as needed for anxiety.   Yes [provider]  Atogepant (QULIPTA) 60 MG TABS Take 1 tablet by mouth daily.   Yes [provider]  HYDROcodone-acetaminophen (NORCO/VICODIN) 5-325 MG tablet Take 1 tablet by mouth every 6 (six) hours as needed for up to 10 doses for severe pain. 03/12/22  Yes Elgie Congo, MD  ibuprofen (ADVIL) 600 MG tablet Take 1 tablet (600 mg total) by mouth every 6 (six) hours as needed. 03/12/22  Yes Isla Pence, MD  levothyroxine (SYNTHROID) 100 MCG tablet Take 100 mcg by mouth daily before breakfast.   Yes [provider]  Multiple Vitamin (MULTIVITAMIN WITH MINERALS) TABS tablet Take 1 tablet by mouth daily.   Yes [provider]  naltrexone (DEPADE) 50 MG tablet Take 25 mg by mouth at bedtime.   Yes [provider]  propranolol ER (INDERAL LA) 60 MG 24 hr capsule Take 60 mg by mouth daily. 01/03/22  Yes [provider]  ciprofloxacin (CIPRO) 500 MG tablet Take 1 tablet (500 mg total) by mouth 2 (two) times daily. Patient not taking: Reported on 03/12/2022 08/11/19   Raylene Everts, MD  escitalopram (LEXAPRO) 20 MG tablet Take 1 tablet (20 mg total) by mouth at bedtime.  08/01/18 10/30/18  Gildardo Pounds, NP  fluticasone (FLONASE) 50 MCG/ACT nasal spray PLACE 2 SPRAYS INTO BOTH NOSTRILS DAILY. Patient not taking: Reported on 03/12/2022 02/07/19   Gildardo Pounds, NP  levothyroxine (SYNTHROID) 150 MCG tablet Take 1 tablet (150 mcg total) by mouth daily before breakfast. Please mail Patient not taking: Reported on 03/12/2022 08/01/18   Gildardo Pounds, NP  omega-3 acid ethyl esters (LOVAZA) 1 g capsule Take 2 capsules (2 g total) by mouth 2 (two) times daily. 11/21/18 12/21/18  Gildardo Pounds, NP  PROPRANOLOL HCL ER PO Take 60 mg by mouth daily. Patient not taking: Reported on 03/12/2022    [provider]  risperiDONE (RISPERDAL) 0.5 MG tablet Take 0.5 mg by mouth 2 (two) times daily. Patient not taking: Reported on 03/12/2022 02/25/22   [provider]  sertraline (ZOLOFT) 100 MG tablet Take 100 mg by mouth daily. Patient not taking: Reported on 03/12/2022    Arnoldo Lenis, PA-C      Allergies    Penicillins    Review of Systems   Review of Systems  Musculoskeletal:        Right lower leg, left ankle, right hand  All other systems reviewed and are negative.   Physical Exam Updated Vital Signs BP (!) 176/71 (BP Location: Right Arm)   Pulse 69   Temp 97.9 F (36.6  C) (Oral)   Resp (!) 24   Wt 98 kg   SpO2 100%   BMI 33.84 kg/m  Physical Exam Vitals and nursing note reviewed.  Constitutional:      Appearance: Normal appearance.  HENT:     Head: Normocephalic.      Comments: abrasions to forehead    Right Ear: External ear normal.     Left Ear: External ear normal.     Mouth/Throat:     Mouth: Mucous membranes are dry.     Pharynx: Oropharynx is clear.  Eyes:     Extraocular Movements: Extraocular movements intact.     Conjunctiva/sclera: Conjunctivae normal.     Pupils: Pupils are equal, round, and reactive to light.  Cardiovascular:     Rate and Rhythm: Normal rate and regular rhythm.     Pulses: Normal pulses.      Heart sounds: Normal heart sounds.  Pulmonary:     Effort: Pulmonary effort is normal.     Breath sounds: Normal breath sounds.  Abdominal:     General: Abdomen is flat. Bowel sounds are normal.     Palpations: Abdomen is soft.  Musculoskeletal:     Cervical back: Normal range of motion and neck supple.     Comments: Abrasions to right hand, right lower leg  Skin:    General: Skin is warm.     Capillary Refill: Capillary refill takes less than 2 seconds.  Neurological:     Mental Status: She is alert and oriented to person, place, and time. Mental status is at baseline.     Comments: Speech difficulty (chronic)  Psychiatric:        Mood and Affect: Mood normal.        Behavior: Behavior normal.     ED Results / Procedures / Treatments   Labs (all labs ordered are listed, but only abnormal results are displayed) Labs Reviewed  CBC WITH DIFFERENTIAL/PLATELET  BASIC METABOLIC PANEL    EKG None  Radiology CT Head Wo Contrast  Result Date: 03/12/2022 CLINICAL DATA:  Fall, injury to forehead. EXAM: CT HEAD WITHOUT CONTRAST CT CERVICAL SPINE WITHOUT CONTRAST TECHNIQUE: Multidetector CT imaging of the head and cervical spine was performed following the standard protocol without intravenous contrast. Multiplanar CT image reconstructions of the cervical spine were also generated. RADIATION DOSE REDUCTION: This exam was performed according to the departmental dose-optimization program which includes automated exposure control, adjustment of the mA and/or kV according to patient size and/or use of iterative reconstruction technique. COMPARISON:  CT examination dated May 25, 2021 FINDINGS: CT HEAD FINDINGS Brain: No evidence of acute infarction, hemorrhage, hydrocephalus, extra-axial collection or mass lesion/mass effect. Vascular: No hyperdense vessel or unexpected calcification. Skull: Normal. Negative for fracture or focal lesion. Sinuses/Orbits: No acute finding. Other: None. CT CERVICAL  SPINE FINDINGS Alignment: Straightening of the cervical spine Skull base and vertebrae: No acute fracture. No primary bone lesion or focal pathologic process. Soft tissues and spinal canal: No prevertebral fluid or swelling. No visible canal hematoma. Disc levels: Mild multilevel degenerate disc disease with disc height loss. Facet joint arthropathy with mild right neural foraminal stenosis at C4-C5. Upper chest: Negative. Other: None IMPRESSION: CT HEAD: No acute intracranial abnormality. CT CERVICAL SPINE: 1. No acute fracture or traumatic subluxation. 2. 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 Facet joint arthropathy with mild right neural foraminal stenosis at C4-C5. Electronically Signed   By: Keane Police D.O.   On: 03/12/2022 22:36  CT Cervical Spine Wo Contrast  Result Date: 03/12/2022 CLINICAL DATA:  Fall, injury to forehead. EXAM: CT HEAD WITHOUT CONTRAST CT CERVICAL SPINE WITHOUT CONTRAST TECHNIQUE: Multidetector CT imaging of the head and cervical spine was performed following the standard protocol without intravenous contrast. Multiplanar CT image reconstructions of the cervical spine were also generated. RADIATION DOSE REDUCTION: This exam was performed according to the departmental dose-optimization program which includes automated exposure control, adjustment of the mA and/or kV according to patient size and/or use of iterative reconstruction technique. COMPARISON:  CT examination dated May 25, 2021 FINDINGS: CT HEAD FINDINGS Brain: No evidence of acute infarction, hemorrhage, hydrocephalus, extra-axial collection or mass lesion/mass effect. Vascular: No hyperdense vessel or unexpected calcification. Skull: Normal. Negative for fracture or focal lesion. Sinuses/Orbits: No acute finding. Other: None. CT CERVICAL SPINE FINDINGS Alignment: Straightening of the cervical spine Skull base and vertebrae: No acute fracture. No primary bone lesion or focal pathologic process. Soft  tissues and spinal canal: No prevertebral fluid or swelling. No visible canal hematoma. Disc levels: Mild multilevel degenerate disc disease with disc height loss. Facet joint arthropathy with mild right neural foraminal stenosis at C4-C5. Upper chest: Negative. Other: None IMPRESSION: CT HEAD: No acute intracranial abnormality. CT CERVICAL SPINE: 1. No acute fracture or traumatic subluxation. 2. 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 Facet joint arthropathy with mild right neural foraminal stenosis at C4-C5. Electronically Signed   By: Keane Police D.O.   On: 03/12/2022 22:36   DG Tibia/Fibula Right  Result Date: 03/12/2022 CLINICAL DATA:  Fall EXAM: RIGHT TIBIA AND FIBULA - 2 VIEW COMPARISON:  None Available. FINDINGS: There is no evidence of fracture or other focal bone lesions. Soft tissues are unremarkable. IMPRESSION: Negative. Electronically Signed   By: Rolm Baptise M.D.   On: 03/12/2022 22:10   DG Ankle Complete Left  Result Date: 03/12/2022 CLINICAL DATA:  Fall EXAM: LEFT ANKLE COMPLETE - 3+ VIEW COMPARISON:  None Available. FINDINGS: Plantar calcaneal spur. No acute bony abnormality. Specifically, no fracture, subluxation, or dislocation. Joint spaces maintained. IMPRESSION: No acute bony abnormality. Electronically Signed   By: Rolm Baptise M.D.   On: 03/12/2022 22:10   DG Hand Complete Right  Result Date: 03/12/2022 CLINICAL DATA:  Fall EXAM: RIGHT HAND - COMPLETE 3+ VIEW COMPARISON:  None Available. FINDINGS: There is no evidence of fracture or dislocation. There is no evidence of arthropathy or other focal bone abnormality. Soft tissues are unremarkable. IMPRESSION: Negative. Electronically Signed   By: Rolm Baptise M.D.   On: 03/12/2022 22:09   DG Chest 2 View  Result Date: 03/12/2022 CLINICAL DATA:  Fall EXAM: CHEST - 2 VIEW COMPARISON:  01/07/2020 FINDINGS: Heart and mediastinal contours are within normal limits. No focal opacities or effusions. No acute bony  abnormality. IMPRESSION: No active cardiopulmonary disease. Electronically Signed   By: Rolm Baptise M.D.   On: 03/12/2022 22:09    Procedures Procedures    Medications Ordered in ED Medications  morphine (PF) 4 MG/ML injection 4 mg (4 mg Intravenous Given 03/12/22 2233)  ondansetron (ZOFRAN) injection 4 mg (4 mg Intravenous Given 03/12/22 2232)    ED Course/ Medical Decision Making/ A&P                             Medical Decision Making Amount and/or Complexity of Data Reviewed Labs: ordered. Radiology: ordered.  Risk Prescription drug management.   This patient presents to the ED  for concern of fall, this involves an extensive number of treatment options, and is a complaint that carries with it a high risk of complications and morbidity.  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  The pertinent results include:  cbc nl   Imaging Studies ordered:  I ordered imaging studies including ct head/ct c-spine, cxr, right hand, left ankle, right tib/fib  I independently visualized and interpreted imaging which showed  CXR: No active cardiopulmonary disease.  R hand: Negative.  L ankle: No acute bony abnormality.  R tib/fib: Negative.  CT head/C-spine: CT HEAD:    No acute intracranial abnormality.    CT CERVICAL SPINE:    1. No acute fracture or traumatic subluxation.  2. 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 Facet  joint arthropathy with mild right neural foraminal stenosis at  C4-C5.   I agree with the radiologist interpretation   Cardiac  Monitoring:  The patient was maintained on a cardiac monitor.  I personally viewed and interpreted the cardiac monitored which showed an underlying rhythm of: nsr   Medicines ordered and prescription drug management:  I ordered medication including morphine/zofran  for pain and nausea  Reevaluation of the patient after these medicines showed that the patient improved I have reviewed the patients home medicines and have made adjustments as needed   Test Considered:  ct   Critical Interventions:  Pain control   Problem List / ED Course:  Mechanical fall:  no internal injuries or fx.  Pt is stable for d/c.  Return if worse.    Reevaluation:  After the interventions noted above, I reevaluated the patient and found that they have :improved   Social Determinants of Health:  Lives at home   Dispostion:  After consideration of the diagnostic results and the patients response to treatment, I feel that the patent would benefit from discharge with outpatient f/u.          Final Clinical Impression(s) / ED Diagnoses Final diagnoses:  Fall, initial encounter  Multiple abrasions    Rx / DC Orders ED Discharge Orders          Ordered    ibuprofen (ADVIL) 600 MG tablet  Every 6 hours PRN        03/12/22 2301    HYDROcodone-acetaminophen (NORCO/VICODIN) 5-325 MG tablet  Every 6 hours PRN        03/12/22 2305              Isla Pence, MD 03/12/22 2307

## 2022-03-12 NOTE — ED Notes (Signed)
Right knee and right lower leg cleaned w/ NS/ wrapped with non-adherent and kerlix/ pt refused for bandage to be placed on forehead, forehead cleaned with NS, bleeding controlled/ right and left 5th finger wrapped with bandaid

## 2022-03-12 NOTE — ED Triage Notes (Signed)
Pt with baseline expressive aphasia from a fall in April presents for fall (pt unsure if she was dizzy prior or mech). BIB her friend. Injury noted to forehead, R lower leg, and nose.   Not on thinners.   Unsure of details of fall but otherwise oriented.

## 2022-03-12 NOTE — ED Notes (Signed)
Pt ambulated to baseline/ pt uses cane at home

## 2022-03-14 ENCOUNTER — Ambulatory Visit (INDEPENDENT_AMBULATORY_CARE_PROVIDER_SITE_OTHER): Payer: Medicaid Other | Admitting: Sports Medicine

## 2022-03-14 VITALS — BP 138/82 | HR 66 | Ht 67.0 in | Wt 216.0 lb

## 2022-03-14 DIAGNOSIS — R4586 Emotional lability: Secondary | ICD-10-CM | POA: Diagnosis not present

## 2022-03-14 DIAGNOSIS — S069XAA Unspecified intracranial injury with loss of consciousness status unknown, initial encounter: Secondary | ICD-10-CM

## 2022-03-14 DIAGNOSIS — R270 Ataxia, unspecified: Secondary | ICD-10-CM

## 2022-03-14 DIAGNOSIS — R4701 Aphasia: Secondary | ICD-10-CM | POA: Diagnosis not present

## 2022-03-14 DIAGNOSIS — F8081 Childhood onset fluency disorder: Secondary | ICD-10-CM

## 2022-03-14 NOTE — Patient Instructions (Addendum)
Good to see you Brain MRI with contrast  We will look for a neurologist who see TBI patients and refer you to them  We will set up a telephone follow up visit to discuss MRI

## 2022-03-14 NOTE — Progress Notes (Signed)
Andrea Blackwell D.[place] [facility] Phone: [phone]  Assessment and Plan:     1. Traumatic brain injury, with unknown loss of consciousness status, initial encounter ([facility]) 2. Concussion without loss of consciousness, initial encounter 3. Aphasia 4. Ataxia 5. Stuttering 6. Labile mood  -Chronic with exacerbation, complicated, initial sports medicine visit - This is my first time seeing patient who has been undergoing treatment for concussion since a fall on 05/25/2021, and now has a new fall on 03/12/2022.  She presents today with multiple and significant symptoms of traumatic brain injury.  I do not know which are baseline for her which are dramatically changed since her new fall.  Patient has trouble verbalizing how much worse she feels after her fall, but states that her memory is significantly worse, she has increased irritability, and "does not feel right".  Reassuring that patient had relatively unremarkable CT head and C-spine performed at ER visit on 03/12/2022, however with patient's significant symptoms at clinic today, I feel it is necessary to perform brain MRI with contrast to further evaluate.  Patient has unremarkable brain MRI from 06/28/2021, however she states symptoms have significantly changed since this time. - Patient's symptoms are more consistent with TBI rather than concussion (mTBI) as she has reported new stuttering, concentration issues, decreased mental capacity, increased emotionality, dizziness, vision changes, ataxia that have been present ever since injury on 05/25/2021.  She has had ongoing treatment with neuropsychology at [facility] and has trialed multiple medications including but not limited to nortriptyline, sertraline, escitalopram, Risperdal, and has had significant and negative reactions to all of these medications. - Based on the chronic nature of patient's symptoms, the severity of patient's  symptoms, patient's failure to improve with other conservative treatments, I feel patient should be evaluated for TBI by neurology.  Our local neurology department does not see patients if they have been associated with a concussion diagnosis, so we will look elsewhere for neurology group that would see patient. - Unfortunately, patient has tried the treatment modalities that I would use for concussion patients and has seen no improvement in symptoms, so I do not feel that I have any further treatment to offer her.  I will further evaluate with brain MRI to make sure we are not missing a new diagnosis, but otherwise would defer to neurology for further treatment and recommendations.   Date of injury was 03/12/2022. Original symptom severity scores were 20 and 112. The patient was counseled on the nature of the injury, typical course and potential options for further evaluation and treatment. Discussed the importance of compliance with recommendations. Patient stated understanding of this plan and willingness to comply.  - We will call and schedule a telephone encounter after brain MRI has been performed so that we can update on results and neurology referral  Pertinent previous records reviewed include ED note 03/12/2022, neuropsychology note 03/04/2022, neuropsychology note 02/11/2022   Time of visit 52 minutes, which included chart review, physical exam, treatment plan, symptom severity score, VOMS, and tandem gait testing being performed, interpreted, and discussed with patient at today's visit.   Subjective:   I, Andrea Blackwell, am serving as a Education administrator for Doctor Glennon Mac  Chief Complaint: concussion symptoms   HPI:   03/14/22 Patient is a 67 year old female complaining of concussion symptoms. Patient states Pt's friend was helping her jump her car and pt tripped on a curb. She did not have a  loc, but did hit her head and hurt her right hand, right leg, and left ankle. She is not on blood  thinners. Had another fall in April    Concussion HPI:  - Injury date: 03/12/2022   - Mechanism of injury: fall   - LOC: no  - Initial evaluation: ED  - Previous head injuries/concussions: yes last April    - Previous imaging: yes     - Social history:   Hospitalization for head injury? No Diagnosed/treated for headache disorder, migraines, or seizures? No Diagnosed with learning disability Angie Fava? No Diagnosed with ADD/ADHD? No Diagnose with Depression, anxiety, or other Psychiatric Disorder? Yes , depression is well managed    Current medications:  Current Outpatient Medications  Medication Sig Dispense Refill   ALPRAZolam (XANAX) 0.5 MG tablet Take 0.5 mg by mouth at bedtime as needed for anxiety.     Atogepant (QULIPTA) 60 MG TABS Take 1 tablet by mouth daily.     fluticasone (FLONASE) 50 MCG/ACT nasal spray PLACE 2 SPRAYS INTO BOTH NOSTRILS DAILY. 16 g 6   HYDROcodone-acetaminophen (NORCO/VICODIN) 5-325 MG tablet Take 1 tablet by mouth every 6 (six) hours as needed for up to 10 doses for severe pain. 10 tablet 0   ibuprofen (ADVIL) 600 MG tablet Take 1 tablet (600 mg total) by mouth every 6 (six) hours as needed. 30 tablet 0   levothyroxine (SYNTHROID) 100 MCG tablet Take 100 mcg by mouth daily before breakfast.     Multiple Vitamin (MULTIVITAMIN WITH MINERALS) TABS tablet Take 1 tablet by mouth daily.     naltrexone (DEPADE) 50 MG tablet Take 25 mg by mouth at bedtime.     propranolol ER (INDERAL LA) 60 MG 24 hr capsule Take 60 mg by mouth daily.     omega-3 acid ethyl esters (LOVAZA) 1 g capsule Take 2 capsules (2 g total) by mouth 2 (two) times daily. 120 capsule 2   No current facility-administered medications for this visit.      Objective:     Vitals:   03/14/22 1530  BP: 138/82  Pulse: 66  SpO2: 100%  Weight: 216 lb (98 kg)  Height: '5\' 7"'$  (1.702 m)      Body mass index is 33.83 kg/m.    Physical Exam:     General: Well-appearing, cooperative, sitting  comfortably in no acute distress.  Psychiatric: Mood and affect are appropriate.   Neuro:sensation intact and strength 5/5 with no deficits, no atrophy, normal muscle tone   Today's Symptom Severity Score:  Scores: 0-6  Headache:6 "Pressure in head":6  Neck Pain:6 Nausea or vomiting:6 Dizziness:3 Blurred vision:0 Balance problems:6 Sensitivity to light:6 Sensitivity to noise:6 Feeling slowed down:6 Feeling like "in a fog":5 "Don't feel right":6 Difficulty concentrating:5 Difficulty remembering:6  Fatigue or low energy:6 Confusion:6  Drowsiness:6  More emotional:6 Irritability:6 Sadness:6  Nervous or Anxious:6 Trouble falling or staying asleep:0  Total number of symptoms: 20/22  Symptom Severity index: 115/132  Worse with physical activity? Yes  Worse with mental activity? Yes  Percent improved since injury: 0%    Full pain-free cervical PROM: No, restricted range of motion in all directions with patient citing pain is a limiting factor   Cognitive:  -Unable to perform.  Broke out in tears.  mVOMS:   -Unable to perform due to patient's symptoms   Autonomic:  -Unable to perform due to patient's symptoms  Complex Tandem Gait: -Unable to perform due to patient unsteadiness.  In wheelchair during visit.  Electronically signed  by:  Andrea Blackwell D.[facility] 4:39 PM 03/14/22

## 2022-03-28 ENCOUNTER — Other Ambulatory Visit: Payer: Medicaid Other

## 2022-03-28 ENCOUNTER — Ambulatory Visit (INDEPENDENT_AMBULATORY_CARE_PROVIDER_SITE_OTHER): Payer: Medicaid Other

## 2022-03-28 DIAGNOSIS — R270 Ataxia, unspecified: Secondary | ICD-10-CM

## 2022-03-28 DIAGNOSIS — R4701 Aphasia: Secondary | ICD-10-CM | POA: Diagnosis not present

## 2022-03-28 DIAGNOSIS — R4586 Emotional lability: Secondary | ICD-10-CM

## 2022-03-28 DIAGNOSIS — F8081 Childhood onset fluency disorder: Secondary | ICD-10-CM

## 2022-03-28 DIAGNOSIS — S069XAA Unspecified intracranial injury with loss of consciousness status unknown, initial encounter: Secondary | ICD-10-CM | POA: Diagnosis not present

## 2022-03-28 MED ORDER — GADOBUTROL 1 MMOL/ML IV SOLN
10.0000 mL | Freq: Once | INTRAVENOUS | Status: AC | PRN
  Administered 2022-03-28: 10 mL via INTRAVENOUS

## 2022-03-29 ENCOUNTER — Other Ambulatory Visit: Payer: Self-pay | Admitting: Sports Medicine

## 2022-03-29 DIAGNOSIS — F8081 Childhood onset fluency disorder: Secondary | ICD-10-CM

## 2022-03-29 DIAGNOSIS — R4701 Aphasia: Secondary | ICD-10-CM

## 2022-03-29 DIAGNOSIS — S069XAA Unspecified intracranial injury with loss of consciousness status unknown, initial encounter: Secondary | ICD-10-CM

## 2022-03-29 DIAGNOSIS — R270 Ataxia, unspecified: Secondary | ICD-10-CM

## 2022-03-29 NOTE — Progress Notes (Unsigned)
Called and notified of referral to [facility] and she vocalized understanding

## 2022-03-29 NOTE — Progress Notes (Signed)
[facility] neurology for traumatic brain injury, stuttering, vision changes, ataxia

## 2022-07-08 ENCOUNTER — Telehealth: Payer: Self-pay | Admitting: Sports Medicine

## 2022-07-08 NOTE — Telephone Encounter (Signed)
Pt called about Neuro referral from February. She called them today and they stated they did not receive.  I noted pt is seeing [facility] regularly for what appears to be the same diagnoses.  Would she also need to see [facility]?  I am happy to refax referral, but was not confident it was the right thing to do. Pt noted a second fall recently and her pain management provider recommended she see Neuro.

## 2022-07-11 NOTE — Telephone Encounter (Signed)
Faxed referral

## 2022-07-11 NOTE — Telephone Encounter (Signed)
Please refax February referral to [facility] at [phone]. (Pt notified via MyChart)

## 2022-07-12 NOTE — Therapy (Signed)
[place]  [facility] [address], [address] [place], [place], [address] Phone: [phone]   Fax:  [phone]  Patient Details  Name: Andrea Blackwell MRN: [id number] Date of Birth: January 15, 1956 Referring Provider:  No ref. provider found  Encounter Date: 07/12/2022  SPEECH THERAPY DISCHARGE SUMMARY  Visits from Start of Care: 7  Current functional level related to goals / functional outcomes: Pt did not attend any more sessions following the session on 11-18-21; At that time SLP strongly suggested pt obtain some assistance from psychiatry or neuropsychatry/psychology to alleviate some of the stress/anxiety pt was feeling that inhibited success in skilled ST. To date, she is still receiving neuropsych therapy at [facility]. She will formally be d/c'd at this time. Goals and impression from her last attended session in October 2023 are included below: SHORT TERM GOALS: Target date: 11/04/2021     Pt will demo easy onset of voicing with vowel+consonant syllables and words 40% success in 2 sessions Baseline: Goal status: Revised   2.  Pt will demonstrate slowed speech rate when reading words and phrases, in 4 sessions Baseline:  Goal status: Not met   3.  When experiencing multiple reps during reading words, pt will cease verbalization and take a "refresher/cleansing" breath, in 25% of opportunities  Baseline:  Goal status: Not met   4.  Pt will seek out psychological therapy/treatment dealing with her life changes post-fall, including changes in speech pattern Baseline:  Goal status: Met   5.  Pt will tell SLP 2 new practical memory and attention compensations she could use, in 3 sessions Baseline:  Goal status: Not met   6.  Pt will complete PROM as listed above Baseline:  Goal status: Not met   LONG TERM GOALS: Target date:  12-03-21      Pt will demo easy onset of voicing with 2-3 syllable stimuli 60% success in 2 sessions Baseline:   Goal status: Ongoing   2.  Pt will demonstrate slowed speech rate when reading 5-6 word sentences, in 3 sessions Baseline:  Goal status: Ongoing   3.  When experiencing multiple reps, pt will cease verbalization and use a "reset" technique, in 70% of opportunities Baseline:  Goal status: Ongoing   4.  Pt will tell SLP about or demo successful use of memory compensations in 3 sessions Baseline:  Goal status: Ongoing   5.  Pt will tell SLP about or demo successful use of attention compensations in 3 sessions Baseline:  Goal status: Ongoing   6.  Pt will score higher QOL on PROMs than on initial administration Baseline:  Goal status: Ongoing   ASSESSMENT:   CLINICAL IMPRESSION: Pt did not meet any STGs. Patient is a 67 y.o. female who was seen today for therapy for speech/fluency changes since two weeks after a fall in April as well as compensations for attention and memory. SLP cont to believe pt's dysfluency is primarily psychogenic with a small neurogenic component, if at all. It is this SLP's opinion that progress with dysfluency will be limited without initial psychological therapy. Pt's deficits are significantly negatively impacting her verbal communication - she is able to adequately write out messages but this is very inefficient and impossible while using the telephone. SEE TX NOTE. She is currently undergoing neuropsychiatric testing at [facility] in [place]. SLP is attempting to obtain those records in order to have more of an idea about pt's feasibility to cont skilled ST without psych  assistance.    Remaining deficits: Assumed that deficits remain, however uncertain as this d/c is unexpected.   Education / Equipment: See therapy notes for details.    Patient agrees to discharge. Patient goals were not met. Patient is being discharged due to not returning since the last visit.. If pt's anxiety and psychological sx are well-managed she could return  to ST if her dysfluency still exists. Another prescription/referral will be necessary.    Tareek Sabo, CCC-SLP 07/12/2022, 1:55 PM  [place] [facility] [address], [address] [place], [place], [address] Phone: [phone]   Fax:  [phone]

## 2022-07-21 DIAGNOSIS — R7309 Other abnormal glucose: Secondary | ICD-10-CM | POA: Diagnosis not present

## 2022-07-21 DIAGNOSIS — R2681 Unsteadiness on feet: Secondary | ICD-10-CM | POA: Diagnosis not present

## 2022-07-21 DIAGNOSIS — E559 Vitamin D deficiency, unspecified: Secondary | ICD-10-CM | POA: Diagnosis not present

## 2022-07-21 DIAGNOSIS — F8081 Childhood onset fluency disorder: Secondary | ICD-10-CM | POA: Diagnosis not present

## 2022-07-21 DIAGNOSIS — E039 Hypothyroidism, unspecified: Secondary | ICD-10-CM | POA: Diagnosis not present

## 2022-07-21 DIAGNOSIS — R2689 Other abnormalities of gait and mobility: Secondary | ICD-10-CM | POA: Diagnosis not present

## 2022-07-21 DIAGNOSIS — D72819 Decreased white blood cell count, unspecified: Secondary | ICD-10-CM | POA: Diagnosis not present

## 2022-07-21 DIAGNOSIS — Z0000 Encounter for general adult medical examination without abnormal findings: Secondary | ICD-10-CM | POA: Diagnosis not present

## 2022-07-21 DIAGNOSIS — L304 Erythema intertrigo: Secondary | ICD-10-CM | POA: Diagnosis not present

## 2022-07-21 DIAGNOSIS — Z1159 Encounter for screening for other viral diseases: Secondary | ICD-10-CM | POA: Diagnosis not present

## 2022-07-28 DIAGNOSIS — F8081 Childhood onset fluency disorder: Secondary | ICD-10-CM | POA: Diagnosis not present

## 2022-07-28 DIAGNOSIS — S069X0D Unspecified intracranial injury without loss of consciousness, subsequent encounter: Secondary | ICD-10-CM | POA: Diagnosis not present

## 2022-07-28 DIAGNOSIS — R269 Unspecified abnormalities of gait and mobility: Secondary | ICD-10-CM | POA: Diagnosis not present

## 2022-08-03 DIAGNOSIS — E349 Endocrine disorder, unspecified: Secondary | ICD-10-CM | POA: Diagnosis not present

## 2022-08-03 DIAGNOSIS — Z1231 Encounter for screening mammogram for malignant neoplasm of breast: Secondary | ICD-10-CM | POA: Diagnosis not present

## 2022-08-12 ENCOUNTER — Other Ambulatory Visit (HOSPITAL_COMMUNITY)
Admission: RE | Admit: 2022-08-12 | Discharge: 2022-08-12 | Disposition: A | Discharge status: Home / Self Care | Payer: 59 | Source: Ambulatory Visit | Attending: Nurse Practitioner | Admitting: Nurse Practitioner

## 2022-08-12 DIAGNOSIS — Z9189 Other specified personal risk factors, not elsewhere classified: Secondary | ICD-10-CM | POA: Diagnosis not present

## 2022-08-12 DIAGNOSIS — Z13220 Encounter for screening for lipoid disorders: Secondary | ICD-10-CM | POA: Diagnosis not present

## 2022-08-12 DIAGNOSIS — Z136 Encounter for screening for cardiovascular disorders: Secondary | ICD-10-CM | POA: Diagnosis not present

## 2022-08-12 DIAGNOSIS — Z01419 Encounter for gynecological examination (general) (routine) without abnormal findings: Secondary | ICD-10-CM | POA: Insufficient documentation

## 2022-08-12 DIAGNOSIS — Z1151 Encounter for screening for human papillomavirus (HPV): Secondary | ICD-10-CM | POA: Diagnosis not present

## 2022-08-15 LAB — CYTOLOGY - PAP
Comment: NEGATIVE
Diagnosis: NEGATIVE
High risk HPV: NEGATIVE

## 2022-08-17 DIAGNOSIS — E7800 Pure hypercholesterolemia, unspecified: Secondary | ICD-10-CM | POA: Diagnosis not present

## 2022-08-25 ENCOUNTER — Other Ambulatory Visit: Payer: Self-pay

## 2022-08-25 DIAGNOSIS — R9389 Abnormal findings on diagnostic imaging of other specified body structures: Secondary | ICD-10-CM | POA: Diagnosis not present

## 2022-09-09 DIAGNOSIS — H903 Sensorineural hearing loss, bilateral: Secondary | ICD-10-CM | POA: Diagnosis not present

## 2022-09-09 DIAGNOSIS — H9313 Tinnitus, bilateral: Secondary | ICD-10-CM | POA: Diagnosis not present

## 2022-09-12 ENCOUNTER — Encounter: Payer: Self-pay | Admitting: Cardiovascular Disease

## 2022-09-12 ENCOUNTER — Ambulatory Visit: Payer: 59 | Attending: Cardiovascular Disease | Admitting: Cardiovascular Disease

## 2022-09-12 ENCOUNTER — Other Ambulatory Visit: Payer: Self-pay

## 2022-09-12 VITALS — BP 106/66 | HR 64 | Ht 66.0 in | Wt 211.0 lb

## 2022-09-12 DIAGNOSIS — R9431 Abnormal electrocardiogram [ECG] [EKG]: Secondary | ICD-10-CM | POA: Diagnosis not present

## 2022-09-12 DIAGNOSIS — R072 Precordial pain: Secondary | ICD-10-CM

## 2022-09-12 DIAGNOSIS — E7800 Pure hypercholesterolemia, unspecified: Secondary | ICD-10-CM

## 2022-09-12 NOTE — Patient Instructions (Signed)
Medication Instructions:  Continue same medications  Lab Work: Have a Lipid Panel done in 3 months  first of Nov   Testing/Procedures: Echo   Follow-Up: At [facility], you and your health needs are our priority.  As part of our continuing mission to provide you with exceptional heart care, we have created designated Provider Care Teams.  These Care Teams include your primary Cardiologist (physician) and Advanced Practice Providers (APPs -  Physician Assistants and Nurse Practitioners) who all work together to provide you with the care you need, when you need it.  We recommend signing up for the patient portal called "MyChart".  Sign up information is provided on this After Visit Summary.  MyChart is used to connect with patients for Virtual Visits (Telemedicine).  Patients are able to view lab/test results, encounter notes, upcoming appointments, etc.  Non-urgent messages can be sent to your provider as well.   To learn more about what you can do with MyChart, go to [email or web address].    Your next appointment:  6 months    Call in Oct to schedule Feb appointment     Provider:  Dr.Croitoru

## 2022-09-12 NOTE — Progress Notes (Signed)
Cardiology Office Note:  .   Date:  09/18/2022  ID:  Corky Sox, DOB 09/17/1955, MRN [id number] PCP: Irven Coe, MD  [facility] Providers Cardiologist:  None    History of Present Illness: .   Andrea Blackwell is a 67 y.o. 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  She is referred in consultation by Dr. Lewie Chamber for an abnormal electrocardiogram.  Her ECG showed normal sinus rhythm, left axis deviation and " septal infarct".  The computer algorithm probably called the septal infarct because there is a tiny Q waves in lead V2 and there is poor R wave progression.  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  There is no ST segment deviation.  The QTc is 415 ms.  She is not troubled by chest pain with activity, but remembers having an episode of "horrible chest pain" that lasted for just under an hour about 7 years ago.  She had an EKG at that time that was normal.  She has mildly elevated LP(a) at 89.  There is a family history of cardiovascular disease, but not in any immediate relatives.  She quit smoking in 1999.  She does not have other risk factors for coronary disease.  Her activity is limited by her neurological issues.  She does not have dyspnea at rest with activity, orthopnea, PND, lower extremity edema, claudication, palpitations.  She had an initial fall in April 2023 and then a second fall in February 2024 and has been dealing with postconcussive symptoms ever since.  She has had trouble focusing, gets easily anxious when there is too much input.  Is stuttering a lot.  Has difficulty finding her words.  Has a lot of trouble with balance and is using a walker.  She has not had any falls in the last 6 months.  She remains very frustrated about her reduced functional  mobility, but is making efforts to improve her lifestyle.  She gained about 30 pounds after her head trauma and has managed to lose 31 pounds by watching her diet (no processed food, no added sugar) just in the last few months.  She struggles a lot with anxiety.  She has recently started taking atorvastatin.  She takes propranolol for anxiety, not for high blood pressure.  She does not have diabetes mellitus.  She quit smoking in 1999 and has a roughly 40-pack-year history of smoking (1-1/2 packs a day for 25 years).  Her maternal grandmother had a heart attack at a "young age" in her maternal grandfather had mini strokes in the 32s.  Her father died of lung cancer around the age of 42.  ROS: As above  Studies Reviewed: Marland Kitchen   EKG Interpretation Date/Time:  Monday September 12 2022 10:00:51 EDT Ventricular Rate:  64 PR Interval:  152 QRS Duration:  88 QT Interval:  396 QTC Calculation: 408 R Axis:   -40  Text Interpretation: Normal sinus rhythm Left axis deviation Moderate voltage criteria for LVH, may be normal variant ( R in aVL , Cornell product ) Septal infarct , age undetermined No previous ECGs available Confirmed by Cuinn Westerhold, Rachelle Hora [phone]) on 09/12/2022 10:19:44 AM  Reviewed ECG from PCP visit (hard to see the date, but I think it is from June 13).  It shows normal sinus rhythm and probable left  anterior fascicular block which explains the repolarization changes and the poor R wave progression across the anterior precordial leads.  I think the little Q wave in lead V2 is related to lead placement (probably placed too high).  Labs from 07/22/2022 PCP Hemoglobin A1c 5.3% Hemoglobin 15 Glucose 87, creatinine 0.71, normal electrolytes and liver function tests, normal TSH and free T4  Most recent lipid profile available for review is from 11/20/2018 Cholesterol 272, HDL 85, LDL 173, triglycerides 86  Risk Assessment/Calculations:          Physical Exam:   VS:  BP 106/66 (BP Location: Left  Arm, Patient Position: Sitting, Cuff Size: Large)   Pulse 64   Ht 5\' 6"  (1.676 m)   Wt 211 lb (95.7 kg)   SpO2 96%   BMI 34.06 kg/m    Wt Readings from Last 3 Encounters:  09/12/22 211 lb (95.7 kg)  03/14/22 216 lb (98 kg)  03/12/22 216 lb 0.8 oz (98 kg)    GEN: Well nourished, well developed in no acute distress, mildly obese NECK: No JVD; No carotid bruits CARDIAC: RRR, no murmurs, rubs, gallops RESPIRATORY:  Clear to auscultation without rales, wheezing or rhonchi  ABDOMEN: Soft, non-tender, non-distended EXTREMITIES:  No edema; No deformity   ASSESSMENT AND PLAN: .   Abnormal electrocardiogram: She has no active symptoms that would suggest coronary disease but remembers an episode of severe chest pain 7 years ago.  Cannot rule out that she has had a previous septal infarct, but I think it is more likely that her EKG changes are related to conduction abnormality.  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  I think we can settle this with an echocardiogram.  If there is normal regional wall motion and normal overall LV systolic function, I do not think additional evaluation will be needed.  However if we do confirm a previous septal infarction by echocardiography will then proceed to coronary evaluation, preferably with a CT angiogram. HLP:  The most recent lipid profile I can find is from 2020 and at that point she had a markedly elevated total cholesterol 272 and LDL cholesterol 173, LDL with an excellent HDL cholesterol..  She has just been started on atorvastatin and will plan to repeat lipid profile after she has been on therapy for 2 to 3 months. Traumatic brain injury: This is clearly having the biggest current impact on her functional ability and health.       Dispo: Check echocardiogram.  Further evaluation depending on the results. Patient Instructions  Medication Instructions:  Continue same medications  Lab Work: Have a  Lipid Panel done in 3 months  first of Nov   Testing/Procedures: Echo   Follow-Up: At [facility], you and your health needs are our priority.  As part of our continuing mission to provide you with exceptional heart care, we have created designated Provider Care Teams.  These Care Teams include your primary Cardiologist (physician) and Advanced Practice Providers (APPs -  Physician Assistants and Nurse Practitioners) who all work together to provide you with the care you need, when you need it.  We recommend signing up for the patient portal called "MyChart".  Sign up information is provided on this After Visit Summary.  MyChart is used to connect with patients for Virtual Visits (Telemedicine).  Patients are able to view lab/test results, encounter notes, upcoming appointments, etc.  Non-urgent messages can be sent to your provider as  well.   To learn more about what you can do with MyChart, go to [email or web address].    Your next appointment:  6 months    Call in Oct to schedule Feb appointment     Provider:  Dr.Odel Schmid   Signed, Thurmon Fair, MD

## 2022-09-27 ENCOUNTER — Ambulatory Visit (HOSPITAL_COMMUNITY): Payer: 59

## 2022-10-14 ENCOUNTER — Ambulatory Visit (HOSPITAL_COMMUNITY): Payer: 59 | Attending: Cardiology

## 2022-10-14 DIAGNOSIS — R9431 Abnormal electrocardiogram [ECG] [EKG]: Secondary | ICD-10-CM | POA: Diagnosis not present

## 2022-10-14 DIAGNOSIS — R072 Precordial pain: Secondary | ICD-10-CM | POA: Diagnosis not present

## 2022-10-14 LAB — ECHOCARDIOGRAM COMPLETE
Area-P 1/2: 2.17 cm2
S' Lateral: 3.1 cm

## 2022-10-14 NOTE — Progress Notes (Unsigned)
44

## 2022-10-29 LAB — AMB RESULTS CONSOLE CBG: Glucose: 95

## 2023-01-18 DIAGNOSIS — L089 Local infection of the skin and subcutaneous tissue, unspecified: Secondary | ICD-10-CM | POA: Diagnosis not present

## 2023-01-18 DIAGNOSIS — S99921A Unspecified injury of right foot, initial encounter: Secondary | ICD-10-CM | POA: Diagnosis not present

## 2023-01-27 DIAGNOSIS — F8081 Childhood onset fluency disorder: Secondary | ICD-10-CM | POA: Diagnosis not present

## 2023-01-27 DIAGNOSIS — R269 Unspecified abnormalities of gait and mobility: Secondary | ICD-10-CM | POA: Diagnosis not present

## 2023-01-27 DIAGNOSIS — F482 Pseudobulbar affect: Secondary | ICD-10-CM | POA: Diagnosis not present

## 2023-03-05 DIAGNOSIS — J019 Acute sinusitis, unspecified: Secondary | ICD-10-CM | POA: Diagnosis not present

## 2023-03-31 ENCOUNTER — Ambulatory Visit: Payer: 59 | Admitting: Cardiovascular Disease

## 2023-04-21 ENCOUNTER — Other Ambulatory Visit (HOSPITAL_COMMUNITY): Payer: Self-pay

## 2023-04-21 MED ORDER — ESCITALOPRAM OXALATE 5 MG PO TABS
5.0000 mg | ORAL_TABLET | Freq: Every day | ORAL | 0 refills | Status: AC
Start: 2023-04-21 — End: ?
  Filled 2023-04-21: qty 7, 7d supply, fill #0

## 2023-04-29 ENCOUNTER — Other Ambulatory Visit (HOSPITAL_COMMUNITY): Payer: Self-pay

## 2023-05-01 ENCOUNTER — Other Ambulatory Visit (HOSPITAL_COMMUNITY): Payer: Self-pay

## 2023-05-01 MED ORDER — NUEDEXTA 20-10 MG PO CAPS
1.0000 | ORAL_CAPSULE | Freq: Two times a day (BID) | ORAL | 0 refills | Status: DC
  Filled 2023-05-01: qty 60, 30d supply, fill #0

## 2023-05-01 MED ORDER — PROPRANOLOL HCL ER 60 MG PO CP24
60.0000 mg | ORAL_CAPSULE | Freq: Every day | ORAL | 0 refills | Status: DC
  Filled 2023-05-01 (×2): qty 90, 90d supply, fill #0

## 2023-05-03 ENCOUNTER — Other Ambulatory Visit (HOSPITAL_COMMUNITY): Payer: Self-pay

## 2023-05-04 ENCOUNTER — Other Ambulatory Visit (HOSPITAL_COMMUNITY): Payer: Self-pay

## 2023-05-05 ENCOUNTER — Other Ambulatory Visit (HOSPITAL_COMMUNITY): Payer: Self-pay

## 2023-05-05 MED ORDER — QULIPTA 60 MG PO TABS
60.0000 mg | ORAL_TABLET | Freq: Every day | ORAL | 1 refills | Status: DC
  Filled 2023-05-05: qty 30, 30d supply, fill #0

## 2023-05-05 MED ORDER — QULIPTA 60 MG PO TABS
60.0000 mg | ORAL_TABLET | Freq: Every day | ORAL | 1 refills | Status: DC
  Filled 2023-05-05: qty 30, 30d supply, fill #0
  Filled 2023-06-09: qty 30, 30d supply, fill #1
  Filled 2024-01-11 – 2024-01-12 (×3): qty 30, 30d supply, fill #2

## 2023-05-05 MED ORDER — NURTEC 75 MG PO TBDP
75.0000 mg | ORAL_TABLET | Freq: Once | ORAL | 1 refills | Status: DC | PRN
  Filled 2023-05-05: qty 18, 30d supply, fill #0
  Filled 2023-06-09: qty 18, 30d supply, fill #1

## 2023-05-05 MED ORDER — PROPRANOLOL HCL ER 60 MG PO CP24
60.0000 mg | ORAL_CAPSULE | Freq: Every day | ORAL | 1 refills | Status: DC
  Filled 2023-05-05 – 2023-06-23 (×4): qty 90, 90d supply, fill #0

## 2023-05-08 ENCOUNTER — Other Ambulatory Visit (HOSPITAL_COMMUNITY): Payer: Self-pay

## 2023-05-09 ENCOUNTER — Other Ambulatory Visit (HOSPITAL_COMMUNITY): Payer: Self-pay

## 2023-05-10 ENCOUNTER — Other Ambulatory Visit (HOSPITAL_COMMUNITY): Payer: Self-pay

## 2023-05-12 ENCOUNTER — Other Ambulatory Visit (HOSPITAL_COMMUNITY): Payer: Self-pay

## 2023-05-29 ENCOUNTER — Ambulatory Visit: Payer: 59 | Admitting: Cardiovascular Disease

## 2023-06-09 ENCOUNTER — Other Ambulatory Visit (HOSPITAL_COMMUNITY): Payer: Self-pay

## 2023-06-09 MED ORDER — NUEDEXTA 20-10 MG PO CAPS
1.0000 | ORAL_CAPSULE | Freq: Two times a day (BID) | ORAL | 2 refills | Status: DC
  Filled 2023-06-09: qty 60, 30d supply, fill #0
  Filled 2023-07-14 – 2023-07-18 (×2): qty 60, 30d supply, fill #1
  Filled 2023-08-15: qty 60, 30d supply, fill #2

## 2023-06-13 ENCOUNTER — Other Ambulatory Visit: Payer: Self-pay

## 2023-06-13 ENCOUNTER — Other Ambulatory Visit (HOSPITAL_COMMUNITY): Payer: Self-pay

## 2023-06-14 ENCOUNTER — Other Ambulatory Visit (HOSPITAL_COMMUNITY): Payer: Self-pay

## 2023-06-14 DIAGNOSIS — E559 Vitamin D deficiency, unspecified: Secondary | ICD-10-CM | POA: Diagnosis not present

## 2023-06-14 DIAGNOSIS — R2689 Other abnormalities of gait and mobility: Secondary | ICD-10-CM | POA: Diagnosis not present

## 2023-06-14 DIAGNOSIS — D72819 Decreased white blood cell count, unspecified: Secondary | ICD-10-CM | POA: Diagnosis not present

## 2023-06-14 DIAGNOSIS — L821 Other seborrheic keratosis: Secondary | ICD-10-CM | POA: Diagnosis not present

## 2023-06-14 DIAGNOSIS — R7303 Prediabetes: Secondary | ICD-10-CM | POA: Diagnosis not present

## 2023-06-14 DIAGNOSIS — Z0000 Encounter for general adult medical examination without abnormal findings: Secondary | ICD-10-CM | POA: Diagnosis not present

## 2023-06-14 DIAGNOSIS — E7800 Pure hypercholesterolemia, unspecified: Secondary | ICD-10-CM | POA: Diagnosis not present

## 2023-06-14 DIAGNOSIS — E039 Hypothyroidism, unspecified: Secondary | ICD-10-CM | POA: Diagnosis not present

## 2023-06-14 DIAGNOSIS — G43909 Migraine, unspecified, not intractable, without status migrainosus: Secondary | ICD-10-CM | POA: Diagnosis not present

## 2023-06-14 MED ORDER — QULIPTA 60 MG PO TABS
60.0000 mg | ORAL_TABLET | Freq: Every day | ORAL | 1 refills | Status: AC
  Filled 2023-06-14: qty 30, 30d supply, fill #0
  Filled 2023-07-14 – 2023-07-18 (×2): qty 90, 90d supply, fill #0
  Filled 2023-10-18: qty 90, 90d supply, fill #1

## 2023-06-14 MED ORDER — NURTEC 75 MG PO TBDP
75.0000 mg | ORAL_TABLET | ORAL | 1 refills | Status: DC | PRN
  Filled 2023-07-14 – 2023-07-18 (×2): qty 18, 30d supply, fill #0
  Filled 2023-08-15: qty 18, 30d supply, fill #1

## 2023-06-15 ENCOUNTER — Other Ambulatory Visit (HOSPITAL_COMMUNITY): Payer: Self-pay

## 2023-06-15 LAB — LAB REPORT - SCANNED
A1c: 6
EGFR: 98

## 2023-06-22 ENCOUNTER — Other Ambulatory Visit (HOSPITAL_COMMUNITY): Payer: Self-pay

## 2023-06-22 MED ORDER — ALPRAZOLAM 0.5 MG PO TABS
0.5000 mg | ORAL_TABLET | Freq: Every day | ORAL | 1 refills | Status: AC | PRN
Start: 2023-06-22 — End: ?
  Filled 2023-06-22 – 2023-06-23 (×2): qty 60, 30d supply, fill #0
  Filled 2023-09-19: qty 60, 30d supply, fill #1

## 2023-06-23 ENCOUNTER — Other Ambulatory Visit (HOSPITAL_COMMUNITY): Payer: Self-pay

## 2023-06-23 ENCOUNTER — Other Ambulatory Visit: Payer: Self-pay

## 2023-07-04 DIAGNOSIS — E7800 Pure hypercholesterolemia, unspecified: Secondary | ICD-10-CM | POA: Diagnosis not present

## 2023-07-04 DIAGNOSIS — R7303 Prediabetes: Secondary | ICD-10-CM | POA: Diagnosis not present

## 2023-07-14 ENCOUNTER — Other Ambulatory Visit (HOSPITAL_BASED_OUTPATIENT_CLINIC_OR_DEPARTMENT_OTHER): Payer: Self-pay

## 2023-07-14 ENCOUNTER — Other Ambulatory Visit (HOSPITAL_COMMUNITY): Payer: Self-pay

## 2023-07-17 ENCOUNTER — Other Ambulatory Visit (HOSPITAL_COMMUNITY): Payer: Self-pay

## 2023-07-18 ENCOUNTER — Other Ambulatory Visit (HOSPITAL_COMMUNITY): Payer: Self-pay

## 2023-07-24 ENCOUNTER — Encounter: Payer: Self-pay | Admitting: Internal Medicine

## 2023-08-04 DIAGNOSIS — D72819 Decreased white blood cell count, unspecified: Secondary | ICD-10-CM | POA: Diagnosis not present

## 2023-08-04 DIAGNOSIS — E039 Hypothyroidism, unspecified: Secondary | ICD-10-CM | POA: Diagnosis not present

## 2023-08-10 ENCOUNTER — Other Ambulatory Visit (HOSPITAL_COMMUNITY): Payer: Self-pay

## 2023-08-10 ENCOUNTER — Other Ambulatory Visit: Payer: Self-pay

## 2023-08-10 MED ORDER — LEVOTHYROXINE SODIUM 88 MCG PO TABS
88.0000 ug | ORAL_TABLET | Freq: Every morning | ORAL | 1 refills | Status: DC
  Filled 2023-08-10: qty 90, 90d supply, fill #0
  Filled 2023-11-07: qty 90, 90d supply, fill #1

## 2023-08-15 ENCOUNTER — Other Ambulatory Visit (HOSPITAL_COMMUNITY): Payer: Self-pay

## 2023-08-17 ENCOUNTER — Other Ambulatory Visit (HOSPITAL_COMMUNITY): Payer: Self-pay

## 2023-08-18 ENCOUNTER — Other Ambulatory Visit (HOSPITAL_COMMUNITY): Payer: Self-pay

## 2023-08-23 ENCOUNTER — Other Ambulatory Visit (HOSPITAL_COMMUNITY): Payer: Self-pay

## 2023-08-24 ENCOUNTER — Other Ambulatory Visit (HOSPITAL_COMMUNITY): Payer: Self-pay

## 2023-08-25 ENCOUNTER — Other Ambulatory Visit: Payer: Self-pay

## 2023-08-25 ENCOUNTER — Other Ambulatory Visit (HOSPITAL_COMMUNITY): Payer: Self-pay

## 2023-09-07 ENCOUNTER — Other Ambulatory Visit (HOSPITAL_BASED_OUTPATIENT_CLINIC_OR_DEPARTMENT_OTHER): Payer: Self-pay

## 2023-09-07 ENCOUNTER — Other Ambulatory Visit (HOSPITAL_COMMUNITY): Payer: Self-pay

## 2023-09-07 ENCOUNTER — Other Ambulatory Visit: Payer: Self-pay

## 2023-09-07 DIAGNOSIS — I2510 Atherosclerotic heart disease of native coronary artery without angina pectoris: Secondary | ICD-10-CM | POA: Diagnosis not present

## 2023-09-07 DIAGNOSIS — E7800 Pure hypercholesterolemia, unspecified: Secondary | ICD-10-CM | POA: Diagnosis not present

## 2023-09-07 DIAGNOSIS — R7303 Prediabetes: Secondary | ICD-10-CM | POA: Diagnosis not present

## 2023-09-07 MED ORDER — ROSUVASTATIN CALCIUM 5 MG PO TABS
5.0000 mg | ORAL_TABLET | Freq: Every day | ORAL | 3 refills | Status: DC
  Filled 2023-09-07: qty 90, 90d supply, fill #0

## 2023-09-07 MED ORDER — WEGOVY 0.25 MG/0.5ML ~~LOC~~ SOAJ
0.2500 mg | SUBCUTANEOUS | 0 refills | Status: DC
  Filled 2023-09-07: qty 2, 28d supply, fill #0

## 2023-09-19 ENCOUNTER — Other Ambulatory Visit: Payer: Self-pay

## 2023-09-19 ENCOUNTER — Other Ambulatory Visit (HOSPITAL_COMMUNITY): Payer: Self-pay

## 2023-09-21 ENCOUNTER — Other Ambulatory Visit (HOSPITAL_COMMUNITY): Payer: Self-pay

## 2023-09-21 MED ORDER — ALPRAZOLAM 0.5 MG PO TABS
0.5000 mg | ORAL_TABLET | Freq: Every day | ORAL | 1 refills | Status: AC | PRN
  Filled 2024-01-26: qty 60, 30d supply, fill #0
  Filled 2024-02-28: qty 60, 30d supply, fill #1

## 2023-09-21 MED ORDER — RIMEGEPANT SULFATE 75 MG PO TBDP
75.0000 mg | ORAL_TABLET | ORAL | 2 refills | Status: DC | PRN
  Filled 2023-09-21: qty 18, 30d supply, fill #0
  Filled 2023-10-18: qty 18, 30d supply, fill #1
  Filled 2024-02-09: qty 18, 30d supply, fill #2

## 2023-09-21 MED ORDER — DEXTROMETHORPHAN-QUINIDINE 20-10 MG PO CAPS
1.0000 | ORAL_CAPSULE | Freq: Two times a day (BID) | ORAL | 2 refills | Status: DC
  Filled 2023-09-21: qty 60, 30d supply, fill #0
  Filled 2023-10-18: qty 60, 30d supply, fill #1
  Filled 2023-11-30: qty 60, 30d supply, fill #2

## 2023-09-22 ENCOUNTER — Other Ambulatory Visit: Payer: Self-pay

## 2023-09-22 ENCOUNTER — Other Ambulatory Visit (HOSPITAL_COMMUNITY): Payer: Self-pay

## 2023-10-18 ENCOUNTER — Other Ambulatory Visit (HOSPITAL_COMMUNITY): Payer: Self-pay

## 2023-10-19 ENCOUNTER — Other Ambulatory Visit (HOSPITAL_COMMUNITY): Payer: Self-pay

## 2023-10-19 ENCOUNTER — Other Ambulatory Visit: Payer: Self-pay

## 2023-10-24 ENCOUNTER — Other Ambulatory Visit (HOSPITAL_COMMUNITY): Payer: Self-pay

## 2023-10-24 ENCOUNTER — Other Ambulatory Visit: Payer: Self-pay

## 2023-10-24 MED ORDER — PROPRANOLOL HCL ER 60 MG PO CP24
60.0000 mg | ORAL_CAPSULE | Freq: Every day | ORAL | 1 refills | Status: DC
  Filled 2023-10-24: qty 90, 90d supply, fill #0

## 2023-10-25 ENCOUNTER — Other Ambulatory Visit (HOSPITAL_COMMUNITY): Payer: Self-pay

## 2023-10-25 ENCOUNTER — Other Ambulatory Visit: Payer: Self-pay

## 2023-10-30 ENCOUNTER — Other Ambulatory Visit (HOSPITAL_COMMUNITY): Payer: Self-pay

## 2023-11-07 ENCOUNTER — Other Ambulatory Visit (HOSPITAL_COMMUNITY): Payer: Self-pay

## 2023-11-07 ENCOUNTER — Other Ambulatory Visit: Payer: Self-pay

## 2023-11-21 DIAGNOSIS — E7800 Pure hypercholesterolemia, unspecified: Secondary | ICD-10-CM | POA: Diagnosis not present

## 2023-11-24 DIAGNOSIS — Z23 Encounter for immunization: Secondary | ICD-10-CM | POA: Diagnosis not present

## 2023-11-29 ENCOUNTER — Other Ambulatory Visit (HOSPITAL_COMMUNITY): Payer: Self-pay

## 2023-11-29 MED ORDER — ROSUVASTATIN CALCIUM 20 MG PO TABS
20.0000 mg | ORAL_TABLET | Freq: Every day | ORAL | 3 refills | Status: DC
  Filled 2023-11-29: qty 90, 90d supply, fill #0

## 2023-11-30 ENCOUNTER — Other Ambulatory Visit (HOSPITAL_COMMUNITY): Payer: Self-pay

## 2023-12-03 ENCOUNTER — Emergency Department (HOSPITAL_BASED_OUTPATIENT_CLINIC_OR_DEPARTMENT_OTHER)
Admission: EM | Admit: 2023-12-03 | Discharge: 2023-12-03 | Disposition: A | Discharge status: Home / Self Care | Attending: Emergency Medicine | Admitting: Emergency Medicine

## 2023-12-03 ENCOUNTER — Encounter (HOSPITAL_BASED_OUTPATIENT_CLINIC_OR_DEPARTMENT_OTHER): Payer: Self-pay

## 2023-12-03 ENCOUNTER — Emergency Department (HOSPITAL_BASED_OUTPATIENT_CLINIC_OR_DEPARTMENT_OTHER)

## 2023-12-03 DIAGNOSIS — Z79899 Other long term (current) drug therapy: Secondary | ICD-10-CM | POA: Insufficient documentation

## 2023-12-03 DIAGNOSIS — R072 Precordial pain: Secondary | ICD-10-CM | POA: Diagnosis not present

## 2023-12-03 DIAGNOSIS — R0789 Other chest pain: Secondary | ICD-10-CM | POA: Diagnosis not present

## 2023-12-03 HISTORY — DX: Pure hypercholesterolemia, unspecified: E78.00

## 2023-12-03 HISTORY — DX: Postconcussional syndrome: F07.81

## 2023-12-03 HISTORY — DX: Dissociative and conversion disorder, unspecified: F44.9

## 2023-12-03 LAB — BASIC METABOLIC PANEL WITH GFR
Anion gap: 14 (ref 5–15)
BUN: 9 mg/dL (ref 8–23)
CO2: 22 mmol/L (ref 22–32)
Calcium: 9.6 mg/dL (ref 8.9–10.3)
Chloride: 101 mmol/L (ref 98–111)
Creatinine, Ser: 0.58 mg/dL (ref 0.44–1.00)
GFR, Estimated: >60 mL/min (ref >60)
Glucose, Bld: 102 mg/dL — ABNORMAL HIGH (ref 70–99)
Potassium: 4.3 mmol/L (ref 3.5–5.1)
Sodium: 137 mmol/L (ref 135–145)

## 2023-12-03 LAB — CBC
HCT: 43.7 % (ref 36.0–46.0)
Hemoglobin: 15.3 g/dL — ABNORMAL HIGH (ref 12.0–15.0)
MCH: 31.4 pg (ref 26.0–34.0)
MCHC: 35 g/dL (ref 30.0–36.0)
MCV: 89.7 fL (ref 80.0–100.0)
Platelets: 259 K/uL (ref 150–400)
RBC: 4.87 MIL/uL (ref 3.87–5.11)
RDW: 11.9 % (ref 11.5–15.5)
WBC: 4.4 K/uL (ref 4.0–10.5)
nRBC: 0 % (ref 0.0–0.2)

## 2023-12-03 LAB — TROPONIN T, HIGH SENSITIVITY
Troponin T High Sensitivity: <15 ng/L (ref 0–19)
Troponin T High Sensitivity: <15 ng/L (ref 0–19)

## 2023-12-03 NOTE — ED Provider Notes (Signed)
 [place] [facility] Provider Note   CSN: [id number] Arrival date & time: 12/03/23  1019     Patient presents with: Chest Pain   Andrea Blackwell is a 68 y.o. female.   Patient with c/o mid chest pain in past 2-3 days. Dull, non radiating, at rest, comes and goes, lasts minutes at a time. No associated sob, nv or diaphoresis. No exertional cp. Cp is no different whether upright or supine. No hx cad. No fam hx cad. No chest wall injury or strain. No heartburn. No cough or uri symptoms. No fever or chills. No recent surgery or trauma. No hx dvt or pe.   The history is provided by the patient, medical records and a friend.  Chest Pain Associated symptoms: no abdominal pain, no back pain, no cough, no fever, no headache, no nausea, no palpitations, no shortness of breath and no vomiting        Prior to Admission medications   Medication Sig Start Date End Date Taking? Authorizing Provider  Acetaminophen  (TYLENOL ) 325 MG CAPS Take 1 Cartridge by mouth as needed. Patient not taking: Reported on 09/12/2022    [provider]  ALPRAZolam  (XANAX ) 0.5 MG tablet Take 0.5 mg by mouth at bedtime as needed for anxiety.    [provider]  ALPRAZolam  (XANAX ) 0.5 MG tablet Take 1-2 tablets (0.5-1 mg total) by mouth daily as needed. 06/22/23     ALPRAZolam  (XANAX ) 0.5 MG tablet Take 1-2 tablets (0.5-1 mg total) by mouth daily as needed. 10/19/23     Ashwagandha 500 MG CAPS Take 500 mg by mouth daily at 6 (six) AM. Patient not taking: Reported on 09/12/2022    [provider]  Atogepant  (QULIPTA ) 60 MG TABS Take 1 tablet by mouth daily.    [provider]  Atogepant  (QULIPTA ) 60 MG TABS Take 1 tablet (60 mg total) by mouth daily for migraine prevention 02/16/23     Atogepant  (QULIPTA ) 60 MG TABS Take 1 tablet (60 mg total) by mouth daily for migraine prevention. 06/14/23     atorvastatin (LIPITOR) 10 MG tablet Take 10 mg by mouth daily.  08/17/22   [provider]  B Complex Vitamins (VITAMIN B COMPLEX PO) Take 1 tablet by mouth daily at 6 (six) AM.    [provider]  Cholecalciferol (VITAMIN D3) 25 MCG (1000 UT) CAPS Take 1 capsule by mouth daily at 6 (six) AM.    [provider]  CHROMIUM PO Take 1 tablet by mouth daily at 6 (six) AM.    [provider]  Dextromethorphan -quiNIDine (NUEDEXTA ) 20-10 MG capsule Take 1 capsule by mouth 2 (two) times daily. 09/21/23     escitalopram  (LEXAPRO ) 5 MG tablet Take 1 tablet (5 mg total) by mouth daily for 1 week then D/C 04/21/23     fluticasone  (FLONASE ) 50 MCG/ACT nasal spray PLACE 2 SPRAYS INTO BOTH NOSTRILS DAILY. 02/07/19   Fleming, Zelda W, NP  KLAYESTA powder APPLY 1 APPLICATION EXTERNALLY TWICE A DAY FOR 2-4 WEEKS    [provider]  levothyroxine  (LEVOXYL ) 150 MCG tablet Take 150 mcg by mouth daily before breakfast.    [provider]  levothyroxine  (SYNTHROID ) 100 MCG tablet Take 100 mcg by mouth daily before breakfast.    [provider]  levothyroxine  (SYNTHROID ) 88 MCG tablet Take 1 tablet (88 mcg total) by mouth in the morning on an empty stomach. 08/10/23     Multiple Vitamin (MULTIVITAMIN WITH MINERALS)  TABS tablet Take 1 tablet by mouth daily.    [provider]  NUEDEXTA  20-10 MG capsule Take 1 capsule by mouth 2 (two) times daily.    [provider]  propranolol  ER (INDERAL  LA) 60 MG 24 hr capsule Take 60 mg by mouth daily. 01/03/22   [provider]  propranolol  ER (INDERAL  LA) 60 MG 24 hr capsule Take 1 capsule (60 mg total) by mouth daily. 04/07/23     propranolol  ER (INDERAL  LA) 60 MG 24 hr capsule Take 1 capsule (60 mg total) by mouth daily. 05/05/23     propranolol  ER (INDERAL  LA) 60 MG 24 hr capsule Take 1 capsule (60 mg total) by mouth daily. 10/24/23     Rimegepant Sulfate  (NURTEC) 75 MG TBDP Take 75 mg by mouth as needed. 07/21/22   [provider]  Rimegepant Sulfate   (NURTEC) 75 MG TBDP Dissolve 1 tablet (75 mg total) by mouth as needed for breakthrough headache/migraine 05/05/23     Rimegepant Sulfate  (NURTEC) 75 MG TBDP Take 1 tablet (75 mg total) by mouth as needed for breakthrough headache. 09/21/23     rosuvastatin  (CRESTOR ) 20 MG tablet Take 1 tablet (20 mg total) by mouth daily. 11/29/23     rosuvastatin  (CRESTOR ) 5 MG tablet Take 1 tablet (5 mg total) by mouth daily. 09/07/23     saccharomyces boulardii (FLORASTOR) 250 MG capsule Take 250 mg by mouth daily at 6 (six) AM.    [provider]  Semaglutide -Weight Management (WEGOVY ) 0.25 MG/0.5ML SOAJ Inject 0.25 mg into the skin once a week. 09/07/23       Allergies: Penicillins    Review of Systems  Constitutional:  Negative for chills and fever.  HENT:  Negative for sore throat.   Respiratory:  Negative for cough and shortness of breath.   Cardiovascular:  Positive for chest pain. Negative for palpitations and leg swelling.  Gastrointestinal:  Negative for abdominal pain, nausea and vomiting.  Genitourinary:  Negative for flank pain.  Musculoskeletal:  Negative for back pain and neck pain.  Skin:  Negative for rash.  Neurological:  Negative for headaches.    Updated Vital Signs BP 122/66   Pulse 62   Temp 98.2 F (36.8 C) (Oral)   Resp 18   Ht 1.676 m (5' 6)   Wt 99.8 kg   SpO2 100%   BMI 35.51 kg/m   Physical Exam Vitals and nursing note reviewed.  Constitutional:      Appearance: Normal appearance. She is well-developed.  HENT:     Head: Atraumatic.     Nose: Nose normal.     Mouth/Throat:     Mouth: Mucous membranes are moist.  Eyes:     General: No scleral icterus.    Conjunctiva/sclera: Conjunctivae normal.  Neck:     Trachea: No tracheal deviation.  Cardiovascular:     Rate and Rhythm: Normal rate and regular rhythm.     Pulses: Normal pulses.     Heart sounds: Normal heart sounds. No murmur heard.    No friction rub. No gallop.  Pulmonary:     Effort:  Pulmonary effort is normal. No respiratory distress.     Breath sounds: Normal breath sounds.  Chest:     Chest wall: No tenderness.  Abdominal:     General: Bowel sounds are normal. There is no distension.     Palpations: Abdomen is soft. There is no mass.     Tenderness: There is no abdominal tenderness. There  is no guarding.  Genitourinary:    Comments: No cva tenderness.  Musculoskeletal:        General: No swelling or tenderness.     Cervical back: Normal range of motion and neck supple. No rigidity. No muscular tenderness.     Right lower leg: No edema.     Left lower leg: No edema.  Skin:    General: Skin is warm and dry.     Findings: No rash.  Neurological:     Mental Status: She is alert.     Comments: Alert, speech normal.   Psychiatric:        Mood and Affect: Mood normal.     (all labs ordered are listed, but only abnormal results are displayed) Results for orders placed or performed during the hospital encounter of 12/03/23  Basic metabolic panel   Collection Time: 12/03/23 10:43 AM  Result Value Ref Range   Sodium 137 135 - 145 mmol/L   Potassium 4.3 3.5 - 5.1 mmol/L   Chloride 101 98 - 111 mmol/L   CO2 22 22 - 32 mmol/L   Glucose, Bld 102 (H) 70 - 99 mg/dL   BUN 9 8 - 23 mg/dL   Creatinine, Ser 9.41 0.44 - 1.00 mg/dL   Calcium  9.6 8.9 - 10.3 mg/dL   GFR, Estimated >39 >39 mL/min   Anion gap 14 5 - 15  CBC   Collection Time: 12/03/23 10:43 AM  Result Value Ref Range   WBC 4.4 4.0 - 10.5 K/uL   RBC 4.87 3.87 - 5.11 MIL/uL   Hemoglobin 15.3 (H) 12.0 - 15.0 g/dL   HCT 56.2 63.9 - 53.9 %   MCV 89.7 80.0 - 100.0 fL   MCH 31.4 26.0 - 34.0 pg   MCHC 35.0 30.0 - 36.0 g/dL   RDW 88.0 88.4 - 84.4 %   Platelets 259 150 - 400 K/uL   nRBC 0.0 0.0 - 0.2 %  Troponin T, High Sensitivity   Collection Time: 12/03/23 10:43 AM  Result Value Ref Range   Troponin T High Sensitivity <15 0 - 19 ng/L  Troponin T, High Sensitivity   Collection Time: 12/03/23 12:43 PM   Result Value Ref Range   Troponin T High Sensitivity <15 0 - 19 ng/L     EKG: EKG Interpretation Date/Time:  Sunday December 03 2023 10:35:17 EDT Ventricular Rate:  76 PR Interval:  153 QRS Duration:  91 QT Interval:  387 QTC Calculation: 436 R Axis:   -39  Text Interpretation: Sinus rhythm Non-specific ST-t changes No significant change since last tracing Baseline wander Confirmed by Bernard Drivers ([id number]) on 12/03/2023 10:42:56 AM  Radiology: [facility] Chest 2 View Result Date: 12/03/2023 EXAM: 2 VIEW(S) XRAY OF THE CHEST 12/03/2023 11:09:00 AM COMPARISON: None available. CLINICAL HISTORY: chest pain. Pt tearful in triage, feels overwhelmed, states had previous traumatic brain injury and is having trouble with words. ; Pt with intermittent central chest pain that does radiate to her back and both arms. She woke up out of sleep with the pain. Took tylenol  the past few days. Also is feeling a little nauseated. FINDINGS: LUNGS AND PLEURA: No focal pulmonary opacity. No pulmonary edema. No pleural effusion. No pneumothorax. HEART AND MEDIASTINUM: No acute abnormality of the cardiac and mediastinal silhouettes. BONES AND SOFT TISSUES: No acute osseous abnormality. IMPRESSION: 1. No acute cardiopulmonary process. Electronically signed by: Norleen Boxer MD 12/03/2023 11:32 AM EDT RP Workstation: [id number]     Procedures   Medications Ordered  in the ED - No data to display                                  Medical Decision Making Problems Addressed: Precordial chest pain: acute illness or injury with systemic symptoms that poses a threat to life or bodily functions  Amount and/or Complexity of Data Reviewed Independent Historian: friend External Data Reviewed: notes. Labs: ordered. Decision-making details documented in ED Course. Radiology: ordered and independent interpretation performed. Decision-making details documented in ED Course. ECG/medicine tests: ordered and independent  interpretation performed. Decision-making details documented in ED Course.  Risk Decision regarding hospitalization.   Iv ns. Continuous pulse ox and cardiac monitoring. Labs ordered/sent. Imaging ordered.   Differential diagnosis includes acs, msk cp, gi cp, etc. Dispo decision including potential need for admission considered - will get labs and imaging and reassess.   Reviewed nursing notes and prior charts for additional history. External reports reviewed. Additional history from: friend.  Cardiac monitor: sinus rhythm, rate 70.  Xrays reviewed/interpreted by me - no pna.   Labs reviewed/interpreted by me - wbc and hct normal. Chem unremarkable. Trop normal. Delta trop normal and not increasing, felt not c/w acs.   Recheck pt, no chest pain or sob. Pt currently appears stable for ed d/c.   Rec close pcp/card f/u.  Return precautions provided.        Final diagnoses:  None    ED Discharge Orders     None          Bernard Drivers, MD 12/03/23 1437

## 2023-12-03 NOTE — ED Triage Notes (Signed)
 Pt tearful in triage, feels overwhelmed, states had previous traumatic brain injury and is having trouble with words.  Pt with intermittent central chest pain that does radiate to her back and both arms. She woke up out of sleep with the pain. Took tylenol  the past few days. Also is feeling a little nauseated.

## 2023-12-03 NOTE — Discharge Instructions (Signed)
 It was our pleasure to provide your ER care today - we hope that you feel better.  If GI/reflux symptoms, try taking pepcid and maalox as need for symptom relief.   For recent chest pain, follow up closely with cardiologist in the next 1-2 weeks - we made referral, and they should be contacting you with an appointment in the next few days.   Return to ER right away if worse, new symptoms, fevers, recurrent/persistent chest pain, increased trouble breathing, or other concern.

## 2023-12-04 ENCOUNTER — Other Ambulatory Visit (HOSPITAL_COMMUNITY): Payer: Self-pay

## 2023-12-06 ENCOUNTER — Other Ambulatory Visit (HOSPITAL_COMMUNITY): Payer: Self-pay

## 2023-12-06 ENCOUNTER — Other Ambulatory Visit: Payer: Self-pay

## 2023-12-06 MED ORDER — EZETIMIBE 10 MG PO TABS
10.0000 mg | ORAL_TABLET | Freq: Every day | ORAL | 11 refills | Status: AC
Start: 2023-12-06 — End: ?
  Filled 2023-12-06: qty 30, 30d supply, fill #0

## 2023-12-18 ENCOUNTER — Other Ambulatory Visit (HOSPITAL_COMMUNITY): Payer: Self-pay

## 2023-12-18 MED ORDER — PREDNISONE 10 MG PO TABS
20.0000 mg | ORAL_TABLET | Freq: Every day | ORAL | 0 refills | Status: AC
Start: 2023-12-18 — End: ?
  Filled 2023-12-18: qty 10, 5d supply, fill #0

## 2023-12-28 ENCOUNTER — Other Ambulatory Visit (HOSPITAL_COMMUNITY): Payer: Self-pay

## 2023-12-28 MED ORDER — DEXTROMETHORPHAN-QUINIDINE 20-10 MG PO CAPS
1.0000 | ORAL_CAPSULE | Freq: Two times a day (BID) | ORAL | 2 refills | Status: AC
  Filled 2023-12-28: qty 60, 30d supply, fill #0
  Filled 2024-01-29: qty 60, 30d supply, fill #1
  Filled 2024-02-27: qty 60, 30d supply, fill #2

## 2023-12-29 ENCOUNTER — Other Ambulatory Visit: Payer: Self-pay

## 2023-12-29 ENCOUNTER — Other Ambulatory Visit (HOSPITAL_COMMUNITY): Payer: Self-pay

## 2024-01-11 ENCOUNTER — Other Ambulatory Visit (HOSPITAL_COMMUNITY): Payer: Self-pay

## 2024-01-12 ENCOUNTER — Other Ambulatory Visit: Payer: Self-pay

## 2024-01-12 ENCOUNTER — Other Ambulatory Visit (HOSPITAL_COMMUNITY): Payer: Self-pay

## 2024-01-23 ENCOUNTER — Other Ambulatory Visit (HOSPITAL_COMMUNITY): Payer: Self-pay

## 2024-01-24 ENCOUNTER — Ambulatory Visit: Admitting: Family Medicine

## 2024-01-24 ENCOUNTER — Ambulatory Visit

## 2024-01-24 ENCOUNTER — Other Ambulatory Visit: Payer: Self-pay

## 2024-01-24 VITALS — BP 120/60 | HR 84 | Ht 66.0 in

## 2024-01-24 DIAGNOSIS — M546 Pain in thoracic spine: Secondary | ICD-10-CM | POA: Diagnosis not present

## 2024-01-24 DIAGNOSIS — G8929 Other chronic pain: Secondary | ICD-10-CM

## 2024-01-24 DIAGNOSIS — M25511 Pain in right shoulder: Secondary | ICD-10-CM

## 2024-01-24 DIAGNOSIS — M542 Cervicalgia: Secondary | ICD-10-CM | POA: Diagnosis not present

## 2024-01-24 NOTE — Patient Instructions (Addendum)
 Thank you for coming in today.   Please get an Xray today before you leave   I've referred you to Physical Therapy.  Let us know if you don't hear from them in one week.   Check back in 2 months

## 2024-01-24 NOTE — Progress Notes (Signed)
 I, Claretha Schimke am a scribe for Dr. Artist Lloyd, MD.  Andrea Blackwell is a 68 y.o. female who presents to [facility] at [facility] today for neck/R shoulder pain. Pt was previously seen by Dr. Leonce on 03/14/22 for a head injury.  Today, pt c/o neck/R shoulder pain x been about seven to eight weeks ago. Pt locates pain to in both shoulders. Twice a day tylenol . Was taking four a day. Pain comes out of nowhere. She will be fine one moment and severe pain the next. No injury to the shoulders this time around. Has trouble holding things from time to time. Looked up exercises on the internet  from physical therapist and they seem to help. Mostly stretches. Went to physical therapy for balance for six months. Pain from the back through to her chest, down her arms and into her elbows recently and the sent her to the ER thinking that she was having a heart attack. Heating pad has helped.   Radiates: yes UE Numbness/tingling:no UE Weakness:no Aggravates: no, gripping things Treatments tried: tylenol , heat   Dx imaging: 03/12/22 C-spine CT  Pertinent review of systems: No fevers or chills  Relevant historical information: Fall history, previous shoulder injuries, Sprained back and neck in the past, Brain injury history Pseudobulbar affect  Exam:  BP 120/60   Pulse 84   Ht 5' 6 (1.676 m)   SpO2 96%   BMI 35.51 kg/m  General: Well Developed, well nourished, and in no acute distress.   MSK: C-spine: Normal-appearing Nontender palpation spinal midline. Normal cervical motion upper Smir strength is intact.  Reflexes are intact. T-spine nontender palpation spinal midline.  Tender palpation thoracic paraspinal musculature.    Lab and Radiology Results  X-ray images C-spine and T-spine obtained today personally and independently interpreted.  C-spine: Mild degenerative changes.  No acute fractures are visible.  T-spine: Multilevel degenerative changes.  No acute  fractures are visible.  Await formal radiology review     Assessment and Plan: 68 y.o. female with chronic neck and upper back pain.  Pain predominantly due to muscle dysfunction in this area.  Thoracic radiculopathy is a possibility but is less likely.  Referred pain from cervical degeneration is a possibility as well.  Plan for trial of physical therapy.  Will obtain x-rays today.  Recheck in 2 months if not improved consider MRI C-spine and T-spine.  [facility] on [address] and had a good experience and would like to go back. PDMP not reviewed this encounter. Orders Placed This Encounter  Procedures   DG Cervical Spine 2 or 3 views    Standing Status:   Future    Number of Occurrences:   1    Expiration Date:   01/23/2025    Reason for Exam (SYMPTOM  OR DIAGNOSIS REQUIRED):   neck pain    Preferred imaging location?:   [facility] Thoracic Spine W/Swimm[facility]    Standing Status:   Future    Number of Occurrences:   1    Expiration Date:   01/23/2025    Reason for Exam (SYMPTOM  OR DIAGNOSIS REQUIRED):   thoracic back pain    Preferred imaging location?:   [facility]   Ambulatory referral to Physical Therapy    Referral Priority:   Routine    Referral Type:   Physical Medicine    Referral Reason:   Specialty Services Required  Requested Specialty:   Physical Therapy    Number of Visits Requested:   1   No orders of the defined types were placed in this encounter.    Discussed warning signs or symptoms. Please see discharge instructions. Patient expresses understanding.   The above documentation has been reviewed and is accurate and complete Artist Lloyd, M.D.

## 2024-01-26 ENCOUNTER — Other Ambulatory Visit (HOSPITAL_COMMUNITY): Payer: Self-pay

## 2024-01-26 ENCOUNTER — Other Ambulatory Visit: Payer: Self-pay

## 2024-01-27 ENCOUNTER — Other Ambulatory Visit (HOSPITAL_COMMUNITY): Payer: Self-pay

## 2024-01-29 ENCOUNTER — Other Ambulatory Visit (HOSPITAL_COMMUNITY): Payer: Self-pay

## 2024-01-29 MED ORDER — LEVOTHYROXINE SODIUM 88 MCG PO TABS
ORAL_TABLET | ORAL | 1 refills | Status: AC
  Filled 2024-01-29: qty 90, 90d supply, fill #0

## 2024-01-30 ENCOUNTER — Other Ambulatory Visit: Payer: Self-pay

## 2024-01-30 ENCOUNTER — Other Ambulatory Visit: Payer: Self-pay | Admitting: Medical Genetics

## 2024-01-31 ENCOUNTER — Other Ambulatory Visit (HOSPITAL_COMMUNITY): Payer: Self-pay

## 2024-02-08 ENCOUNTER — Ambulatory Visit: Payer: Self-pay | Admitting: Family Medicine

## 2024-02-08 NOTE — Progress Notes (Signed)
 Thoracic spine x-ray shows mild arthritis similar in appearance to x-rays from 2020

## 2024-02-08 NOTE — Progress Notes (Signed)
 Cervical spine x-ray there is arthritis.  This looks similar to how it did in 2020

## 2024-02-09 ENCOUNTER — Other Ambulatory Visit (HOSPITAL_COMMUNITY): Payer: Self-pay

## 2024-02-09 ENCOUNTER — Other Ambulatory Visit: Payer: Self-pay

## 2024-02-09 MED ORDER — EZETIMIBE 10 MG PO TABS
10.0000 mg | ORAL_TABLET | Freq: Every day | ORAL | 11 refills | Status: AC
  Filled 2024-02-09: qty 30, 30d supply, fill #0
  Filled 2024-03-06: qty 30, 30d supply, fill #1

## 2024-02-12 ENCOUNTER — Other Ambulatory Visit: Payer: Self-pay

## 2024-02-12 ENCOUNTER — Other Ambulatory Visit (HOSPITAL_BASED_OUTPATIENT_CLINIC_OR_DEPARTMENT_OTHER): Payer: Self-pay

## 2024-02-12 ENCOUNTER — Other Ambulatory Visit (HOSPITAL_COMMUNITY): Payer: Self-pay

## 2024-02-12 MED ORDER — AZITHROMYCIN 250 MG PO TABS
ORAL_TABLET | ORAL | 2 refills | Status: AC
  Filled 2024-02-12: qty 6, 5d supply, fill #0
  Filled 2024-03-06: qty 6, 5d supply, fill #1

## 2024-02-27 ENCOUNTER — Other Ambulatory Visit (HOSPITAL_COMMUNITY): Payer: Self-pay

## 2024-02-28 ENCOUNTER — Other Ambulatory Visit (HOSPITAL_COMMUNITY): Payer: Self-pay

## 2024-02-29 ENCOUNTER — Other Ambulatory Visit (HOSPITAL_COMMUNITY): Payer: Self-pay

## 2024-03-01 ENCOUNTER — Other Ambulatory Visit (HOSPITAL_COMMUNITY): Payer: Self-pay

## 2024-03-01 ENCOUNTER — Other Ambulatory Visit: Payer: Self-pay

## 2024-03-01 MED ORDER — REPATHA SURECLICK 140 MG/ML ~~LOC~~ SOAJ
140.0000 mg | SUBCUTANEOUS | 3 refills | Status: AC
  Filled 2024-03-01: qty 6, 84d supply, fill #0

## 2024-03-06 ENCOUNTER — Other Ambulatory Visit (HOSPITAL_COMMUNITY): Payer: Self-pay

## 2024-03-06 ENCOUNTER — Other Ambulatory Visit: Payer: Self-pay

## 2024-03-06 MED ORDER — RIMEGEPANT SULFATE 75 MG PO TBDP
75.0000 mg | ORAL_TABLET | ORAL | 0 refills | Status: AC | PRN
  Filled 2024-03-06: qty 18, 30d supply, fill #0

## 2024-03-07 ENCOUNTER — Other Ambulatory Visit (HOSPITAL_COMMUNITY): Payer: Self-pay

## 2024-03-07 ENCOUNTER — Other Ambulatory Visit: Payer: Self-pay

## 2024-03-07 MED ORDER — SULFAMETHOXAZOLE-TRIMETHOPRIM 800-160 MG PO TABS
1.0000 | ORAL_TABLET | Freq: Two times a day (BID) | ORAL | 0 refills | Status: AC
  Filled 2024-03-07 – 2024-03-09 (×2): qty 14, 7d supply, fill #0

## 2024-03-08 ENCOUNTER — Other Ambulatory Visit (HOSPITAL_COMMUNITY): Payer: Self-pay

## 2024-03-09 ENCOUNTER — Other Ambulatory Visit (HOSPITAL_COMMUNITY): Payer: Self-pay

## 2024-03-10 ENCOUNTER — Other Ambulatory Visit: Payer: Self-pay

## 2024-03-11 ENCOUNTER — Other Ambulatory Visit: Payer: Self-pay

## 2024-03-14 ENCOUNTER — Other Ambulatory Visit: Payer: Self-pay

## 2024-03-15 ENCOUNTER — Ambulatory Visit

## 2024-03-26 ENCOUNTER — Ambulatory Visit: Admitting: Family Medicine

## 2024-06-19 ENCOUNTER — Ambulatory Visit: Admitting: Podiatry
